# Patient Record
Sex: Male | Born: 1940
Health system: Southern US, Community
[De-identification: ages and names within clinical notes are randomized; demographics above are authoritative.]

## PROBLEM LIST (undated history)

## (undated) DIAGNOSIS — D649 Anemia, unspecified: Secondary | ICD-10-CM

## (undated) DIAGNOSIS — I2699 Other pulmonary embolism without acute cor pulmonale: Secondary | ICD-10-CM

## (undated) DIAGNOSIS — J449 Chronic obstructive pulmonary disease, unspecified: Secondary | ICD-10-CM

## (undated) DIAGNOSIS — E7800 Pure hypercholesterolemia, unspecified: Secondary | ICD-10-CM

## (undated) DIAGNOSIS — C679 Malignant neoplasm of bladder, unspecified: Secondary | ICD-10-CM

## (undated) DIAGNOSIS — I10 Essential (primary) hypertension: Secondary | ICD-10-CM

## (undated) DIAGNOSIS — I499 Cardiac arrhythmia, unspecified: Secondary | ICD-10-CM

## (undated) DIAGNOSIS — M1990 Unspecified osteoarthritis, unspecified site: Secondary | ICD-10-CM

## (undated) HISTORY — PX: CRANIOTOMY: SHX93

## (undated) HISTORY — PX: BLADDER SURGERY: SHX569

## (undated) HISTORY — DX: Anemia, unspecified: D64.9

## (undated) HISTORY — PX: DG KNEE LEFT COMPLETE (ARMC HX): HXRAD1556

---

## 1999-04-21 ENCOUNTER — Emergency Department (HOSPITAL_COMMUNITY): Admission: EM | Admit: 1999-04-21 | Discharge: 1999-04-21 | Payer: Self-pay | Admitting: Emergency Medicine

## 1999-04-21 ENCOUNTER — Encounter: Payer: Self-pay | Admitting: Emergency Medicine

## 2007-11-14 ENCOUNTER — Encounter: Admission: RE | Admit: 2007-11-14 | Discharge: 2007-11-14 | Payer: Self-pay | Admitting: Rheumatology

## 2008-01-03 ENCOUNTER — Inpatient Hospital Stay (HOSPITAL_COMMUNITY): Admission: RE | Admit: 2008-01-03 | Discharge: 2008-01-08 | Payer: Self-pay | Admitting: Orthopaedic Surgery

## 2008-03-30 ENCOUNTER — Emergency Department (HOSPITAL_BASED_OUTPATIENT_CLINIC_OR_DEPARTMENT_OTHER): Admission: EM | Admit: 2008-03-30 | Discharge: 2008-03-30 | Payer: Self-pay | Admitting: Emergency Medicine

## 2008-09-07 ENCOUNTER — Ambulatory Visit: Payer: Self-pay | Admitting: Diagnostic Radiology

## 2008-09-07 ENCOUNTER — Emergency Department (HOSPITAL_BASED_OUTPATIENT_CLINIC_OR_DEPARTMENT_OTHER): Admission: EM | Admit: 2008-09-07 | Discharge: 2008-09-07 | Payer: Self-pay | Admitting: Emergency Medicine

## 2010-02-15 ENCOUNTER — Ambulatory Visit: Payer: Self-pay | Admitting: Diagnostic Radiology

## 2010-02-15 ENCOUNTER — Emergency Department (HOSPITAL_BASED_OUTPATIENT_CLINIC_OR_DEPARTMENT_OTHER): Admission: EM | Admit: 2010-02-15 | Discharge: 2010-02-15 | Payer: Self-pay | Admitting: Emergency Medicine

## 2010-09-17 LAB — BASIC METABOLIC PANEL
BUN: 14 mg/dL (ref 6–23)
CO2: 27 mEq/L (ref 19–32)
Calcium: 9.4 mg/dL (ref 8.4–10.5)
Chloride: 104 mEq/L (ref 96–112)
Creatinine, Ser: 1.2 mg/dL (ref 0.4–1.5)
GFR calc Af Amer: >60 mL/min (ref >60)
GFR calc non Af Amer: >60 mL/min (ref >60)
Glucose, Bld: 99 mg/dL (ref 70–99)
Potassium: 4.2 mEq/L (ref 3.5–5.1)
Sodium: 140 mEq/L (ref 135–145)

## 2010-09-17 LAB — DIFFERENTIAL
Basophils Absolute: 0 10*3/uL (ref 0.0–0.1)
Basophils Relative: 0 % (ref 0–1)
Eosinophils Absolute: 0.1 10*3/uL (ref 0.0–0.7)
Eosinophils Relative: 2 % (ref 0–5)
Lymphocytes Relative: 9 % — ABNORMAL LOW (ref 12–46)
Lymphs Abs: 0.7 10*3/uL (ref 0.7–4.0)
Monocytes Absolute: 0.6 10*3/uL (ref 0.1–1.0)
Monocytes Relative: 8 % (ref 3–12)
Neutro Abs: 6.5 10*3/uL (ref 1.7–7.7)
Neutrophils Relative %: 82 % — ABNORMAL HIGH (ref 43–77)

## 2010-09-17 LAB — CBC
HCT: 43.2 % (ref 39.0–52.0)
Hemoglobin: 15 g/dL (ref 13.0–17.0)
MCH: 31.5 pg (ref 26.0–34.0)
MCHC: 34.8 g/dL (ref 30.0–36.0)
MCV: 90.4 fL (ref 78.0–100.0)
Platelets: 180 10*3/uL (ref 150–400)
RBC: 4.78 MIL/uL (ref 4.22–5.81)
RDW: 13.3 % (ref 11.5–15.5)
WBC: 7.9 10*3/uL (ref 4.0–10.5)

## 2010-09-17 LAB — POCT CARDIAC MARKERS
CKMB, poc: 2.8 ng/mL (ref 1.0–8.0)
Myoglobin, poc: 384 ng/mL (ref 12–200)
Troponin i, poc: <0.05 ng/mL (ref 0.00–0.09)
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
## 2010-11-16 NOTE — Op Note (Signed)
NAMENEVIN, GRIZZLE                ACCOUNT NO.:  [id number]   MEDICAL RECORD NO.:  [id number]          PATIENT TYPE:  INP   LOCATION:  5001                         FACILITY:  [facility]   PHYSICIAN:  Claude Manges. Whitfield, M.D.DATE OF BIRTH:  Nov 20, 1940   DATE OF PROCEDURE:  01/03/2008  DATE OF DISCHARGE:                               OPERATIVE REPORT   PREOPERATIVE DIAGNOSIS:  Rheumatoid arthritis/osteoarthritis, left knee  (end-stage).   POSTOPERATIVE DIAGNOSIS:  Rheumatoid arthritis/osteoarthritis, left knee  (end-stage).   PROCEDURE:  Left total knee replacement with computer navigation.   SURGEON:  Claude Manges. Cleophas Dunker, MD   ASSISTANT:  Oris Drone. Petrarca, PAC   ANESTHESIA:  General with supplemental femoral nerve block.   COMPLICATIONS:  None.   COMPONENTS:  DePuy LCS large femoral component #4, rotating keeled  tibial tray, 10-mm bridging bearing and metal backed three peg rotating  patella.  All were secured with polymethyl methacrylate.   PROCEDURE:  The patient was met in the preoperative holding area.  Any  questions were answered.  The left lower extremity was marked as the  appropriate operative extremity.   The patient was then taken to room #4 where he was placed under general  orotracheal anesthesia without difficulty.  Nursing staff inserted a  Foley catheter.  The urine was clear.   Tourniquet was applied to the left lower extremity.  It was then prepped  with Betadine scrub and DuraPrep and the tourniquet to the midfoot.  Sterile draping was performed.  With the extremity still elevated, it  was Esmarch exsanguinated with a proximal tourniquet at 350 mmHg.   A midline longitudinal incision was made centered about the patella  extending from the superior pouch to the tibial tubercle via a sharp  dissection.  Incision was carried down to the subcutaneous tissue.  The  first layer of capsule was incised in the midline and medial  parapatellar incision was then made  with a Bovie.  There was a clear  yellow joint effusion.   Patella was everted 180 degrees and knee flexed to 90 degrees.  There  was complete absence of articular cartilage in the majority of the  medial and lateral femoral condyle as well as the both tibial plateaus.  There was diffuse rheumatoid-appearing synovitis.  Synovectomy was  performed.  A rongeur was used to remove the osteophytes.  I did  template a large femoral component.  This was confirmed intraoperatively  with a trial component as well as with the computer.  We also measured a  #4 tibial tray preoperatively.  This was confirmed intraoperatively.   Computer navigation was employed.  Two Shantz pins were then placed x2  in the proximal tibia and x2 in the distal femur.  The computer arrays  were then applied.   Computer was employed to obtain the normal anatomic axis as well as  morphed points on the femur and tibia.  Based on the computed points,  initial cut was made transversely in the proximal tibia where there is 8-  degree posterior angle declination.  We confirmed our cut with the  Probation officer.  The flexion-extension gaps were then applied to  obtain.   Again, based on computer determination, subsequent cuts were then made  on the femur.  Flexion-extension gaps were symmetrical at 10 mm.  Lamina  spreader was inserted into the medial and lateral compartments.  Medial  and lateral menisci were excised as well as ACL and PCL.  There were  three large osteochondral loose bodies measuring over a centimeter in  diameter removed from the posterior pouch.  Any synovitis was also  performed.  Osteotome was used to remove posterior osteophytes.   The final femoral cut was then made for tapering and for the distal  femoral holes to secure the prosthesis.   Retractor was then placed about the tibia.  There was a large 1 x 1.5 cm  posterior tibial cyst.  This was debrided, drilled, and then packed with  impacted  cancellus bone.  We measured a #4 tibial tray.  The tibial 10  plate was then applied.  Center hole was then made followed by the  keeled cut with the tibial tray in place.  A 10-mm bridging bearing was  applied followed by the trial for large femoral component.  We had  excellent fit.  We were within 1 degree of normal anatomic axis.  No  opening with varus or valgus stress, negative anterior drawer sign, and  there was about 1 degree of flexion.   The patella was then prepared by removing 12 mm of bone leaving 13 mm of  patella thickness.  Three pegged jig was applied to make the three holes  secure the patella.  The trial patella was applied through full range of  motion and remained in the groove.   The trial components were removed.  The joint was copiously irrigated  with jet saline.  Marcaine with epinephrine was injected into the deep  capsule.  Final components were then carefully impacted with polymethyl  methacrylate.  We initially applied the tibia followed by the femur and  then the patella and the 10-mm bridging bearing any extraneous  methacrylate was removed.  With the knee in extension, we applied  FloSeal to the wound for postoperative hemostasis.  At 10 minutes, the  tourniquet was deflated.  Any small bleeders were Bovie coagulated with  a very nice dry feel.  The wound was then gently irrigated with bulb  saline.   The deep capsule was closed with interrupted #1 Ethibond.  Superficial  capsule closed with a running 0 Vicryl and subcu with 0 and 2-0 Vicryl.  Skin closed with skin clips.  Sterile bulky dressing was applied  followed by the patient's compressive stocking.   The patient tolerated the procedure without complications.      Claude Manges. Cleophas Dunker, M.D.  Electronically Signed     PWW/MEDQ  D:  01/03/2008  T:  01/04/2008  Job:  [id number]

## 2011-03-31 LAB — URINALYSIS, ROUTINE W REFLEX MICROSCOPIC
Bilirubin Urine: NEGATIVE
Bilirubin Urine: NEGATIVE
Glucose, UA: NEGATIVE
Glucose, UA: NEGATIVE
Hgb urine dipstick: NEGATIVE
Hgb urine dipstick: NEGATIVE
Ketones, ur: NEGATIVE
Ketones, ur: NEGATIVE
Nitrite: NEGATIVE
Nitrite: NEGATIVE
Protein, ur: NEGATIVE
Protein, ur: NEGATIVE
Specific Gravity, Urine: 1.017
Specific Gravity, Urine: >1.046 — ABNORMAL HIGH
Urobilinogen, UA: 0.2
Urobilinogen, UA: 1
pH: 5.5
pH: 7.5

## 2011-03-31 LAB — CROSSMATCH
ABO/RH(D): A POS
Antibody Screen: NEGATIVE

## 2011-03-31 LAB — URINE CULTURE
Colony Count: NO GROWTH
Colony Count: NO GROWTH
Culture: NO GROWTH
Culture: NO GROWTH

## 2011-03-31 LAB — BASIC METABOLIC PANEL
BUN: 10
BUN: 11
BUN: 11
BUN: 14
BUN: 14
CO2: 25
CO2: 25
CO2: 26
CO2: 29
CO2: 31
Calcium: 8.6
Calcium: 8.7
Calcium: 8.7
Calcium: 8.8
Calcium: 9.1
Chloride: 88 — ABNORMAL LOW
Chloride: 92 — ABNORMAL LOW
Chloride: 95 — ABNORMAL LOW
Chloride: 97
Chloride: 98
Creatinine, Ser: 0.95
Creatinine, Ser: 0.95
Creatinine, Ser: 1.02
Creatinine, Ser: 1.03
Creatinine, Ser: 1.11
GFR calc Af Amer: >60
GFR calc Af Amer: >60
GFR calc Af Amer: >60
GFR calc Af Amer: >60
GFR calc Af Amer: >60
GFR calc non Af Amer: >60
GFR calc non Af Amer: >60
GFR calc non Af Amer: >60
GFR calc non Af Amer: >60
GFR calc non Af Amer: >60
Glucose, Bld: 116 — ABNORMAL HIGH
Glucose, Bld: 118 — ABNORMAL HIGH
Glucose, Bld: 123 — ABNORMAL HIGH
Glucose, Bld: 148 — ABNORMAL HIGH
Glucose, Bld: 160 — ABNORMAL HIGH
Potassium: 3.5
Potassium: 3.7
Potassium: 4.1
Potassium: 4.1
Potassium: 4.4
Sodium: 122 — ABNORMAL LOW
Sodium: 124 — ABNORMAL LOW
Sodium: 131 — ABNORMAL LOW
Sodium: 133 — ABNORMAL LOW
Sodium: 135

## 2011-03-31 LAB — CBC
HCT: 45.2
HCT: 26.3 — ABNORMAL LOW
HCT: 27.8 — ABNORMAL LOW
HCT: 28.3 — ABNORMAL LOW
HCT: 32.3 — ABNORMAL LOW
HCT: 37.4 — ABNORMAL LOW
Hemoglobin: 15.7
Hemoglobin: 10 — ABNORMAL LOW
Hemoglobin: 11.4 — ABNORMAL LOW
Hemoglobin: 13.1
Hemoglobin: 9.2 — ABNORMAL LOW
Hemoglobin: 9.8 — ABNORMAL LOW
MCHC: 34.8
MCHC: 35
MCHC: 35.1
MCHC: 35.2
MCHC: 35.3
MCHC: 35.5
MCV: 89.6
MCV: 89
MCV: 89
MCV: 89.3
MCV: 89.7
MCV: 89.8
Platelets: 233
Platelets: 185
Platelets: 197
Platelets: 231
Platelets: 250
Platelets: 265
RBC: 5.05
RBC: 2.93 — ABNORMAL LOW
RBC: 3.12 — ABNORMAL LOW
RBC: 3.17 — ABNORMAL LOW
RBC: 3.6 — ABNORMAL LOW
RBC: 4.21 — ABNORMAL LOW
RDW: 13.6
RDW: 13.5
RDW: 13.5
RDW: 13.7
RDW: 13.8
RDW: 14.2
WBC: 7
WBC: 11.9 — ABNORMAL HIGH
WBC: 14.3 — ABNORMAL HIGH
WBC: 14.3 — ABNORMAL HIGH
WBC: 7.4
WBC: 8.6

## 2011-03-31 LAB — DIFFERENTIAL
Basophils Absolute: 0
Basophils Relative: 0
Eosinophils Absolute: 0.1
Eosinophils Relative: 2
Lymphocytes Relative: 27
Lymphs Abs: 1.9
Monocytes Absolute: 0.5
Monocytes Relative: 8
Neutro Abs: 4.4
Neutrophils Relative %: 63

## 2011-03-31 LAB — PROTIME-INR
INR: 0.9
INR: 1.1
INR: 1.9 — ABNORMAL HIGH
INR: 2.1 — ABNORMAL HIGH
INR: 2.2 — ABNORMAL HIGH
INR: 2.3 — ABNORMAL HIGH
Prothrombin Time: 12.5
Prothrombin Time: 14.5
Prothrombin Time: 22.1 — ABNORMAL HIGH
Prothrombin Time: 24.4 — ABNORMAL HIGH
Prothrombin Time: 24.8 — ABNORMAL HIGH
Prothrombin Time: 25.6 — ABNORMAL HIGH

## 2011-03-31 LAB — ABO/RH: ABO/RH(D): A POS

## 2011-03-31 LAB — COMPREHENSIVE METABOLIC PANEL
ALT: 22
AST: 22
Albumin: 3.9
Alkaline Phosphatase: 69
BUN: 11
CO2: 29
Calcium: 9.9
Chloride: 104
Creatinine, Ser: 1.13
GFR calc Af Amer: >60
GFR calc non Af Amer: >60
Glucose, Bld: 95
Potassium: 4.8
Sodium: 140
Total Bilirubin: 0.9
Total Protein: 7

## 2011-03-31 LAB — URINE MICROSCOPIC-ADD ON

## 2011-03-31 LAB — APTT: aPTT: 28

## 2011-03-31 LAB — SODIUM: Sodium: 125 — ABNORMAL LOW

## 2014-07-06 ENCOUNTER — Encounter (HOSPITAL_BASED_OUTPATIENT_CLINIC_OR_DEPARTMENT_OTHER): Payer: Self-pay

## 2014-07-06 ENCOUNTER — Emergency Department (HOSPITAL_BASED_OUTPATIENT_CLINIC_OR_DEPARTMENT_OTHER): Payer: Medicare HMO

## 2014-07-06 ENCOUNTER — Emergency Department (HOSPITAL_BASED_OUTPATIENT_CLINIC_OR_DEPARTMENT_OTHER)
Admission: EM | Admit: 2014-07-06 | Discharge: 2014-07-06 | Disposition: A | Discharge status: Home / Self Care | Payer: Medicare HMO | Attending: Emergency Medicine | Admitting: Emergency Medicine

## 2014-07-06 DIAGNOSIS — M549 Dorsalgia, unspecified: Secondary | ICD-10-CM | POA: Diagnosis present

## 2014-07-06 DIAGNOSIS — I10 Essential (primary) hypertension: Secondary | ICD-10-CM | POA: Diagnosis not present

## 2014-07-06 DIAGNOSIS — M5136 Other intervertebral disc degeneration, lumbar region: Secondary | ICD-10-CM | POA: Insufficient documentation

## 2014-07-06 DIAGNOSIS — Z87891 Personal history of nicotine dependence: Secondary | ICD-10-CM | POA: Diagnosis not present

## 2014-07-06 DIAGNOSIS — J449 Chronic obstructive pulmonary disease, unspecified: Secondary | ICD-10-CM | POA: Diagnosis not present

## 2014-07-06 DIAGNOSIS — E780 Pure hypercholesterolemia: Secondary | ICD-10-CM | POA: Insufficient documentation

## 2014-07-06 DIAGNOSIS — Z79899 Other long term (current) drug therapy: Secondary | ICD-10-CM | POA: Insufficient documentation

## 2014-07-06 DIAGNOSIS — R52 Pain, unspecified: Secondary | ICD-10-CM

## 2014-07-06 DIAGNOSIS — M479 Spondylosis, unspecified: Secondary | ICD-10-CM

## 2014-07-06 HISTORY — DX: Pure hypercholesterolemia, unspecified: E78.00

## 2014-07-06 HISTORY — DX: Essential (primary) hypertension: I10

## 2014-07-06 HISTORY — DX: Chronic obstructive pulmonary disease, unspecified: J44.9

## 2014-07-06 MED ORDER — OXYCODONE-ACETAMINOPHEN 7.5-325 MG PO TABS: 1.0000 | ORAL_TABLET | ORAL | Status: DC | PRN

## 2014-07-06 NOTE — ED Provider Notes (Signed)
CSN: [id number]     Arrival date & time 07/06/14  1629 History  This chart was scribed for Dot Lanes, MD by Lowella Petties, ED Scribe. The patient was seen in room MHT13/MHT13. Patient's care was started at 6:06 PM.   Chief Complaint  Patient presents with  . Back Pain   The history is provided by the patient. No language interpreter was used.   HPI Comments: Victor Cooper is a 74 y.o. male who presents to the Emergency Department complaining of constant, moderate, back pain which began yesterday evening. He states that the pain is localized to "one little spot" in his left lower back. He states that the pain is exacerbated by any change in position. He states that the pain can be relieved by laying standing in a certain position. He denies any injury. He reports taking 3 Advil twice since last night without relief. He denies any history of kidney problems.   PCP: Dr. Charissa Bash with [facility]  Past Medical History  Diagnosis Date  . Hypertension   . High cholesterol   . COPD (chronic obstructive pulmonary disease)    History reviewed. No pertinent past surgical history. No family history on file. History  Substance Use Topics  . Smoking status: Former Research scientist (life sciences)  . Smokeless tobacco: Not on file  . Alcohol Use: Not on file    Review of Systems  Musculoskeletal: Positive for back pain.  All other systems reviewed and are negative.   Allergies  Review of patient's allergies indicates no known allergies.  Home Medications   Prior to Admission medications   Medication Sig Start Date End Date Taking? Authorizing Provider  olmesartan (BENICAR) 20 MG tablet Take 20 mg by mouth daily.   Yes Historical Provider, MD  rosuvastatin (CRESTOR) 40 MG tablet Take 40 mg by mouth daily. 3 times/week   Yes Historical Provider, MD  oxyCODONE-acetaminophen (PERCOCET) 7.5-325 MG per tablet Take 1 tablet by mouth every 4 (four) hours as needed for pain. 07/06/14   Dot Lanes,  MD   Triage Vitals: BP 164/81 mmHg  Pulse 69  Temp(Src) 98.2 F (36.8 C) (Oral)  Resp 20  SpO2 99% Physical Exam  Constitutional: He is oriented to person, place, and time. He appears well-developed and well-nourished. No distress.  HENT:  Head: Normocephalic and atraumatic.  Eyes: Pupils are equal, round, and reactive to light.  Neck: Normal range of motion.  Cardiovascular: Normal rate and intact distal pulses.   Pulmonary/Chest: No respiratory distress.  Abdominal: Normal appearance. He exhibits no distension.  Musculoskeletal: Normal range of motion. He exhibits tenderness.       Back:  Neurological: He is alert and oriented to person, place, and time. No cranial nerve deficit.  Skin: Skin is warm and dry. No rash noted.  Psychiatric: He has a normal mood and affect. His behavior is normal.  Nursing note and vitals reviewed.   ED Course  Procedures (including critical care time) DIAGNOSTIC STUDIES: Oxygen Saturation is 99% on room air, normal by my interpretation.    COORDINATION OF CARE: 6:11 PM-Discussed treatment plan which includes OTC Ibuprofen, pain medication, f/u w/ PCP with pt at bedside and pt agreed to plan.   Labs Review Labs Reviewed - No data to display  Imaging Review Dg Lumbar Spine Complete  07/06/2014   CLINICAL DATA:  One day history of low back pain radiating into the left posterior thigh. Remote injury in 2009. No recent injuries.  EXAM: LUMBAR  SPINE - COMPLETE 4+ VIEW  COMPARISON:  None.  FINDINGS: Five non rib-bearing lumbar vertebrae. Grade 1 spondylolisthesis of L4 on L5 approximating 6 mm due to severe facet degenerative changes at this level. No acute or remote fractures. Marked disc space narrowing at L4-5 and L5-S1. Moderate disc space narrowing at L2-3 and L3-4. Anterior bridging osteophytes at multiple levels. Facet degenerative changes bilaterally at L5-S1 in addition to L4-5. Lower thoracic spondylosis. Visualized sacroiliac joints intact.  Aortoiliac atherosclerosis without aneurysm.  IMPRESSION: 1. No evidence of acute or remote fractures. 2. Degenerative grade 1 spondylolisthesis of L4 on L5. 3. Severe degenerative disc disease and spondylosis at L4-5 and L5-S1. Moderate degenerative disc disease and spondylosis at L2-3 and L3-4.   Electronically Signed   By: Evangeline Dakin M.D.   On: 07/06/2014 17:36      MDM   Final diagnoses:  Pain  Degenerative joint disease of low back     I personally performed the services described in this documentation, which was scribed in my presence. The recorded information has been reviewed and considered.    Dot Lanes, MD 07/06/14 [id number]

## 2014-07-06 NOTE — ED Notes (Signed)
Patient transported to X-ray 

## 2014-07-06 NOTE — Discharge Instructions (Signed)

## 2014-07-06 NOTE — ED Notes (Signed)
Patient here with lower back pain x 1 day, pain with any change in position, denies injury

## 2015-10-04 ENCOUNTER — Emergency Department (HOSPITAL_BASED_OUTPATIENT_CLINIC_OR_DEPARTMENT_OTHER)
Admission: EM | Admit: 2015-10-04 | Discharge: 2015-10-05 | Disposition: A | Discharge status: Home / Self Care | Payer: Medicare HMO | Attending: Emergency Medicine | Admitting: Emergency Medicine

## 2015-10-04 ENCOUNTER — Encounter (HOSPITAL_BASED_OUTPATIENT_CLINIC_OR_DEPARTMENT_OTHER): Payer: Self-pay | Admitting: Emergency Medicine

## 2015-10-04 DIAGNOSIS — M25512 Pain in left shoulder: Secondary | ICD-10-CM | POA: Insufficient documentation

## 2015-10-04 DIAGNOSIS — I10 Essential (primary) hypertension: Secondary | ICD-10-CM | POA: Insufficient documentation

## 2015-10-04 DIAGNOSIS — M62838 Other muscle spasm: Secondary | ICD-10-CM

## 2015-10-04 DIAGNOSIS — J449 Chronic obstructive pulmonary disease, unspecified: Secondary | ICD-10-CM | POA: Insufficient documentation

## 2015-10-04 DIAGNOSIS — Z87891 Personal history of nicotine dependence: Secondary | ICD-10-CM | POA: Insufficient documentation

## 2015-10-04 DIAGNOSIS — Z79899 Other long term (current) drug therapy: Secondary | ICD-10-CM | POA: Diagnosis not present

## 2015-10-04 DIAGNOSIS — E7800 Pure hypercholesterolemia, unspecified: Secondary | ICD-10-CM | POA: Diagnosis not present

## 2015-10-04 DIAGNOSIS — M542 Cervicalgia: Secondary | ICD-10-CM | POA: Diagnosis present

## 2015-10-04 NOTE — ED Provider Notes (Signed)
TIME SEEN: 11:59 PM  CHIEF COMPLAINT:  Chief Complaint  Patient presents with  . Neck Pain   HPI:  HPI Comments: Victor Cooper is a 75 y.o. male who is right hand dominant, with a pmhx of degenerative disk disease, HTN, HLD, and COPD, who presents to the Emergency Department complaining of constant, left sided neck pain with radiation to under the left arm that began last night and woke him up around 4AM. He states that he almost fell down when he tried to wake up because of the severe pain. Pain is exacerbated by movementAnd palpation. Pt states no ameliorating factors. Pt has tried muscle sprays, and ibuprofen with no relief. Pt states that he may have slept on it incorrectly to cause the muscle pain. Pt's family also states that he has been picking up his grandchildren, which may cause muscle strains. Pt is currently ambulatory. Pt denies any tingling, numbness or focal weakness, CP or chest discomfort, SOB, nausea, vomiting, diaphoresis, fever, cough, or recent injury to the left arm.   ROS: See HPI Constitutional: no fever  Eyes: no drainage  ENT: no runny nose   Cardiovascular:  no chest pain  Resp: no SOB  GI: no vomiting GU: no dysuria Integumentary: no rash  Allergy: no hives  Musculoskeletal: no leg swelling. Neck pain. Left arm pain.  Neurological: no slurred speech ROS otherwise negative  PAST MEDICAL HISTORY/PAST SURGICAL HISTORY:  Past Medical History  Diagnosis Date  . Hypertension   . High cholesterol   . COPD (chronic obstructive pulmonary disease) (HCC)     MEDICATIONS:  Prior to Admission medications   Medication Sig Start Date End Date Taking? Authorizing Provider  olmesartan (BENICAR) 20 MG tablet Take 20 mg by mouth daily.   Yes Historical Provider, MD  rosuvastatin (CRESTOR) 40 MG tablet Take 40 mg by mouth daily. 3 times/week   Yes Historical Provider, MD  oxyCODONE-acetaminophen (PERCOCET) 7.5-325 MG per tablet Take 1 tablet by mouth every 4 (four)  hours as needed for pain. 07/06/14   Leonard Schwartz, MD    ALLERGIES:  No Known Allergies  SOCIAL HISTORY:  Social History  Substance Use Topics  . Smoking status: Former Research scientist (life sciences)  . Smokeless tobacco: Not on file  . Alcohol Use: No    FAMILY HISTORY: No family history on file.  EXAM: BP 158/84 mmHg  Pulse 75  Temp(Src) 97.9 F (36.6 C) (Oral)  Resp 18  Ht 5' 10.5" (1.791 m)  Wt 238 lb (107.956 kg)  BMI 33.66 kg/m2  SpO2 98% CONSTITUTIONAL: Alert and oriented and responds appropriately to questions. Well-appearing; well-nourished. Elderly.  HEAD: Normocephalic EYES: Conjunctivae clear, PERRL ENT: normal nose; no rhinorrhea; moist mucous membranes NECK: Supple, no meningismus, no LAD. No midline spinal tenderness, step-off, or deformity.  CARD: RRR; S1 and S2 appreciated; no murmurs, no clicks, no rubs, no gallops RESP: Normal chest excursion without splinting or tachypnea; breath sounds clear and equal bilaterally; no wheezes, no rhonchi, no rales, no hypoxia or respiratory distress, speaking full sentences ABD/GI: Normal bowel sounds; non-distended; soft, non-tender, no rebound, no guarding, no peritoneal signs BACK:  The back appears normal and is non-tender to palpation, there is no CVA tenderness. No midline spinal tenderness, step-off, or deformity.  EXT: Has a left trapezius muscle spasm with associated tenderness. Unable to lift the shoulder past 90 degrees without pain. No joint effusion, no erythema, no warmth. No bony deformity or loss of fullness. Compartments are soft. Sensation to light tough intact  throughout the left arm. 2+ left radial pulse and normal left grip strength. Joints are otherwise Normal ROM in all joints; otherwise non-tender toj palpation; no edema; normal capillary refill; no cyanosis, no calf tenderness or swelling  SKIN: Normal color for age and race; warm; no rash NEURO: Moves all extremities equally, sensation to light touch intact diffusely,  cranial nerves II through XII intact PSYCH: The patient's mood and manner are appropriate. Grooming and personal hygiene are appropriate.  MEDICAL DECISION MAKING: Patient here with left shoulder pain. He has a trapezius muscle spasm and may have some rotator cuff tendinitis. No signs of gout, septic arthritis. Neurovascular intact distally. No injury but given his age we will obtain an x-ray. I do not think that this is ACS. He is having no chest pain, shortness of breath, nausea, diaphoresis or dizziness. Pain is completely reproducible with movement and palpation. No midline spinal tenderness or neurologic deficit. I do not think this is radicular in nature. Will give Vicodin and reassess.  ED PROGRESS: Patient reports his pain is better. X-ray of the shoulder shows no fracture dislocation. He does have degenerative changes. Have recommended alternating heat and ice and stretching this area. We'll discharge with prescription for Vicodin. Have advised him that this may make him drowsy so he should take it only as needed. He does not drink or drive.  He has a PCP for follow-up.     At this time, I do not feel there is any life-threatening condition present. I have reviewed and discussed all results (EKG, imaging, lab, urine as appropriate), exam findings with patient. I have reviewed nursing notes and appropriate previous records.  I feel the patient is safe to be discharged home without further emergent workup. Discussed usual and customary return precautions. Patient and family (if present) verbalize understanding and are comfortable with this plan.  Patient will follow-up with their primary care provider. If they do not have a primary care provider, information for follow-up has been provided to them. All questions have been answered.  I personally performed the services described in this documentation, which was scribed in my presence. The recorded information has been reviewed and is  accurate.   [place], DO 10/05/15 [phone]

## 2015-10-04 NOTE — ED Notes (Signed)
Pt c/o left sided neck pain with rad under the left arm. Denies chest pain or injury. Pt ambulatory at triage NAD.

## 2015-10-05 ENCOUNTER — Emergency Department (HOSPITAL_BASED_OUTPATIENT_CLINIC_OR_DEPARTMENT_OTHER): Payer: Medicare HMO

## 2015-10-05 MED ORDER — HYDROCODONE-ACETAMINOPHEN 5-325 MG PO TABS: 1.0000 | ORAL_TABLET | Freq: Four times a day (QID) | ORAL | Status: DC | PRN

## 2015-10-05 MED ORDER — HYDROCODONE-ACETAMINOPHEN 5-325 MG PO TABS
2.0000 | ORAL_TABLET | Freq: Once | ORAL | Status: AC
  Administered 2015-10-05: 2 via ORAL
  Filled 2015-10-05: qty 2

## 2015-10-05 NOTE — ED Notes (Signed)
No changes, family x3 at [facility], to xray via stretcher.

## 2015-10-05 NOTE — ED Notes (Signed)
Pt alert, NAD, calm, interactive, resps e/u, speaking in clear complete sentences, no dyspnea noted, family x3 at [facility], Dr. Leonides Schanz into room.

## 2015-10-05 NOTE — Discharge Instructions (Signed)
Heat Therapy Heat therapy can help ease sore, stiff, injured, and tight muscles and joints. Heat relaxes your muscles, which may help ease your pain.  RISKS AND COMPLICATIONS If you have any of the following conditions, do not use heat therapy unless your health care provider has approved:  Poor circulation.  Healing wounds or scarred skin in the area being treated.  Diabetes, heart disease, or high blood pressure.  Not being able to feel (numbness) the area being treated.  Unusual swelling of the area being treated.  Active infections.  Blood clots.  Cancer.  Inability to communicate pain. This may include young children and people who have problems with their brain function (dementia).  Pregnancy. Heat therapy should only be used on old, pre-existing, or long-lasting (chronic) injuries. Do not use heat therapy on new injuries unless directed by your health care provider. HOW TO USE HEAT THERAPY There are several different kinds of heat therapy, including:  Moist heat pack.  Warm water bath.  Hot water bottle.  Electric heating pad.  Heated gel pack.  Heated wrap.  Electric heating pad. Use the heat therapy method suggested by your health care provider. Follow your health care provider's instructions on when and how to use heat therapy. GENERAL HEAT THERAPY RECOMMENDATIONS  Do not sleep while using heat therapy. Only use heat therapy while you are awake.  Your skin may turn pink while using heat therapy. Do not use heat therapy if your skin turns red.  Do not use heat therapy if you have new pain.  High heat or long exposure to heat can cause burns. Be careful when using heat therapy to avoid burning your skin.  Do not use heat therapy on areas of your skin that are already irritated, such as with a rash or sunburn. SEEK MEDICAL CARE IF:  You have blisters, redness, swelling, or numbness.  You have new pain.  Your pain is worse. MAKE SURE  YOU:  Understand these instructions.  Will watch your condition.  Will get help right away if you are not doing well or get worse.   This information is not intended to replace advice given to you by your health care provider. Make sure you discuss any questions you have with your health care provider.   Document Released: 09/12/2011 Document Revised: 07/11/2014 Document Reviewed: 08/13/2013 Elsevier Interactive Patient Education 2016 Elsevier Inc[employer]  Shoulder Pain The shoulder is the joint that connects your arms to your body. The bones that form the shoulder joint include the upper arm bone (humerus), the shoulder blade (scapula), and the collarbone (clavicle). The top of the humerus is shaped like a ball and fits into a rather flat socket on the scapula (glenoid cavity). A combination of muscles and strong, fibrous tissues that connect muscles to bones (tendons) support your shoulder joint and hold the ball in the socket. Small, fluid-filled sacs (bursae) are located in different areas of the joint. They act as cushions between the bones and the overlying soft tissues and help reduce friction between the gliding tendons and the bone as you move your arm. Your shoulder joint allows a wide range of motion in your arm. This range of motion allows you to do things like scratch your back or throw a ball. However, this range of motion also makes your shoulder more prone to pain from overuse and injury. Causes of shoulder pain can originate from both injury and overuse and usually can be grouped in the following four categories:  Redness,  swelling, and pain (inflammation) of the tendon (tendinitis) or the bursae (bursitis).  Instability, such as a dislocation of the joint.  Inflammation of the joint (arthritis).  Broken bone (fracture). HOME CARE INSTRUCTIONS   Apply ice to the sore area.  Put ice in a plastic bag.  Place a towel between your skin and the bag.  Leave the ice on for 15-20  minutes, 3-4 times per day for the first 2 days, or as directed by your health care provider.  Stop using cold packs if they do not help with the pain.  If you have a shoulder sling or immobilizer, wear it as long as your caregiver instructs. Only remove it to shower or bathe. Move your arm as little as possible, but keep your hand moving to prevent swelling.  Squeeze a soft ball or foam pad as much as possible to help prevent swelling.  Only take over-the-counter or prescription medicines for pain, discomfort, or fever as directed by your caregiver. SEEK MEDICAL CARE IF:   Your shoulder pain increases, or new pain develops in your arm, hand, or fingers.  Your hand or fingers become cold and numb.  Your pain is not relieved with medicines. SEEK IMMEDIATE MEDICAL CARE IF:   Your arm, hand, or fingers are numb or tingling.  Your arm, hand, or fingers are significantly swollen or turn white or blue. MAKE SURE YOU:   Understand these instructions.  Will watch your condition.  Will get help right away if you are not doing well or get worse.   This information is not intended to replace advice given to you by your health care provider. Make sure you discuss any questions you have with your health care provider.   Document Released: 03/30/2005 Document Revised: 07/11/2014 Document Reviewed: 10/13/2014 Elsevier Interactive Patient Education 2016 Elsevier Inc. Muscle Cramps and Spasms Muscle cramps and spasms occur when a muscle or muscles tighten and you have no control over this tightening (involuntary muscle contraction). They are a common problem and can develop in any muscle. The most common place is in the calf muscles of the leg. Both muscle cramps and muscle spasms are involuntary muscle contractions, but they also have differences:   Muscle cramps are sporadic and painful. They may last a few seconds to a quarter of an hour. Muscle cramps are often more forceful and last longer  than muscle spasms.  Muscle spasms may or may not be painful. They may also last just a few seconds or much longer. CAUSES  It is uncommon for cramps or spasms to be due to a serious underlying problem. In many cases, the cause of cramps or spasms is unknown. Some common causes are:   Overexertion.   Overuse from repetitive motions (doing the same thing over and over).   Remaining in a certain position for a long period of time.   Improper preparation, form, or technique while performing a sport or activity.   Dehydration.   Injury.   Side effects of some medicines.   Abnormally low levels of the salts and ions in your blood (electrolytes), especially potassium and calcium. This could happen if you are taking water pills (diuretics) or you are pregnant.  Some underlying medical problems can make it more likely to develop cramps or spasms. These include, but are not limited to:   Diabetes.   Parkinson disease.   Hormone disorders, such as thyroid problems.   Alcohol abuse.   Diseases specific to muscles, joints, and bones.  Blood vessel disease where not enough blood is getting to the muscles.  HOME CARE INSTRUCTIONS   Stay well hydrated. Drink enough water and fluids to keep your urine clear or pale yellow.  It may be helpful to massage, stretch, and relax the affected muscle.  For tight or tense muscles, use a warm towel, heating pad, or hot shower water directed to the affected area.  If you are sore or have pain after a cramp or spasm, applying ice to the affected area may relieve discomfort.  Put ice in a plastic bag.  Place a towel between your skin and the bag.  Leave the ice on for 15-20 minutes, 03-04 times a day.  Medicines used to treat a known cause of cramps or spasms may help reduce their frequency or severity. Only take over-the-counter or prescription medicines as directed by your caregiver. SEEK MEDICAL CARE IF:  Your cramps or spasms  get more severe, more frequent, or do not improve over time.  MAKE SURE YOU:   Understand these instructions.  Will watch your condition.  Will get help right away if you are not doing well or get worse.   This information is not intended to replace advice given to you by your health care provider. Make sure you discuss any questions you have with your health care provider.   Document Released: 12/10/2001 Document Revised: 10/15/2012 Document Reviewed: 06/06/2012 Elsevier Interactive Patient Education [employer].

## 2017-10-10 ENCOUNTER — Encounter (INDEPENDENT_AMBULATORY_CARE_PROVIDER_SITE_OTHER): Payer: Self-pay | Admitting: Orthopaedic Surgery

## 2017-10-10 ENCOUNTER — Ambulatory Visit (INDEPENDENT_AMBULATORY_CARE_PROVIDER_SITE_OTHER): Payer: Medicare HMO | Admitting: Orthopaedic Surgery

## 2017-10-10 ENCOUNTER — Ambulatory Visit (INDEPENDENT_AMBULATORY_CARE_PROVIDER_SITE_OTHER): Payer: Self-pay

## 2017-10-10 VITALS — BP 152/82 | HR 60 | Resp 16 | Ht 68.5 in | Wt 228.0 lb

## 2017-10-10 DIAGNOSIS — M25561 Pain in right knee: Secondary | ICD-10-CM

## 2017-10-10 DIAGNOSIS — G8929 Other chronic pain: Secondary | ICD-10-CM

## 2017-10-10 NOTE — Progress Notes (Signed)
Office Visit Note   Patient: Victor Cooper           Date of Birth: 11/01/1940           MRN: [id number] Visit Date: 10/10/2017              Requested by: Heywood Bene, PA-C [id number] [place], [place] [address] PCP: Heywood Bene, PA-C   Assessment & Plan: Visit Diagnoses:  1. Chronic pain of right knee     Plan:  #1: Plan would be for right total knee replacement in the near future. #2: We will need to have medical clearance by Vara Guardian, as well as vascular Dr. Evangeline Gula.  I have also suggested on the clearance by Vara Guardian that a cardiac clearance would be indicated also of her choice. #3: Once these are returned we can possibly schedule him for surgery.  Follow-Up Instructions: No follow-ups on file.   Orders:  Orders Placed This Encounter  Procedures  . XR KNEE 3 VIEW RIGHT   No orders of the defined types were placed in this encounter.     Procedures: No procedures performed   Clinical Data: No additional findings.   Subjective: Chief Complaint  Patient presents with  . Right Knee - Pain  . New Patient (Initial Visit)    2009 HAD LEFT KNEE WAS TOLD NEEDED RIGHT SIDE DONE, R KNEE PAIN FOR FEW YEARS NO INJURY OR INJECTIONS    HPI  Victor Cooper is a 76 year old African-American male who presents today at the request of Victor Lundborg, PA-C at [facility] family [facility].  History is that he has had chronic right knee pain for the past 1-1/2-2 years.  He has had a previous left total knee arthroplasty by Dr. Durward Fortes in 2009 with excellent results.  He still is very happy with those results.  He has been having this constant dull aching pain with occasional sharp and shooting pains in the knee.  He is having difficulty with weightbearing.  He states his pain is a constant but worsens with activities.  He does have nighttime pain also.  He has rated his pain in the past from 7 to 10- to 10 out of 10.  His pain is aggravated by  walking and weightbearing activities.  Complains of a limp.  He also has problems with swelling of the knee.  Some symptoms of giving way is also noted by history.  Of recent note, he was admitted on 06/11/2017 for shortness of breath which was the result of a saddle pulmonary embolus that was treated with lytic therapy.  He was also noted to have arterial decreased blood flow in both lower extremities.  ABI on the right at 0.8 and on the left at 0.57.  Vascular had seen him and felt no further workup or follow-up.   Review of Systems  Constitutional: Negative for fatigue.  HENT: Negative for ear pain.   Eyes: Negative for pain.  Respiratory: Negative for cough and shortness of breath.   Cardiovascular: Positive for leg swelling.  Gastrointestinal: Negative for constipation and diarrhea.  Genitourinary: Negative for difficulty urinating.  Musculoskeletal: Positive for back pain. Negative for neck pain.  Skin: Negative for rash.  Allergic/Immunologic: Negative for food allergies.  Neurological: Positive for weakness and numbness.  Hematological: Does not bruise/bleed easily.  Psychiatric/Behavioral: Positive for sleep disturbance.     Objective: Vital Signs: BP (!) 152/82 (BP Location: Left Arm, Patient Position: Sitting, Cuff Size: Normal)  Pulse 60   Resp 16   Ht 5' 8.5" (1.74 m)   Wt 228 lb (103.4 kg)   BMI 34.16 kg/m   Physical Exam  Constitutional: He is oriented to person, place, and time. He appears well-developed and well-nourished.  HENT:  Head: Normocephalic and atraumatic.  Eyes: Pupils are equal, round, and reactive to light. EOM are normal.  Pulmonary/Chest: Effort normal.  Neurological: He is alert and oriented to person, place, and time.  Skin: Skin is warm and dry.  Psychiatric: He has a normal mood and affect. His behavior is normal. Judgment and thought content normal.    Ortho Exam  Exam today reveals fusion of the right knee.  Range of motion lacking  around 15 degrees of full extension.  Flexion to about 75-80 degrees.  Crepitance with range of motion.  Appears ligamentously stable medial and laterally.  Hip motions are equal bilaterally.  Specialty Comments:  No specialty comments available.  Imaging: Xr Knee 3 View Right  Result Date: 10/10/2017 Three-view x-ray of the right knee reveals tricompartment bone-on-bone OA.  Numerous calcific bodies noted in the suprapatellar pouch.  Periarticular spurring as well as patellar femoral joints.  Cancellation of the femur medially on the tibia.  Essentially bone-on-bone.    PMFS History: Current Outpatient Medications  Medication Sig Dispense Refill  . aspirin EC 81 MG tablet Take by mouth.    Arne Cleveland 5 MG TABS tablet     . olmesartan (BENICAR) 20 MG tablet Take 20 mg by mouth daily.    Marland Kitchen olmesartan-hydrochlorothiazide (BENICAR HCT) 40-25 MG tablet TAKE 1 TABLET BY MOUTH ONCE DAILY    . rosuvastatin (CRESTOR) 20 MG tablet     . rosuvastatin (CRESTOR) 40 MG tablet Take 40 mg by mouth daily. 3 times/week    . Tiotropium Bromide-Olodaterol 2.5-2.5 MCG/ACT AERS Inhale into the lungs.    Marland Kitchen HYDROcodone-acetaminophen (NORCO/VICODIN) 5-325 MG tablet Take 1-2 tablets by mouth every 6 (six) hours as needed. (Patient not taking: Reported on 10/10/2017) 15 tablet 0  . oxyCODONE-acetaminophen (PERCOCET) 7.5-325 MG per tablet Take 1 tablet by mouth every 4 (four) hours as needed for pain. (Patient not taking: Reported on 10/10/2017) 30 tablet 0   No current facility-administered medications for this visit.      There are no active problems to display for this patient.  Past Medical History:  Diagnosis Date  . COPD (chronic obstructive pulmonary disease) ([place])   . High cholesterol   . Hypertension     History reviewed. No pertinent family history.  History reviewed. No pertinent surgical history. Social History   Occupational History  . Not on file  Tobacco Use  . Smoking status: Former Research scientist (life sciences)   . Smokeless tobacco: Never Used  Substance and Sexual Activity  . Alcohol use: No  . Drug use: Never  . Sexual activity: Not on file

## 2019-12-18 ENCOUNTER — Telehealth: Payer: Self-pay | Admitting: Physician Assistant

## 2019-12-18 NOTE — Telephone Encounter (Signed)
Caller: Layton Naves (daughter) Call back phone number: [phone]  Demetra Shiner is wondering if you accept her father as a new patient.  Please advise.

## 2019-12-18 NOTE — Telephone Encounter (Signed)
I will take him on as a patient but make sure they know it will be awhile before he gets an appt

## 2019-12-18 NOTE — Telephone Encounter (Signed)
The daughter is a patient of yours.

## 2020-03-06 ENCOUNTER — Inpatient Hospital Stay (HOSPITAL_BASED_OUTPATIENT_CLINIC_OR_DEPARTMENT_OTHER)
Admission: EM | Admit: 2020-03-06 | Discharge: 2020-03-09 | DRG: 871 | Disposition: A | Discharge status: Home / Self Care | Payer: Medicare HMO | Attending: Internal Medicine | Admitting: Internal Medicine

## 2020-03-06 ENCOUNTER — Other Ambulatory Visit: Payer: Self-pay

## 2020-03-06 ENCOUNTER — Emergency Department (HOSPITAL_BASED_OUTPATIENT_CLINIC_OR_DEPARTMENT_OTHER): Payer: Medicare HMO

## 2020-03-06 ENCOUNTER — Encounter (HOSPITAL_BASED_OUTPATIENT_CLINIC_OR_DEPARTMENT_OTHER): Payer: Self-pay | Admitting: *Deleted

## 2020-03-06 DIAGNOSIS — Z86711 Personal history of pulmonary embolism: Secondary | ICD-10-CM

## 2020-03-06 DIAGNOSIS — E860 Dehydration: Secondary | ICD-10-CM | POA: Diagnosis present

## 2020-03-06 DIAGNOSIS — C679 Malignant neoplasm of bladder, unspecified: Secondary | ICD-10-CM

## 2020-03-06 DIAGNOSIS — Z79899 Other long term (current) drug therapy: Secondary | ICD-10-CM

## 2020-03-06 DIAGNOSIS — Z7982 Long term (current) use of aspirin: Secondary | ICD-10-CM

## 2020-03-06 DIAGNOSIS — J449 Chronic obstructive pulmonary disease, unspecified: Secondary | ICD-10-CM | POA: Diagnosis present

## 2020-03-06 DIAGNOSIS — U071 COVID-19: Secondary | ICD-10-CM | POA: Diagnosis not present

## 2020-03-06 DIAGNOSIS — R8271 Bacteriuria: Secondary | ICD-10-CM

## 2020-03-06 DIAGNOSIS — E7800 Pure hypercholesterolemia, unspecified: Secondary | ICD-10-CM | POA: Diagnosis present

## 2020-03-06 DIAGNOSIS — R0603 Acute respiratory distress: Secondary | ICD-10-CM | POA: Diagnosis present

## 2020-03-06 DIAGNOSIS — A4189 Other specified sepsis: Principal | ICD-10-CM | POA: Diagnosis present

## 2020-03-06 DIAGNOSIS — Z8551 Personal history of malignant neoplasm of bladder: Secondary | ICD-10-CM

## 2020-03-06 DIAGNOSIS — D72819 Decreased white blood cell count, unspecified: Secondary | ICD-10-CM | POA: Diagnosis present

## 2020-03-06 DIAGNOSIS — I2699 Other pulmonary embolism without acute cor pulmonale: Secondary | ICD-10-CM | POA: Diagnosis not present

## 2020-03-06 DIAGNOSIS — R7401 Elevation of levels of liver transaminase levels: Secondary | ICD-10-CM | POA: Diagnosis present

## 2020-03-06 DIAGNOSIS — E871 Hypo-osmolality and hyponatremia: Secondary | ICD-10-CM | POA: Diagnosis present

## 2020-03-06 DIAGNOSIS — E785 Hyperlipidemia, unspecified: Secondary | ICD-10-CM | POA: Diagnosis present

## 2020-03-06 DIAGNOSIS — Z7901 Long term (current) use of anticoagulants: Secondary | ICD-10-CM

## 2020-03-06 DIAGNOSIS — R6520 Severe sepsis without septic shock: Secondary | ICD-10-CM | POA: Diagnosis present

## 2020-03-06 DIAGNOSIS — J440 Chronic obstructive pulmonary disease with acute lower respiratory infection: Secondary | ICD-10-CM | POA: Diagnosis present

## 2020-03-06 DIAGNOSIS — Z87891 Personal history of nicotine dependence: Secondary | ICD-10-CM

## 2020-03-06 DIAGNOSIS — R748 Abnormal levels of other serum enzymes: Secondary | ICD-10-CM | POA: Diagnosis present

## 2020-03-06 DIAGNOSIS — R0902 Hypoxemia: Secondary | ICD-10-CM | POA: Diagnosis present

## 2020-03-06 DIAGNOSIS — R251 Tremor, unspecified: Secondary | ICD-10-CM | POA: Diagnosis present

## 2020-03-06 DIAGNOSIS — I10 Essential (primary) hypertension: Secondary | ICD-10-CM | POA: Diagnosis present

## 2020-03-06 DIAGNOSIS — N179 Acute kidney failure, unspecified: Secondary | ICD-10-CM | POA: Diagnosis present

## 2020-03-06 DIAGNOSIS — J1282 Pneumonia due to coronavirus disease 2019: Secondary | ICD-10-CM | POA: Diagnosis present

## 2020-03-06 HISTORY — DX: Other pulmonary embolism without acute cor pulmonale: I26.99

## 2020-03-06 LAB — CBC WITH DIFFERENTIAL/PLATELET
Abs Immature Granulocytes: 0 10*3/uL (ref 0.00–0.07)
Basophils Absolute: 0 10*3/uL (ref 0.0–0.1)
Basophils Relative: 0 %
Eosinophils Absolute: 0 10*3/uL (ref 0.0–0.5)
Eosinophils Relative: 1 %
HCT: 39.6 % (ref 39.0–52.0)
Hemoglobin: 13.1 g/dL (ref 13.0–17.0)
Immature Granulocytes: 0 %
Lymphocytes Relative: 10 %
Lymphs Abs: 0.3 10*3/uL — ABNORMAL LOW (ref 0.7–4.0)
MCH: 29.3 pg (ref 26.0–34.0)
MCHC: 33.1 g/dL (ref 30.0–36.0)
MCV: 88.6 fL (ref 80.0–100.0)
Monocytes Absolute: 0.2 10*3/uL (ref 0.1–1.0)
Monocytes Relative: 7 %
Neutro Abs: 2.2 10*3/uL (ref 1.7–7.7)
Neutrophils Relative %: 82 %
Platelets: 150 10*3/uL (ref 150–400)
RBC: 4.47 MIL/uL (ref 4.22–5.81)
RDW: 13.8 % (ref 11.5–15.5)
WBC: 2.8 10*3/uL — ABNORMAL LOW (ref 4.0–10.5)
nRBC: 0 % (ref 0.0–0.2)

## 2020-03-06 LAB — URINALYSIS, ROUTINE W REFLEX MICROSCOPIC
Bilirubin Urine: NEGATIVE
Glucose, UA: NEGATIVE mg/dL
Ketones, ur: NEGATIVE mg/dL
Leukocytes,Ua: NEGATIVE
Nitrite: NEGATIVE
Protein, ur: 100 mg/dL — AB
Specific Gravity, Urine: >1.03 — ABNORMAL HIGH (ref 1.005–1.030)
pH: 5.5 (ref 5.0–8.0)

## 2020-03-06 LAB — URINALYSIS, MICROSCOPIC (REFLEX): RBC / HPF: >50 RBC/hpf (ref 0–5)

## 2020-03-06 LAB — COMPREHENSIVE METABOLIC PANEL
ALT: 64 U/L — ABNORMAL HIGH (ref 0–44)
AST: 68 U/L — ABNORMAL HIGH (ref 15–41)
Albumin: 4.2 g/dL (ref 3.5–5.0)
Alkaline Phosphatase: 43 U/L (ref 38–126)
Anion gap: 10 (ref 5–15)
BUN: 19 mg/dL (ref 8–23)
CO2: 22 mmol/L (ref 22–32)
Calcium: 9.6 mg/dL (ref 8.9–10.3)
Chloride: 100 mmol/L (ref 98–111)
Creatinine, Ser: 1.47 mg/dL — ABNORMAL HIGH (ref 0.61–1.24)
GFR calc Af Amer: 53 mL/min — ABNORMAL LOW (ref >60)
GFR calc non Af Amer: 45 mL/min — ABNORMAL LOW (ref >60)
Glucose, Bld: 119 mg/dL — ABNORMAL HIGH (ref 70–99)
Potassium: 3.8 mmol/L (ref 3.5–5.1)
Sodium: 132 mmol/L — ABNORMAL LOW (ref 135–145)
Total Bilirubin: 0.6 mg/dL (ref 0.3–1.2)
Total Protein: 8.2 g/dL — ABNORMAL HIGH (ref 6.5–8.1)

## 2020-03-06 LAB — LACTIC ACID, PLASMA
Lactic Acid, Venous: 2.1 mmol/L (ref 0.5–1.9)
Lactic Acid, Venous: 1.9 mmol/L (ref 0.5–1.9)

## 2020-03-06 LAB — PROTIME-INR
INR: 1.8 — ABNORMAL HIGH (ref 0.8–1.2)
Prothrombin Time: 20.7 seconds — ABNORMAL HIGH (ref 11.4–15.2)

## 2020-03-06 LAB — APTT: aPTT: 34 seconds (ref 24–36)

## 2020-03-06 LAB — SARS CORONAVIRUS 2 BY RT PCR (HOSPITAL ORDER, PERFORMED IN ~~LOC~~ HOSPITAL LAB): SARS Coronavirus 2: POSITIVE — AB

## 2020-03-06 MED ORDER — ACETAMINOPHEN 325 MG PO TABS
650.0000 mg | ORAL_TABLET | Freq: Once | ORAL | Status: AC
  Administered 2020-03-06: 650 mg via ORAL
  Filled 2020-03-06: qty 2

## 2020-03-06 MED ORDER — SODIUM CHLORIDE 0.9 % IV BOLUS
1000.0000 mL | Freq: Once | INTRAVENOUS | Status: AC
  Administered 2020-03-06: 1000 mL via INTRAVENOUS

## 2020-03-06 MED ORDER — IPRATROPIUM-ALBUTEROL 20-100 MCG/ACT IN AERS
1.0000 | INHALATION_SPRAY | Freq: Four times a day (QID) | RESPIRATORY_TRACT | Status: DC | PRN
  Filled 2020-03-06: qty 4

## 2020-03-06 MED ORDER — SODIUM CHLORIDE 0.9 % IV SOLN: INTRAVENOUS | Status: AC

## 2020-03-06 MED ORDER — ALBUTEROL SULFATE HFA 108 (90 BASE) MCG/ACT IN AERS
2.0000 | INHALATION_SPRAY | RESPIRATORY_TRACT | Status: DC | PRN
  Filled 2020-03-06: qty 6.7

## 2020-03-06 NOTE — ED Notes (Signed)
Spoke with Levada Dy; sister in law; gave update and plan of care. Answered all questions and advised would call family when patient is assigned a bed. Verbalized understanding.

## 2020-03-06 NOTE — ED Notes (Signed)
Granddaughter number- [phone] Wife- [phone]

## 2020-03-06 NOTE — ED Triage Notes (Signed)
Had his chemo treatment this morning and daughter noted that patient is having tremors worst that is progressively getting worst.

## 2020-03-06 NOTE — ED Provider Notes (Signed)
[facility] EMERGENCY DEPARTMENT Provider Note   CSN: [id number] Arrival date & time: 03/06/20  1630     History Chief Complaint  Patient presents with  . Tremors    [facility] is a 79 y.o. male.  HPI 79 year old male with a history of COPD, cholesterol, hypertension, PE, bladder carcinoma currently on chemotherapy presents to the ER with complaints of tremors.  History provided largely by the patient's daughter.  Patient's daughter states that he began to have some tremors in his hands which began last night.  He received chemo this morning and was having difficulty getting out of the car, reporting severe weakness.  She then decided to bring him to the hospital.  The patient is unvaccinated for Covid, and his niece today came home from school with a fever of 101.  Daughter also reports that she has been having some cough.  He reports a dry cough for the last few days.  Denies any nausea, vomit, abdominal pain, chest pain, shortness of breath, urinary symptoms.  He has been compliant with his Eliquis.    Past Medical History:  Diagnosis Date  . bladder   . COPD (chronic obstructive pulmonary disease) ([place])   . High cholesterol   . Hypertension   . Pulmonary embolism [facility])     Patient Active Problem List   Diagnosis Date Noted  . COVID-19 03/06/2020    Past Surgical History:  Procedure Laterality Date  . BLADDER SURGERY         No family history on file.  Social History   Tobacco Use  . Smoking status: Former Research scientist (life sciences)  . Smokeless tobacco: Never Used  Vaping Use  . Vaping Use: Never used  Substance Use Topics  . Alcohol use: No  . Drug use: Never    Home Medications Prior to Admission medications   Medication Sig Start Date End Date Taking? Authorizing Provider  aspirin EC 81 MG tablet Take by mouth.    [provider]  ELIQUIS 5 MG TABS tablet  09/26/17   [provider]  HYDROcodone-acetaminophen (NORCO/VICODIN) 5-325 MG tablet  Take 1-2 tablets by mouth every 6 (six) hours as needed. Patient not taking: Reported on 10/10/2017 10/05/15   Ward, Delice Bison, DO  olmesartan (BENICAR) 20 MG tablet Take 20 mg by mouth daily.    [provider]  olmesartan-hydrochlorothiazide (BENICAR HCT) 40-25 MG tablet TAKE 1 TABLET BY MOUTH ONCE DAILY 06/20/17   [provider]  oxyCODONE-acetaminophen (PERCOCET) 7.5-325 MG per tablet Take 1 tablet by mouth every 4 (four) hours as needed for pain. Patient not taking: Reported on 10/10/2017 07/06/14   Leonard Schwartz, MD  rosuvastatin (CRESTOR) 20 MG tablet  09/26/17   [provider]  rosuvastatin (CRESTOR) 40 MG tablet Take 40 mg by mouth daily. 3 times/week    [provider]  Tiotropium Bromide-Olodaterol 2.5-2.5 MCG/ACT AERS Inhale into the lungs. 12/28/16   [provider]    Allergies    Patient has no known allergies.  Review of Systems   Review of Systems  Constitutional: Negative for chills and fever.  HENT: Negative for ear pain and sore throat.   Eyes: Negative for pain and visual disturbance.  Respiratory: Positive for cough. Negative for shortness of breath.   Cardiovascular: Negative for chest pain and palpitations.  Gastrointestinal: Negative for abdominal pain and vomiting.  Genitourinary: Negative for dysuria and hematuria.  Musculoskeletal: Negative for arthralgias and back pain.  Skin: Negative for color change  and rash.  Neurological: Positive for tremors and weakness. Negative for seizures and syncope.  All other systems reviewed and are negative.   Physical Exam Updated Vital Signs BP 131/72 (BP Location: Right Arm)   Pulse 99   Temp (!) 102.7 F (39.3 C) (Oral)   Resp (!) 25   Ht 5\' 9"  (1.753 m)   Wt 104.3 kg   SpO2 95%   BMI 33.97 kg/m   Physical Exam Vitals and nursing note reviewed.  Constitutional:      General: He is not in acute distress.    Appearance: Normal appearance. He is well-developed. He is not  ill-appearing, toxic-appearing or diaphoretic.  HENT:     Head: Normocephalic and atraumatic.     Mouth/Throat:     Mouth: Mucous membranes are moist.     Pharynx: Oropharynx is clear.  Eyes:     Conjunctiva/sclera: Conjunctivae normal.  Cardiovascular:     Rate and Rhythm: Normal rate and regular rhythm.     Pulses: Normal pulses.     Heart sounds: Normal heart sounds. No murmur heard.   Pulmonary:     Effort: Pulmonary effort is normal. No respiratory distress.     Breath sounds: Normal breath sounds.     Comments: Respirations equal and unlabored, patient able to speak in full sentences, lungs clear to auscultation bilaterally  Abdominal:     General: Abdomen is flat.     Palpations: Abdomen is soft.     Tenderness: There is no abdominal tenderness.  Musculoskeletal:        General: No tenderness. Normal range of motion.     Cervical back: Neck supple.     Right lower leg: No edema.     Left lower leg: No edema.  Skin:    General: Skin is warm and dry.     Capillary Refill: Capillary refill takes less than 2 seconds.     Findings: No erythema or rash.  Neurological:     General: No focal deficit present.     Mental Status: He is alert and oriented to person, place, and time.     Cranial Nerves: No cranial nerve deficit.     Sensory: No sensory deficit.     Motor: No weakness.     Coordination: Coordination normal.     Comments: Pt is weak; having difficulty transferring from wheelchair to bed   Psychiatric:        Mood and Affect: Mood normal.        Behavior: Behavior normal.     ED Results / Procedures / Treatments   Labs (all labs ordered are listed, but only abnormal results are displayed) Labs Reviewed  SARS CORONAVIRUS 2 BY RT PCR (HOSPITAL ORDER, [place] [facility]) - Abnormal; Notable for the following components:      Result Value   SARS Coronavirus 2 POSITIVE (*)    All other components within normal limits  LACTIC ACID, PLASMA -  Abnormal; Notable for the following components:   Lactic Acid, Venous 2.1 (*)    All other components within normal limits  COMPREHENSIVE METABOLIC PANEL - Abnormal; Notable for the following components:   Sodium 132 (*)    Glucose, Bld 119 (*)    Creatinine, Ser 1.47 (*)    Total Protein 8.2 (*)    AST 68 (*)    ALT 64 (*)    GFR calc non Af Amer 45 (*)    GFR calc Af Wyvonnia Lora  53 (*)    All other components within normal limits  CBC WITH DIFFERENTIAL/PLATELET - Abnormal; Notable for the following components:   WBC 2.8 (*)    Lymphs Abs 0.3 (*)    All other components within normal limits  URINALYSIS, ROUTINE W REFLEX MICROSCOPIC - Abnormal; Notable for the following components:   APPearance CLOUDY (*)    Specific Gravity, Urine >1.030 (*)    Hgb urine dipstick LARGE (*)    Protein, ur 100 (*)    All other components within normal limits  URINALYSIS, MICROSCOPIC (REFLEX) - Abnormal; Notable for the following components:   Bacteria, UA MANY (*)    All other components within normal limits  PROTIME-INR - Abnormal; Notable for the following components:   Prothrombin Time 20.7 (*)    INR 1.8 (*)    All other components within normal limits  URINE CULTURE  CULTURE, BLOOD (ROUTINE X 2)  CULTURE, BLOOD (ROUTINE X 2)  APTT  LACTIC ACID, PLASMA    EKG None  Radiology DG Chest Port 1 View  Result Date: 03/06/2020 CLINICAL DATA:  Questionable sepsis - evaluate for abnormality Tremors since chemo this morning. EXAM: PORTABLE CHEST 1 VIEW COMPARISON:  09/03/2018 FINDINGS: The cardiomediastinal contours are normal. Subsegmental opacities at both lung bases. Pulmonary vasculature is normal. No consolidation, pleural effusion, or pneumothorax. No acute osseous abnormalities are seen. No bands degenerative change in the shoulders. IMPRESSION: Subsegmental bibasilar atelectasis. Electronically Signed   By: Keith Rake M.D.   On: 03/06/2020 18:18    Procedures Procedures (including  critical care time)  Medications Ordered in ED Medications  albuterol (VENTOLIN HFA) 108 (90 Base) MCG/ACT inhaler 2 puff (has no administration in time range)  Ipratropium-Albuterol (COMBIVENT) respimat 1 puff (has no administration in time range)  0.9 %  sodium chloride infusion (has no administration in time range)  acetaminophen (TYLENOL) tablet 650 mg (650 mg Oral Given 03/06/20 1749)  sodium chloride 0.9 % bolus 1,000 mL (1,000 mLs Intravenous New Bag/Given 03/06/20 1936)    ED Course  I have reviewed the triage vital signs and the nursing notes.  Pertinent labs & imaging results that were available during my care of the patient were reviewed by me and considered in my medical decision making (see chart for details).    MDM Rules/Calculators/A&P                         79 year old male with tremors and weakness has not last night On presentation, he is alert, oriented, speaking full sentences without any respiratory distress.  Physical exam without any gross neurologic abnormalities, lung sounds clear.  On arrival, the patient was febrile with a fever of 103.1, slightly tachycardic with a pulse of 103.  Not tachypneic on my exam.  Sats between 95% and 97%.  Blood pressure of 161/85 on presentation.  Code sepsis was not initiated, however the order set was used.  Patient is Covid positive.  CMP with mild hyponatremia 132, no other significant electrolyte abnormalities.  Glucose of 119, and creatinine 1.467 [place] secondary to dehydration and infection.  Mild transaminitis noted consistent with COVID-19.  GFR 45.  Lactic elevated at 2.1, patient did receive a bolus of fluids.  Will recheck.  UA with large hemoglobin, however no clear evidence of UTI.  CBC with leukopenia of 2.8.  Chest x-ray without evidence of pneumonia.  Given the patient's history of PE and cancer history, this is still on differential, however  his oxygen saturations are normal at this time, he is not complaining of  shortness of breath, and his tachycardia is likely reactive to the fever.  EKG with sinus tach.  Consultedt Dr. Darrell Jewel with [facility] who will admit the patient for further management.  Patient will be held in the [facility] ER until available bed is open at [facility].   Final Clinical Impression(s) / ED Diagnoses Final diagnoses:  [id number]    Rx / DC Orders ED Discharge Orders    None       Garald Balding, PA-C 03/06/20 [place], [place], DO 03/06/20 1955

## 2020-03-07 ENCOUNTER — Encounter (HOSPITAL_COMMUNITY): Payer: Self-pay | Admitting: Family Medicine

## 2020-03-07 DIAGNOSIS — D72819 Decreased white blood cell count, unspecified: Secondary | ICD-10-CM | POA: Diagnosis present

## 2020-03-07 DIAGNOSIS — R0902 Hypoxemia: Secondary | ICD-10-CM | POA: Diagnosis present

## 2020-03-07 DIAGNOSIS — Z8551 Personal history of malignant neoplasm of bladder: Secondary | ICD-10-CM | POA: Diagnosis not present

## 2020-03-07 DIAGNOSIS — I10 Essential (primary) hypertension: Secondary | ICD-10-CM | POA: Diagnosis present

## 2020-03-07 DIAGNOSIS — A4189 Other specified sepsis: Principal | ICD-10-CM

## 2020-03-07 DIAGNOSIS — E7800 Pure hypercholesterolemia, unspecified: Secondary | ICD-10-CM | POA: Diagnosis present

## 2020-03-07 DIAGNOSIS — Z7982 Long term (current) use of aspirin: Secondary | ICD-10-CM | POA: Diagnosis not present

## 2020-03-07 DIAGNOSIS — J440 Chronic obstructive pulmonary disease with acute lower respiratory infection: Secondary | ICD-10-CM | POA: Diagnosis present

## 2020-03-07 DIAGNOSIS — E860 Dehydration: Secondary | ICD-10-CM | POA: Diagnosis present

## 2020-03-07 DIAGNOSIS — Z86711 Personal history of pulmonary embolism: Secondary | ICD-10-CM | POA: Diagnosis not present

## 2020-03-07 DIAGNOSIS — Z79899 Other long term (current) drug therapy: Secondary | ICD-10-CM | POA: Diagnosis not present

## 2020-03-07 DIAGNOSIS — Z7901 Long term (current) use of anticoagulants: Secondary | ICD-10-CM | POA: Diagnosis not present

## 2020-03-07 DIAGNOSIS — E785 Hyperlipidemia, unspecified: Secondary | ICD-10-CM | POA: Diagnosis present

## 2020-03-07 DIAGNOSIS — R6520 Severe sepsis without septic shock: Secondary | ICD-10-CM | POA: Diagnosis present

## 2020-03-07 DIAGNOSIS — R251 Tremor, unspecified: Secondary | ICD-10-CM | POA: Diagnosis present

## 2020-03-07 DIAGNOSIS — J1282 Pneumonia due to coronavirus disease 2019: Secondary | ICD-10-CM | POA: Diagnosis present

## 2020-03-07 DIAGNOSIS — R7401 Elevation of levels of liver transaminase levels: Secondary | ICD-10-CM | POA: Diagnosis present

## 2020-03-07 DIAGNOSIS — J449 Chronic obstructive pulmonary disease, unspecified: Secondary | ICD-10-CM | POA: Diagnosis present

## 2020-03-07 DIAGNOSIS — E871 Hypo-osmolality and hyponatremia: Secondary | ICD-10-CM | POA: Diagnosis present

## 2020-03-07 DIAGNOSIS — R748 Abnormal levels of other serum enzymes: Secondary | ICD-10-CM | POA: Diagnosis present

## 2020-03-07 DIAGNOSIS — U071 COVID-19: Secondary | ICD-10-CM

## 2020-03-07 DIAGNOSIS — N179 Acute kidney failure, unspecified: Secondary | ICD-10-CM | POA: Diagnosis present

## 2020-03-07 DIAGNOSIS — R8271 Bacteriuria: Secondary | ICD-10-CM

## 2020-03-07 DIAGNOSIS — Z87891 Personal history of nicotine dependence: Secondary | ICD-10-CM | POA: Diagnosis not present

## 2020-03-07 DIAGNOSIS — C679 Malignant neoplasm of bladder, unspecified: Secondary | ICD-10-CM | POA: Diagnosis present

## 2020-03-07 DIAGNOSIS — I2699 Other pulmonary embolism without acute cor pulmonale: Secondary | ICD-10-CM | POA: Diagnosis not present

## 2020-03-07 DIAGNOSIS — R0603 Acute respiratory distress: Secondary | ICD-10-CM | POA: Diagnosis present

## 2020-03-07 LAB — COMPREHENSIVE METABOLIC PANEL
ALT: 57 U/L — ABNORMAL HIGH (ref 0–44)
AST: 50 U/L — ABNORMAL HIGH (ref 15–41)
Albumin: 3.4 g/dL — ABNORMAL LOW (ref 3.5–5.0)
Alkaline Phosphatase: 39 U/L (ref 38–126)
Anion gap: 8 (ref 5–15)
BUN: 15 mg/dL (ref 8–23)
CO2: 24 mmol/L (ref 22–32)
Calcium: 8.9 mg/dL (ref 8.9–10.3)
Chloride: 104 mmol/L (ref 98–111)
Creatinine, Ser: 1.45 mg/dL — ABNORMAL HIGH (ref 0.61–1.24)
GFR calc Af Amer: 53 mL/min — ABNORMAL LOW (ref >60)
GFR calc non Af Amer: 46 mL/min — ABNORMAL LOW (ref >60)
Glucose, Bld: 96 mg/dL (ref 70–99)
Potassium: 4.1 mmol/L (ref 3.5–5.1)
Sodium: 136 mmol/L (ref 135–145)
Total Bilirubin: 0.7 mg/dL (ref 0.3–1.2)
Total Protein: 6.8 g/dL (ref 6.5–8.1)

## 2020-03-07 LAB — CBC WITH DIFFERENTIAL/PLATELET
Abs Immature Granulocytes: 0 10*3/uL (ref 0.00–0.07)
Basophils Absolute: 0.1 10*3/uL (ref 0.0–0.1)
Basophils Relative: 3 %
Eosinophils Absolute: 0 10*3/uL (ref 0.0–0.5)
Eosinophils Relative: 1 %
HCT: 37.4 % — ABNORMAL LOW (ref 39.0–52.0)
Hemoglobin: 12 g/dL — ABNORMAL LOW (ref 13.0–17.0)
Lymphocytes Relative: 21 %
Lymphs Abs: 0.5 10*3/uL — ABNORMAL LOW (ref 0.7–4.0)
MCH: 28.9 pg (ref 26.0–34.0)
MCHC: 32.1 g/dL (ref 30.0–36.0)
MCV: 90.1 fL (ref 80.0–100.0)
Monocytes Absolute: 0.2 10*3/uL (ref 0.1–1.0)
Monocytes Relative: 8 %
Neutro Abs: 1.5 10*3/uL — ABNORMAL LOW (ref 1.7–7.7)
Neutrophils Relative %: 67 %
Platelets: 125 10*3/uL — ABNORMAL LOW (ref 150–400)
RBC: 4.15 MIL/uL — ABNORMAL LOW (ref 4.22–5.81)
RDW: 14.2 % (ref 11.5–15.5)
WBC: 2.3 10*3/uL — ABNORMAL LOW (ref 4.0–10.5)
nRBC: 0 % (ref 0.0–0.2)
nRBC: 0 /100 WBC

## 2020-03-07 LAB — LACTATE DEHYDROGENASE: LDH: 124 U/L (ref 98–192)

## 2020-03-07 LAB — BRAIN NATRIURETIC PEPTIDE: B Natriuretic Peptide: 17.6 pg/mL (ref 0.0–100.0)

## 2020-03-07 LAB — HEPATITIS B SURFACE ANTIGEN: Hepatitis B Surface Ag: NONREACTIVE

## 2020-03-07 LAB — PROCALCITONIN: Procalcitonin: <0.1 ng/mL

## 2020-03-07 LAB — LACTIC ACID, PLASMA: Lactic Acid, Venous: 0.8 mmol/L (ref 0.5–1.9)

## 2020-03-07 LAB — FIBRINOGEN: Fibrinogen: 483 mg/dL — ABNORMAL HIGH (ref 210–475)

## 2020-03-07 LAB — FERRITIN: Ferritin: 245 ng/mL (ref 24–336)

## 2020-03-07 LAB — D-DIMER, QUANTITATIVE: D-Dimer, Quant: 0.51 ug/mL-FEU — ABNORMAL HIGH (ref 0.00–0.50)

## 2020-03-07 LAB — C-REACTIVE PROTEIN: CRP: 3.6 mg/dL — ABNORMAL HIGH (ref <1.0)

## 2020-03-07 LAB — TROPONIN I (HIGH SENSITIVITY)
Troponin I (High Sensitivity): 16 ng/L (ref <18)
Troponin I (High Sensitivity): 18 ng/L — ABNORMAL HIGH (ref <18)

## 2020-03-07 MED ORDER — WARFARIN SODIUM 7.5 MG PO TABS
7.5000 mg | ORAL_TABLET | Freq: Once | ORAL | Status: AC
  Administered 2020-03-07: 7.5 mg via ORAL
  Filled 2020-03-07: qty 1

## 2020-03-07 MED ORDER — ACETAMINOPHEN 325 MG PO TABS: 650.0000 mg | ORAL_TABLET | Freq: Four times a day (QID) | ORAL | Status: DC | PRN

## 2020-03-07 MED ORDER — ONDANSETRON HCL 4 MG/2ML IJ SOLN: 4.0000 mg | Freq: Four times a day (QID) | INTRAMUSCULAR | Status: DC | PRN

## 2020-03-07 MED ORDER — HYDROCOD POLST-CPM POLST ER 10-8 MG/5ML PO SUER: 5.0000 mL | Freq: Two times a day (BID) | ORAL | Status: DC | PRN

## 2020-03-07 MED ORDER — DEXAMETHASONE SODIUM PHOSPHATE 10 MG/ML IJ SOLN: 6.0000 mg | Freq: Every day | INTRAMUSCULAR | Status: DC

## 2020-03-07 MED ORDER — ROSUVASTATIN CALCIUM 20 MG PO TABS
20.0000 mg | ORAL_TABLET | Freq: Every day | ORAL | Status: DC
  Administered 2020-03-07 – 2020-03-09 (×3): 20 mg via ORAL
  Filled 2020-03-07 (×2): qty 1
  Filled 2020-03-07: qty 4

## 2020-03-07 MED ORDER — SODIUM CHLORIDE 0.9 % IV SOLN
100.0000 mg | Freq: Every day | INTRAVENOUS | Status: DC
  Administered 2020-03-08 – 2020-03-09 (×2): 100 mg via INTRAVENOUS
  Filled 2020-03-07 (×2): qty 100
  Filled 2020-03-07: qty 20

## 2020-03-07 MED ORDER — WARFARIN SODIUM 5 MG PO TABS: 5.0000 mg | ORAL_TABLET | ORAL | Status: DC

## 2020-03-07 MED ORDER — ENOXAPARIN SODIUM 40 MG/0.4ML ~~LOC~~ SOLN: 40.0000 mg | SUBCUTANEOUS | Status: DC

## 2020-03-07 MED ORDER — ACETAMINOPHEN 325 MG PO TABS
650.0000 mg | ORAL_TABLET | Freq: Four times a day (QID) | ORAL | Status: DC | PRN
  Administered 2020-03-07: 650 mg via ORAL
  Filled 2020-03-07: qty 2

## 2020-03-07 MED ORDER — WARFARIN - PHARMACIST DOSING INPATIENT: Freq: Every day | Status: DC

## 2020-03-07 MED ORDER — ASCORBIC ACID 500 MG PO TABS
500.0000 mg | ORAL_TABLET | Freq: Every day | ORAL | Status: DC
  Administered 2020-03-07 – 2020-03-09 (×3): 500 mg via ORAL
  Filled 2020-03-07 (×3): qty 1

## 2020-03-07 MED ORDER — GUAIFENESIN-DM 100-10 MG/5ML PO SYRP: 10.0000 mL | ORAL_SOLUTION | ORAL | Status: DC | PRN

## 2020-03-07 MED ORDER — SODIUM CHLORIDE 0.9 % IV SOLN
200.0000 mg | Freq: Once | INTRAVENOUS | Status: AC
  Administered 2020-03-07: 200 mg via INTRAVENOUS
  Filled 2020-03-07: qty 40

## 2020-03-07 MED ORDER — ONDANSETRON HCL 4 MG PO TABS: 4.0000 mg | ORAL_TABLET | Freq: Four times a day (QID) | ORAL | Status: DC | PRN

## 2020-03-07 MED ORDER — ASPIRIN EC 81 MG PO TBEC
81.0000 mg | DELAYED_RELEASE_TABLET | Freq: Every day | ORAL | Status: DC
  Administered 2020-03-07 – 2020-03-09 (×3): 81 mg via ORAL
  Filled 2020-03-07 (×3): qty 1

## 2020-03-07 MED ORDER — ZINC SULFATE 220 (50 ZN) MG PO CAPS
220.0000 mg | ORAL_CAPSULE | Freq: Every day | ORAL | Status: DC
  Administered 2020-03-07 – 2020-03-09 (×3): 220 mg via ORAL
  Filled 2020-03-07 (×3): qty 1

## 2020-03-07 NOTE — Progress Notes (Signed)
ANTICOAGULATION CONSULT NOTE - Initial Consult  Pharmacy Consult for warfarin Indication: hx VTE  No Known Allergies  Patient Measurements: Height: 5\' 9"  (175.3 cm) Weight: 104.3 kg (230 lb) IBW/kg (Calculated) : 70.7  Vital Signs: Temp: 99.5 F (37.5 C) (09/04 1355) Temp Source: Oral (09/04 1355) BP: 131/76 (09/04 1355) Pulse Rate: 76 (09/04 1248)  Labs: Recent Labs    03/06/20 1800 03/07/20 1438  HGB 13.1 12.0*  HCT 39.6 37.4*  PLT 150 125*  APTT 34  --   LABPROT 20.7*  --   INR 1.8*  --   CREATININE 1.47*  --     Estimated Creatinine Clearance: 50.1 mL/min (A) (by C-G formula based on SCr of 1.47 mg/dL (H)).   Medical History: Past Medical History:  Diagnosis Date  . bladder   . COPD (chronic obstructive pulmonary disease) ([place])   . High cholesterol   . Hypertension   . Pulmonary embolism ([facility])    Assessment: 52 yom presented to the ED with generalized weakness and fever. He is on chronic warfarin for history of PE and DVT. INR is slightly below goal. Hgb and platelets are slightly low as well. No bleeding noted.   PTA warfarin regimen: 7.5mg  MWF and 5mg  all other days  Goal of Therapy:  INR 2-3 Monitor platelets by anticoagulation protocol: Yes   Plan:  Warfarin 7.5mg  PO x 1 tonight Daily INR  Rosmarie Esquibel, Rande Lawman 03/07/2020,3:27 PM

## 2020-03-07 NOTE — H&P (Signed)
History and Physical    Victor [facility]:[id number] DOB: Dec 25, 1940 DOA: 03/06/2020  PCP: Heywood Bene, PA-C  Patient coming from: [place] have personally briefly reviewed patient's old medical records in [place]  Chief Complaint: Generalized weakness and fever  HPI: [facility] is a 79 y.o. male with medical history significant of bladder cancer currently on chemotherapy, COPD-not on oxygen, hypertension, hyperlipidemia, PE-on Coumadin presents to emergency department with generalized weakness, fever and chills.  Patient tells me that he has been feeling weak, has fever associated with chills and rigors.  He gets chemotherapy for his bladder cancer once in a week.  Reports no energy, decreased appetite however denies cough, congestion, shortness of breath, wheezing, chest pain, nausea, vomiting, diarrhea, abdominal pain, dysuria, hematuria, change in urinary frequency, abdominal pain, headache, blurry vision, lightheadedness or dizziness.  No history of smoking, alcohol, illicit drug use.  He is not vaccinated against COVID-19.  He is compliant with Coumadin.  No history of leg swelling, recent travel, immobilization.  He tells me that he is feeling much better today.  ED Course: Upon arrival to ED: Patient had fever of 103.1, heart rate in 103, respiratory rate in 20s, CBC shows leukopia of 2.8, lactic acid of 2.1 trended down to 1.9, CMP shows AKI, AST: 68, ALT: 64, chest x-ray showed bibasilar atelectasis.  COVID-19 positive.  UA positive for bacteria.  Patient received breathing treatment in ED and transfer to [facility] for further evaluation and management of COVID-19 infection.  Review of Systems: As per HPI otherwise negative.    Past Medical History:  Diagnosis Date  . bladder   . COPD (chronic obstructive pulmonary disease) ([place])   . High cholesterol   . Hypertension   . Pulmonary embolism [facility])     Past Surgical History:  Procedure  Laterality Date  . BLADDER SURGERY       reports that he has quit smoking. He has never used smokeless tobacco. He reports that he does not drink alcohol and does not use drugs.  No Known Allergies  History reviewed. No pertinent family history.  Prior to Admission medications   Medication Sig Start Date End Date Taking? Authorizing Provider  aspirin EC 81 MG tablet Take by mouth.    [provider]  ELIQUIS 5 MG TABS tablet  09/26/17   [provider]  HYDROcodone-acetaminophen (NORCO/VICODIN) 5-325 MG tablet Take 1-2 tablets by mouth every 6 (six) hours as needed. Patient not taking: Reported on 10/10/2017 10/05/15   Ward, Delice Bison, DO  olmesartan (BENICAR) 20 MG tablet Take 20 mg by mouth daily.    [provider]  olmesartan-hydrochlorothiazide (BENICAR HCT) 40-25 MG tablet TAKE 1 TABLET BY MOUTH ONCE DAILY 06/20/17   [provider]  oxyCODONE-acetaminophen (PERCOCET) 7.5-325 MG per tablet Take 1 tablet by mouth every 4 (four) hours as needed for pain. Patient not taking: Reported on 10/10/2017 07/06/14   Leonard Schwartz, MD  rosuvastatin (CRESTOR) 20 MG tablet  09/26/17   [provider]  rosuvastatin (CRESTOR) 40 MG tablet Take 40 mg by mouth daily. 3 times/week    [provider]  Tiotropium Bromide-Olodaterol 2.5-2.5 MCG/ACT AERS Inhale into the lungs. 12/28/16   [provider]    Physical Exam: Vitals:   03/07/20 0700 03/07/20 1208 03/07/20 1248 03/07/20 1355  BP: 121/72 128/80 140/83 131/76  Pulse: 72  76   Resp: (!) _0 Temp:   99.9  F (37.7 C) 99.5 F (37.5 C)  TempSrc:   Oral Oral  SpO2: 97%  97% 97%  Weight:      Height:        Constitutional: NAD, calm, comfortable, on room air, communicating well Eyes: PERRL, lids and conjunctivae normal ENMT: Mucous membranes are moist. Posterior pharynx clear of any exudate or lesions.Normal dentition.  Neck: normal, supple, no masses, no  thyromegaly Respiratory: clear to auscultation bilaterally, no wheezing, no crackles. Normal respiratory effort. No accessory muscle use.  Cardiovascular: Regular rate and rhythm, no murmurs / rubs / gallops. No extremity edema. 2+ pedal pulses. No carotid bruits.  Abdomen: no tenderness, no masses palpated. No hepatosplenomegaly. Bowel sounds positive.  Musculoskeletal: no clubbing / cyanosis. No joint deformity upper and lower extremities. Good ROM, no contractures. Normal muscle tone.  Skin: no rashes, lesions, ulcers. No induration Neurologic: CN 2-12 grossly intact. Sensation intact, DTR normal. Strength 5/5 in all 4.  Psychiatric: Normal judgment and insight. Alert and oriented x 3. Normal mood.    Labs on Admission: I have personally reviewed following labs and imaging studies  CBC: Recent Labs  Lab 03/06/20 1800  WBC 2.8*  NEUTROABS 2.2  HGB 13.1  HCT 39.6  MCV 88.6  PLT 644   Basic Metabolic Panel: Recent Labs  Lab 03/06/20 1800  NA 132*  K 3.8  CL 100  CO2 22  GLUCOSE 119*  BUN 19  CREATININE 1.47*  CALCIUM 9.6   GFR: Estimated Creatinine Clearance: 50.1 mL/min (A) (by C-G formula based on SCr of 1.47 mg/dL (H)). Liver Function Tests: Recent Labs  Lab 03/06/20 1800  AST 68*  ALT 64*  ALKPHOS 43  BILITOT 0.6  PROT 8.2*  ALBUMIN 4.2   No results for input(s): LIPASE, AMYLASE in the last 168 hours. No results for input(s): AMMONIA in the last 168 hours. Coagulation Profile: Recent Labs  Lab 03/06/20 1800  INR 1.8*   Cardiac Enzymes: No results for input(s): CKTOTAL, CKMB, CKMBINDEX, TROPONINI in the last 168 hours. BNP (last 3 results) No results for input(s): PROBNP in the last 8760 hours. HbA1C: No results for input(s): HGBA1C in the last 72 hours. CBG: No results for input(s): GLUCAP in the last 168 hours. Lipid Profile: No results for input(s): CHOL, HDL, LDLCALC, TRIG, CHOLHDL, LDLDIRECT in the last 72 hours. Thyroid Function Tests: No  results for input(s): TSH, T4TOTAL, FREET4, T3FREE, THYROIDAB in the last 72 hours. Anemia Panel: No results for input(s): VITAMINB12, FOLATE, FERRITIN, TIBC, IRON, RETICCTPCT in the last 72 hours. Urine analysis:    Component Value Date/Time   COLORURINE YELLOW 03/06/2020 1743   APPEARANCEUR CLOUDY (A) 03/06/2020 1743   LABSPEC >1.030 (H) 03/06/2020 1743   PHURINE 5.5 03/06/2020 1743   GLUCOSEU NEGATIVE 03/06/2020 1743   HGBUR LARGE (A) 03/06/2020 1743   BILIRUBINUR NEGATIVE 03/06/2020 1743   KETONESUR NEGATIVE 03/06/2020 1743   PROTEINUR 100 (A) 03/06/2020 1743   UROBILINOGEN 1.0 01/07/2008 1316   NITRITE NEGATIVE 03/06/2020 1743   LEUKOCYTESUR NEGATIVE 03/06/2020 1743    Radiological Exams on Admission: DG Chest Port 1 View  Result Date: 03/06/2020 CLINICAL DATA:  Questionable sepsis - evaluate for abnormality Tremors since chemo this morning. EXAM: PORTABLE CHEST 1 VIEW COMPARISON:  09/03/2018 FINDINGS: The cardiomediastinal contours are normal. Subsegmental opacities at both lung bases. Pulmonary vasculature is normal. No consolidation, pleural effusion, or pneumothorax. No acute osseous abnormalities are seen. No bands degenerative change in the shoulders. IMPRESSION: Subsegmental bibasilar atelectasis. Electronically Signed  By: Keith Rake M.D.   On: 03/06/2020 18:18    EKG: Independently reviewed.  Sinus tachycardia, left axis deviation.  No ST elevation or depression noted.  Assessment/Plan Principal Problem:   Sepsis due to COVID-19 [facility]) Active Problems:   Hypertension   High cholesterol   COPD (chronic obstructive pulmonary disease) ([facility])   AKI (acute kidney injury) (HCC)   Leukopenia   Elevated liver enzymes   Pulmonary embolism (HCC)   Bladder cancer (HCC)   Asymptomatic bacteriuria   Severe sepsis due to COVID-19 infection: -Patient met sepsis criteria with fever of 103.1, WBC of 2.8, tachypnea, tachycardic, lactic acidosis of 2.1 and AKI.  COVID-19  positive.  Reviewed chest x-ray. -Start patient on remdesivir.  He is maintaining oxygen saturation on room air. -Ordered inflammatory markers.  Repeat CBC, BMP.  Check procalcitonin level. -Repeat inflammatory markers tomorrow AM.  Breathing treatment as needed.  Start on p.o. vitamins. -Monitor vitals closely.  Hypertension: Blood pressure is stable -Hold olmesartan-HCTZ due to AKI.  Monitor blood pressure closely.  Hydralazine as needed.  Hyperlipidemia: Continue statin  COPD: Stable.  Maintaining oxygen saturation on room air.  Continue home inhaler  AKI: -We will repeat BMP today.  Hold nephrotoxic medication.  PE: Coumadin per pharmacy.  Asymptomatic bacteriuria: Patient denies any urinary symptoms.  Hold antibiotics for now.  Leukopenia: WBC of 2.8.  Likely secondary to COVID-19 infection.    Elevated liver enzymes: AST: 68, ALT: 64.  -Likely secondary to COVID-19 infection.  Repeat CMP today  History of bladder cancer:  on chemotherapy once in a week.  DVT prophylaxis: Coumadin/SCD. Code Status: Full code Family Communication: None present at bedside.  Plan of care discussed with patient in length and he verbalized understanding and agreed with it. Disposition Plan: Home in 1 to 2 days Consults called: None Admission status: Inpatient   Mckinley Jewel MD [facility]  If 7PM-7AM, please contact night-coverage [email or web address] Password [email or web address]  03/07/2020, 2:21 PM

## 2020-03-07 NOTE — ED Notes (Signed)
Patient assisted to bedside commode for BM; patient tolerated well. Maintained sats 95-98% on room air.

## 2020-03-07 NOTE — ED Notes (Signed)
Gave pt HC frozen entree and water.

## 2020-03-07 NOTE — ED Notes (Addendum)
Pt requested to use bedside commode, assisted pt from bed

## 2020-03-07 NOTE — ED Provider Notes (Signed)
Emergency department observation rounding note  79 year old male arrived with complaint of tremors, found to be febrile, white blood cell count, currently being treated for bladder cancer.  Covid positive admitted to hospitalist service currently awaiting transfer. Physical Exam  BP 121/72   Pulse 72   Temp 99.8 F (37.7 C) (Oral)   Resp (!) 25   Ht 5\' 9"  (1.753 m)   Wt 104.3 kg   SpO2 97%   BMI 33.97 kg/m   Physical Exam Constitutional:      General: He is not in acute distress.    Appearance: Normal appearance. He is well-developed. He is not ill-appearing or diaphoretic.  HENT:     Head: Normocephalic and atraumatic.  Eyes:     General: Vision grossly intact. Gaze aligned appropriately.     Pupils: Pupils are equal, round, and reactive to light.  Neck:     Trachea: Trachea and phonation normal.  Pulmonary:     Effort: Pulmonary effort is normal. No respiratory distress.  Abdominal:     General: There is no distension.     Palpations: Abdomen is soft.     Tenderness: There is no abdominal tenderness. There is no guarding or rebound.  Musculoskeletal:        General: Normal range of motion.     Cervical back: Normal range of motion.  Skin:    General: Skin is warm and dry.  Neurological:     Mental Status: He is alert.     GCS: GCS eye subscore is 4. GCS verbal subscore is 5. GCS motor subscore is 6.     Comments: Speech is clear and goal oriented, follows commands Major Cranial nerves without deficit, no facial droop Moves extremities without ataxia, coordination intact  Psychiatric:        Behavior: Behavior normal.    MDM  Reviewed labs, Covid positive.  Urine shows blood, nitrite and leukocyte negative, some RBCs and WBCs, patient currently being treated for bladder cancer, not definitive for UTI, sent for culture.  CMP without emergent electrolyte derangement, slight elevation of creatinine and LFTs, no recent to compare.  CBC shows leukopenia, no anemia.  Lactic  1.9 improved from 2.1.  Patient has received Tylenol, normal saline and has albuterol/Combivent ordered as needed.  Chest x-ray shows subsegmental bibasilar atelectasis  10:35 AM: Patient assessment, he is sitting up comfortably in bed watching TV no acute distress vital signs stable.  He has no complaints or concerns reports he is feeling well.  He has recently finished eating.  Patient continues to await transfer.  Note: Portions of this report may have been transcribed using voice recognition software. Every effort was made to ensure accuracy; however, inadvertent computerized transcription errors may still be present.    Deliah Boston, PA-C 03/07/20 1049    Gareth Morgan, MD 03/09/20 1035

## 2020-03-07 NOTE — ED Notes (Signed)
Went into pt, brought new bedside commode, threw out soiled bed pan, put monitors back on pt, helped pt into bed and adjusted bed. Pt in NAD

## 2020-03-08 LAB — CBC WITH DIFFERENTIAL/PLATELET
Abs Immature Granulocytes: 0 10*3/uL (ref 0.00–0.07)
Basophils Absolute: 0 10*3/uL (ref 0.0–0.1)
Basophils Relative: 1 %
Eosinophils Absolute: 0 10*3/uL (ref 0.0–0.5)
Eosinophils Relative: 0 %
HCT: 36.7 % — ABNORMAL LOW (ref 39.0–52.0)
Hemoglobin: 12 g/dL — ABNORMAL LOW (ref 13.0–17.0)
Lymphocytes Relative: 13 %
Lymphs Abs: 0.5 10*3/uL — ABNORMAL LOW (ref 0.7–4.0)
MCH: 29.1 pg (ref 26.0–34.0)
MCHC: 32.7 g/dL (ref 30.0–36.0)
MCV: 89.1 fL (ref 80.0–100.0)
Monocytes Absolute: 0.2 10*3/uL (ref 0.1–1.0)
Monocytes Relative: 5 %
Neutro Abs: 3 10*3/uL (ref 1.7–7.7)
Neutrophils Relative %: 81 %
Platelets: 121 10*3/uL — ABNORMAL LOW (ref 150–400)
RBC: 4.12 MIL/uL — ABNORMAL LOW (ref 4.22–5.81)
RDW: 14.1 % (ref 11.5–15.5)
WBC: 3.7 10*3/uL — ABNORMAL LOW (ref 4.0–10.5)
nRBC: 0 % (ref 0.0–0.2)
nRBC: 0 /100 WBC

## 2020-03-08 LAB — COMPREHENSIVE METABOLIC PANEL
ALT: 51 U/L — ABNORMAL HIGH (ref 0–44)
AST: 44 U/L — ABNORMAL HIGH (ref 15–41)
Albumin: 3.4 g/dL — ABNORMAL LOW (ref 3.5–5.0)
Alkaline Phosphatase: 36 U/L — ABNORMAL LOW (ref 38–126)
Anion gap: 12 (ref 5–15)
BUN: 17 mg/dL (ref 8–23)
CO2: 19 mmol/L — ABNORMAL LOW (ref 22–32)
Calcium: 8.9 mg/dL (ref 8.9–10.3)
Chloride: 102 mmol/L (ref 98–111)
Creatinine, Ser: 1.3 mg/dL — ABNORMAL HIGH (ref 0.61–1.24)
GFR calc Af Amer: >60 mL/min (ref >60)
GFR calc non Af Amer: 53 mL/min — ABNORMAL LOW (ref >60)
Glucose, Bld: 105 mg/dL — ABNORMAL HIGH (ref 70–99)
Potassium: 4 mmol/L (ref 3.5–5.1)
Sodium: 133 mmol/L — ABNORMAL LOW (ref 135–145)
Total Bilirubin: 0.7 mg/dL (ref 0.3–1.2)
Total Protein: 6.7 g/dL (ref 6.5–8.1)

## 2020-03-08 LAB — URINE CULTURE: Culture: NO GROWTH

## 2020-03-08 LAB — D-DIMER, QUANTITATIVE: D-Dimer, Quant: 0.53 ug/mL-FEU — ABNORMAL HIGH (ref 0.00–0.50)

## 2020-03-08 LAB — LACTIC ACID, PLASMA: Lactic Acid, Venous: 1 mmol/L (ref 0.5–1.9)

## 2020-03-08 LAB — C-REACTIVE PROTEIN: CRP: 3.4 mg/dL — ABNORMAL HIGH (ref <1.0)

## 2020-03-08 LAB — MAGNESIUM: Magnesium: 1.5 mg/dL — ABNORMAL LOW (ref 1.7–2.4)

## 2020-03-08 LAB — PHOSPHORUS: Phosphorus: 2.7 mg/dL (ref 2.5–4.6)

## 2020-03-08 LAB — PROTIME-INR
INR: 1.7 — ABNORMAL HIGH (ref 0.8–1.2)
Prothrombin Time: 19.1 seconds — ABNORMAL HIGH (ref 11.4–15.2)

## 2020-03-08 LAB — FERRITIN: Ferritin: 236 ng/mL (ref 24–336)

## 2020-03-08 MED ORDER — POLYETHYLENE GLYCOL 3350 17 G PO PACK: 17.0000 g | PACK | Freq: Every day | ORAL | Status: DC | PRN

## 2020-03-08 MED ORDER — PREDNISONE 20 MG PO TABS
40.0000 mg | ORAL_TABLET | Freq: Every day | ORAL | Status: DC
  Administered 2020-03-08 – 2020-03-09 (×2): 40 mg via ORAL
  Filled 2020-03-08 (×2): qty 2

## 2020-03-08 MED ORDER — MAGNESIUM SULFATE 2 GM/50ML IV SOLN
2.0000 g | Freq: Once | INTRAVENOUS | Status: AC
  Administered 2020-03-08: 2 g via INTRAVENOUS
  Filled 2020-03-08: qty 50

## 2020-03-08 MED ORDER — WARFARIN SODIUM 7.5 MG PO TABS
7.5000 mg | ORAL_TABLET | Freq: Once | ORAL | Status: AC
  Administered 2020-03-08: 7.5 mg via ORAL
  Filled 2020-03-08: qty 1

## 2020-03-08 MED ORDER — SENNOSIDES-DOCUSATE SODIUM 8.6-50 MG PO TABS: 2.0000 | ORAL_TABLET | Freq: Every evening | ORAL | Status: DC | PRN

## 2020-03-08 MED ORDER — HYDROCOD POLST-CPM POLST ER 10-8 MG/5ML PO SUER: 5.0000 mL | Freq: Two times a day (BID) | ORAL | Status: DC | PRN

## 2020-03-08 MED ORDER — IPRATROPIUM-ALBUTEROL 20-100 MCG/ACT IN AERS
1.0000 | INHALATION_SPRAY | Freq: Four times a day (QID) | RESPIRATORY_TRACT | Status: DC
  Administered 2020-03-08 – 2020-03-09 (×6): 1 via RESPIRATORY_TRACT
  Filled 2020-03-08 (×3): qty 4

## 2020-03-08 MED ORDER — MAGNESIUM SULFATE 4 GM/100ML IV SOLN
4.0000 g | Freq: Once | INTRAVENOUS | Status: DC
  Filled 2020-03-08: qty 100

## 2020-03-08 MED ORDER — GUAIFENESIN-DM 100-10 MG/5ML PO SYRP: 10.0000 mL | ORAL_SOLUTION | ORAL | Status: DC | PRN

## 2020-03-08 NOTE — ED Notes (Signed)
Provided pt with lunch tray

## 2020-03-08 NOTE — Progress Notes (Signed)
ANTICOAGULATION CONSULT NOTE  Pharmacy Consult for warfarin Indication: hx VTE  No Known Allergies  Patient Measurements: Height: 5\' 9"  (175.3 cm) Weight: 104.3 kg (230 lb) IBW/kg (Calculated) : 70.7  Vital Signs: BP: 143/102 (09/05 0600) Pulse Rate: 83 (09/05 0600)  Labs: Recent Labs    03/06/20 1800 03/06/20 1800 03/07/20 1438 03/07/20 1843 03/08/20 0500  HGB 13.1   < > 12.0*  --  12.0*  HCT 39.6  --  37.4*  --  36.7*  PLT 150  --  125*  --  121*  APTT 34  --   --   --   --   LABPROT 20.7*  --   --   --  19.1*  INR 1.8*  --   --   --  1.7*  CREATININE 1.47*  --  1.45*  --  1.30*  TROPONINIHS  --   --  16 18*  --    < > = values in this interval not displayed.    Estimated Creatinine Clearance: 56.6 mL/min (A) (by C-G formula based on SCr of 1.3 mg/dL (H)).  Assessment: 33 yom presented to the ED with generalized weakness and fever. He is on chronic warfarin for history of PE and DVT. INR remains below goal. CBC is stable and no bleeding noted.   PTA warfarin regimen: 7.5mg  MWF and 5mg  all other days  Goal of Therapy:  INR 2-3 Monitor platelets by anticoagulation protocol: Yes   Plan:  Warfarin 7.5mg  PO x 1 tonight Daily INR  Nayquan Evinger, Rande Lawman 03/08/2020,10:56 AM

## 2020-03-08 NOTE — ED Notes (Signed)
+  tele Breakfast Ordered

## 2020-03-08 NOTE — Progress Notes (Signed)
PROGRESS NOTE    Victor Cooper  NWG:[id number] DOB: [birth year] DOA: 03/06/2020 PCP: Heywood Bene, PA-C   Brief Narrative:  79 year old with history of bladder cancer currently on chemo, COPD, HTN, PE on Coumadin, hyperlipidemia came to the hospital with weakness fevers chills diagnosed with sepsis secondary to COVID-19 pneumonia. Upon admission also had mild transaminitis and renal insufficiency.   Assessment & Plan:   Principal Problem:   Sepsis due to COVID-19 [facility]) Active Problems:   Hypertension   High cholesterol   COPD (chronic obstructive pulmonary disease) (HCC)   AKI (acute kidney injury) (HCC)   Leukopenia   Elevated liver enzymes   Pulmonary embolism (HCC)   Bladder cancer (HCC)   Asymptomatic bacteriuria  Sepsis secondary to COVID-19 pneumonia Acute hypoxic respiratory distress secondary to COVID-19 -Oxygen levels-currently on room air but patient is still tachypneic -Remdesivir-day 2/5 -prednisone p.o.-day 1/5 -Vaccination status-not vaccinated -Antibiotics-none -procalcitonin-negative -Chest x-ray-bibasilar infiltrates -Supportive care-antitussive, inhalers, I-S/flutter -CODE STATUS confirmed -Vitamin C & Zinc. Prone >16hrs/day.  -Routine: Labs have been reviewed including ferritin, LDH, CRP, d-dimer, fibrinogen.  Will need to trend this lab daily.  Mild transaminitis Renal insufficiency, Baseline Cr 1.1 -Admission Cr 1.4. This morning 1.3 around her baseline. -Transaminitis improving. Closely monitor  Essential hypertension -Home Benicar on hold  Hyperlipidemia -Daily Crestor  History of COPD -As needed bronchodilators  History of pulmonary embolism -On Coumadin, pharmacy to manage  Bladder cancer, Papillary transitional cell carcinoma high-grade -On weekly chemo-gemcitabine. Follows at [facility] with Dr. Amalia Hailey  DVT prophylaxis: Coumadin Code Status: Full code Family Communication:    Status is: Inpatient  Remains inpatient  appropriate because:Inpatient level of care appropriate due to severity of illness   Dispo: The patient is from: Home              Anticipated d/c is to: Home              Anticipated d/c date is: 2 days              Patient currently is not medically stable to d/c. Tachypneic with exertional dyspnea. Maintain hospital stay.  Body mass index is 33.97 kg/m.    Subjective: Seen and examined at bedside. No complaints besides some exertional shortness of breath.   Review of Systems Otherwise negative except as per HPI, including: General: Denies fever, chills, night sweats or unintended weight loss. Resp: Denies cough, wheezing, shortness of breath. Cardiac: Denies chest pain, palpitations, orthopnea, paroxysmal nocturnal dyspnea. GI: Denies abdominal pain, nausea, vomiting, diarrhea or constipation GU: Denies dysuria, frequency, hesitancy or incontinence MS: Denies muscle aches, joint pain or swelling Neuro: Denies headache, neurologic deficits (focal weakness, numbness, tingling), abnormal gait Psych: Denies anxiety, depression, SI/HI/AVH Skin: Denies new rashes or lesions ID: Denies sick contacts, exotic exposures, travel  Examination: Constitutional: Not in acute distress Respiratory: b/l bibasilar rhonchi.  Cardiovascular: Normal sinus rhythm, no rubs Abdomen: Nontender nondistended good bowel sounds Musculoskeletal: No edema noted Skin: No rashes seen Neurologic: CN 2-12 grossly intact.  And nonfocal Psychiatric: Normal judgment and insight. Alert and oriented x 3. Normal mood.   Objective: Vitals:   03/08/20 0300 03/08/20 0400 03/08/20 0500 03/08/20 0600  BP: (!) 143/75 (!) 145/76 130/76 (!) 143/102  Pulse: 76 84 77 83  Resp: (!) 26 (!) 28 (!) 26 (!) 26  Temp:      TempSrc:      SpO2: 95% 96% 95% 96%  Weight:      Height:  No intake or output data in the 24 hours ending 03/08/20 0852 Filed Weights   03/06/20 1653  Weight: 104.3 kg     Data Reviewed:    CBC: Recent Labs  Lab 03/06/20 1800 03/07/20 1438 03/08/20 0500  WBC 2.8* 2.3* 3.7*  NEUTROABS 2.2 1.5* 3.0  HGB 13.1 12.0* 12.0*  HCT 39.6 37.4* 36.7*  MCV 88.6 90.1 89.1  PLT 150 125* 998*   Basic Metabolic Panel: Recent Labs  Lab 03/06/20 1800 03/07/20 1438 03/08/20 0500  NA 132* 136 133*  K 3.8 4.1 4.0  CL 100 104 102  CO2 22 24 19*  GLUCOSE 119* 96 105*  BUN 19 15 17   CREATININE 1.47* 1.45* 1.30*  CALCIUM 9.6 8.9 8.9  MG  --   --  1.5*  PHOS  --   --  2.7   GFR: Estimated Creatinine Clearance: 56.6 mL/min (A) (by C-G formula based on SCr of 1.3 mg/dL (H)). Liver Function Tests: Recent Labs  Lab 03/06/20 1800 03/07/20 1438 03/08/20 0500  AST 68* 50* 44*  ALT 64* 57* 51*  ALKPHOS 43 39 36*  BILITOT 0.6 0.7 0.7  PROT 8.2* 6.8 6.7  ALBUMIN 4.2 3.4* 3.4*   No results for input(s): LIPASE, AMYLASE in the last 168 hours. No results for input(s): AMMONIA in the last 168 hours. Coagulation Profile: Recent Labs  Lab 03/06/20 1800 03/08/20 0500  INR 1.8* 1.7*   Cardiac Enzymes: No results for input(s): CKTOTAL, CKMB, CKMBINDEX, TROPONINI in the last 168 hours. BNP (last 3 results) No results for input(s): PROBNP in the last 8760 hours. HbA1C: No results for input(s): HGBA1C in the last 72 hours. CBG: No results for input(s): GLUCAP in the last 168 hours. Lipid Profile: No results for input(s): CHOL, HDL, LDLCALC, TRIG, CHOLHDL, LDLDIRECT in the last 72 hours. Thyroid Function Tests: No results for input(s): TSH, T4TOTAL, FREET4, T3FREE, THYROIDAB in the last 72 hours. Anemia Panel: Recent Labs    03/07/20 1438 03/08/20 0500  FERRITIN 245 236   Sepsis Labs: Recent Labs  Lab 03/06/20 1759 03/06/20 1950 03/07/20 1438 03/08/20 0555  PROCALCITON  --   --  <0.10  --   LATICACIDVEN 2.1* 1.9 0.8 1.0    Recent Results (from the past 240 hour(s))  SARS Coronavirus 2 by RT PCR (hospital order, performed in [place] hospital lab)  Nasopharyngeal Nasopharyngeal Swab     Status: Abnormal   Collection Time: 03/06/20  5:02 PM   Specimen: Nasopharyngeal Swab  Result Value Ref Range Status   SARS Coronavirus 2 POSITIVE (A) NEGATIVE Final    Comment: RESULT CALLED TO, READ BACK BY AND VERIFIED WITH: [facility] [id number] PHILLIPS C (NOTE) SARS-CoV-2 target nucleic acids are DETECTED  SARS-CoV-2 RNA is generally detectable in upper respiratory specimens  during the acute phase of infection.  Positive results are indicative  of the presence of the identified virus, but do not rule out bacterial infection or co-infection with other pathogens not detected by the test.  Clinical correlation with patient history and  other diagnostic information is necessary to determine patient infection status.  The expected result is negative.  Fact Sheet for Patients:   StrictlyIdeas.no   Fact Sheet for Healthcare Providers:   BankingDealers.co.za    This test is not yet approved or cleared by the [place] FDA and  has been authorized for detection and/or diagnosis of SARS-CoV-2 by FDA under an Emergency Use Authorization (EUA).  This EUA will remain in  effect (meaning this tes t can be used) for the duration of  the COVID-19 declaration under Section 564(b)(1) of the Act, 21 U.S.C. section 360-bbb-3(b)(1), unless the authorization is terminated or revoked sooner.  Performed at [facility], [address] [place], [place] [address]   Blood culture (routine x 2)     Status: None (Preliminary result)   Collection Time: 03/06/20  6:00 PM   Specimen: BLOOD RIGHT HAND  Result Value Ref Range Status   Specimen Description   Final    BLOOD RIGHT HAND Performed at [facility], [address] [place], [place] [address]    Special Requests   Final    BOTTLES DRAWN AEROBIC AND ANAEROBIC Blood Culture results may not be optimal due to an inadequate volume of  blood received in culture bottles Performed at [facility], [address] [place], [place] [address]    Culture   Final    NO GROWTH 2 DAYS Performed at [facility], [place] [address] [place], [place] [address]    Report Status PENDING  Incomplete  Blood culture (routine x 2)     Status: None (Preliminary result)   Collection Time: 03/06/20  7:46 PM   Specimen: BLOOD LEFT FOREARM  Result Value Ref Range Status   Specimen Description   Final    BLOOD LEFT FOREARM Performed at [facility], [address] [place], [place] [address]    Special Requests   Final    BOTTLES DRAWN AEROBIC AND ANAEROBIC Blood Culture adequate volume Performed at [facility], [address] [place], [place] [address]    Culture   Final    NO GROWTH 2 DAYS Performed at [facility], [place] [address], [place], [place] [address]    Report Status PENDING  Incomplete         Radiology Studies: DG Chest Port 1 View  Result Date: 03/06/2020 CLINICAL DATA:  Questionable sepsis - evaluate for abnormality Tremors since chemo this morning. EXAM: PORTABLE CHEST 1 VIEW COMPARISON:  09/03/2018 FINDINGS: The cardiomediastinal contours are normal. Subsegmental opacities at both lung bases. Pulmonary vasculature is normal. No consolidation, pleural effusion, or pneumothorax. No acute osseous abnormalities are seen. No bands degenerative change in the shoulders. IMPRESSION: Subsegmental bibasilar atelectasis. Electronically Signed   By: Keith Rake M.D.   On: 03/06/2020 18:18        Scheduled Meds: . vitamin C  500 mg Oral Daily  . aspirin EC  81 mg Oral Daily  . rosuvastatin  20 mg Oral Daily  . Warfarin - Pharmacist Dosing Inpatient   Does not apply q1600  . zinc sulfate  220 mg Oral Daily   Continuous Infusions: . remdesivir 100 mg in NS 100 mL       LOS: 1 day   Time spent= 35 mins    Victor Cooper Arsenio Loader, MD [facility]  If 7PM-7AM, please  contact night-coverage  03/08/2020, 8:52 AM

## 2020-03-08 NOTE — ED Notes (Signed)
Provided pt with breakfast tray

## 2020-03-09 LAB — C-REACTIVE PROTEIN: CRP: 3.9 mg/dL — ABNORMAL HIGH (ref <1.0)

## 2020-03-09 LAB — COMPREHENSIVE METABOLIC PANEL
ALT: 45 U/L — ABNORMAL HIGH (ref 0–44)
AST: 36 U/L (ref 15–41)
Albumin: 3.2 g/dL — ABNORMAL LOW (ref 3.5–5.0)
Alkaline Phosphatase: 40 U/L (ref 38–126)
Anion gap: 9 (ref 5–15)
BUN: 20 mg/dL (ref 8–23)
CO2: 23 mmol/L (ref 22–32)
Calcium: 8.7 mg/dL — ABNORMAL LOW (ref 8.9–10.3)
Chloride: 101 mmol/L (ref 98–111)
Creatinine, Ser: 1.34 mg/dL — ABNORMAL HIGH (ref 0.61–1.24)
GFR calc Af Amer: 59 mL/min — ABNORMAL LOW (ref >60)
GFR calc non Af Amer: 51 mL/min — ABNORMAL LOW (ref >60)
Glucose, Bld: 141 mg/dL — ABNORMAL HIGH (ref 70–99)
Potassium: 4.2 mmol/L (ref 3.5–5.1)
Sodium: 133 mmol/L — ABNORMAL LOW (ref 135–145)
Total Bilirubin: 0.2 mg/dL — ABNORMAL LOW (ref 0.3–1.2)
Total Protein: 6.8 g/dL (ref 6.5–8.1)

## 2020-03-09 LAB — CBC WITH DIFFERENTIAL/PLATELET
Abs Immature Granulocytes: 0 10*3/uL (ref 0.00–0.07)
Basophils Absolute: 0 10*3/uL (ref 0.0–0.1)
Basophils Relative: 0 %
Eosinophils Absolute: 0 10*3/uL (ref 0.0–0.5)
Eosinophils Relative: 0 %
HCT: 36.3 % — ABNORMAL LOW (ref 39.0–52.0)
Hemoglobin: 12 g/dL — ABNORMAL LOW (ref 13.0–17.0)
Immature Granulocytes: 0 %
Lymphocytes Relative: 15 %
Lymphs Abs: 0.3 10*3/uL — ABNORMAL LOW (ref 0.7–4.0)
MCH: 28.8 pg (ref 26.0–34.0)
MCHC: 33.1 g/dL (ref 30.0–36.0)
MCV: 87.3 fL (ref 80.0–100.0)
Monocytes Absolute: 0.1 10*3/uL (ref 0.1–1.0)
Monocytes Relative: 3 %
Neutro Abs: 1.7 10*3/uL (ref 1.7–7.7)
Neutrophils Relative %: 82 %
Platelets: 140 10*3/uL — ABNORMAL LOW (ref 150–400)
RBC: 4.16 MIL/uL — ABNORMAL LOW (ref 4.22–5.81)
RDW: 13.7 % (ref 11.5–15.5)
WBC: 2.1 10*3/uL — ABNORMAL LOW (ref 4.0–10.5)
nRBC: 0 % (ref 0.0–0.2)

## 2020-03-09 LAB — BRAIN NATRIURETIC PEPTIDE: B Natriuretic Peptide: 13.5 pg/mL (ref 0.0–100.0)

## 2020-03-09 LAB — MAGNESIUM: Magnesium: 2.4 mg/dL (ref 1.7–2.4)

## 2020-03-09 LAB — D-DIMER, QUANTITATIVE: D-Dimer, Quant: 0.46 ug/mL-FEU (ref 0.00–0.50)

## 2020-03-09 LAB — PHOSPHORUS: Phosphorus: 2.5 mg/dL (ref 2.5–4.6)

## 2020-03-09 LAB — FERRITIN: Ferritin: 242 ng/mL (ref 24–336)

## 2020-03-09 LAB — PROTIME-INR
INR: 1.7 — ABNORMAL HIGH (ref 0.8–1.2)
Prothrombin Time: 19.1 seconds — ABNORMAL HIGH (ref 11.4–15.2)

## 2020-03-09 MED ORDER — ALBUTEROL SULFATE HFA 108 (90 BASE) MCG/ACT IN AERS: 2.0000 | INHALATION_SPRAY | Freq: Four times a day (QID) | RESPIRATORY_TRACT | 0 refills | Status: DC

## 2020-03-09 MED ORDER — ASCORBIC ACID 500 MG PO TABS
500.0000 mg | ORAL_TABLET | Freq: Every day | ORAL | 0 refills | Status: DC
Start: 2020-03-10 — End: 2021-09-27

## 2020-03-09 MED ORDER — ZINC SULFATE 220 (50 ZN) MG PO CAPS: 220.0000 mg | ORAL_CAPSULE | Freq: Every day | ORAL | 0 refills | Status: DC

## 2020-03-09 MED ORDER — PREDNISONE 20 MG PO TABS: 40.0000 mg | ORAL_TABLET | Freq: Every day | ORAL | 0 refills | Status: AC

## 2020-03-09 NOTE — Progress Notes (Signed)
Patient scheduled for outpatient Remdesivir infusions at 10am on Tuesday 9/7 and Wednesday 9/8 at [facility]. Please inform the patient to park at [place], as staff will be escorting the patient through the [place] entrance of the hospital.    There is a wave flag banner located near the entrance on [address]. Turn into this entrance and immediately turn left and park in 1 of the 5 designated Covid Infusion Parking spots. There is a phone number on the sign, please call and let the staff know what spot you are in and we will come out and get you. For questions call [phone].  Thanks.  * If patient is getting dropped off, you may have your ride pull up to the Main Entrance of [facility]. Please stay in the car and call [phone], staff will meet you at your car and escort you into the hospital and back to the clinic.

## 2020-03-09 NOTE — Discharge Instructions (Addendum)
Patient scheduled for outpatient Remdesivir infusions at 10am on Tuesday 9/7 and Wednesday 9/8 at [facility]. Please inform the patient to park at [place], as staff will be escorting the patient through the [place] entrance of the hospital.    There is a wave flag banner located near the entrance on [address]. Turn into this entrance and immediately turn left and park in 1 of the 5 designated Covid Infusion Parking spots. There is a phone number on the sign, please call and let the staff know what spot you are in and we will come out and get you. For questions call [phone].  Thanks.  * If patient is getting dropped off, you may have your ride pull up to the Main Entrance of [facility]. Please stay in the car and call [phone], staff will meet you at your car and escort you into the hospital and back to the clinic.

## 2020-03-09 NOTE — Discharge Summary (Signed)
Physician Discharge Summary  Victor Cooper XVQ:[id number] DOB: [birth year] DOA: 03/06/2020  PCP: Heywood Bene, PA-C  Admit date: 03/06/2020 Discharge date: 03/09/2020  Admitted From: Home Disposition: Home  Recommendations for Outpatient Follow-up:  1. Follow up with PCP in 1-2 weeks 2. Please obtain BMP/CBC in one week your next doctors visit.  3. Prescription for albuterol and prednisone prescribed 4. Outpatient remdesivir infusion for 2 more days, instructions given   Discharge Condition: Stable CODE STATUS: Full code Diet recommendation: 2 g salt  Brief/Interim Summary: 79 year old with history of bladder cancer currently on chemo, COPD, HTN, PE on Coumadin, hyperlipidemia came to the hospital with weakness fevers chills diagnosed with sepsis secondary to COVID-19 pneumonia. Upon admission also had mild transaminitis and renal insufficiency.  He was started on remdesivir and steroids.  During the hospitalization his symptoms greatly improved and did not require oxygen.  After discussion with the patient's daughter on the day of discharge she was arranged for outpatient follow-up at the remdesivir infusion center. Today patient is medically stable to be discharged.   Assessment & Plan:   Principal Problem:   Sepsis due to COVID-19 [facility]) Active Problems:   Hypertension   High cholesterol   COPD (chronic obstructive pulmonary disease) (HCC)   AKI (acute kidney injury) (HCC)   Leukopenia   Elevated liver enzymes   Pulmonary embolism (HCC)   Bladder cancer (HCC)   Asymptomatic bacteriuria  Sepsis secondary to COVID-19 pneumonia Acute hypoxic respiratory distress secondary to COVID-19 -Oxygen levels-currently on room air but patient is still tachypneic -Remdesivir-day 3/5, will arrange for 2 more days of outpatient infusion -prednisone p.o.-day 2/5, 3 more days of p.o. prednisone upon discharge -Vaccination status-not  vaccinated -Antibiotics-none -procalcitonin-negative -Chest x-ray-bibasilar infiltrates -Supportive care-antitussive, inhalers, I-S/flutter -CODE STATUS confirmed -Vitamin C & Zinc. Prone >16hrs/day.  -Routine: Labs have been reviewed including ferritin, LDH, CRP, d-dimer, fibrinogen.  Will need to trend this lab daily.  Mild transaminitis Renal insufficiency, Baseline Cr 1.1 -Admission Cr 1.4. This morning 1.3 around her baseline.  Follow-up patient -Transaminitis improved  Essential hypertension -Home Benicar   Hyperlipidemia -Daily Crestor  History of COPD -As needed bronchodilators  History of pulmonary embolism -On Coumadin, resume home regimen.  Follow-up with PCP.  Discharge INR levels 1.7  Bladder cancer, Papillary transitional cell carcinoma high-grade -On weekly chemo-gemcitabine. Follows at [facility] with Dr. Amalia Hailey  Body mass index is 32.23 kg/m.         Discharge Diagnoses:  Principal Problem:   Sepsis due to COVID-19 [facility]) Active Problems:   Hypertension   High cholesterol   COPD (chronic obstructive pulmonary disease) (HCC)   AKI (acute kidney injury) (HCC)   Leukopenia   Elevated liver enzymes   Pulmonary embolism (HCC)   Bladder cancer (HCC)   Asymptomatic bacteriuria    Subjective: Feels great no complaints, sitting up in the chair.  Discharge Exam: Vitals:   03/09/20 0729 03/09/20 0800  BP: 129/80 125/73  Pulse: 62 61  Resp: 20 20  Temp: 98 F (36.7 C)   SpO2: 96% 96%   Vitals:   03/09/20 0217 03/09/20 0324 03/09/20 0729 03/09/20 0800  BP:  130/73 129/80 125/73  Pulse:  65 62 61  Resp:  19 20 20   Temp: 99.2 F (37.3 C) 98.2 F (36.8 C) 98 F (36.7 C)   TempSrc: Oral Oral Oral   SpO2:  96% 96% 96%  Weight:  99 kg    Height:  5\' 9"  (1.753 m)  General: Pt is alert, awake, not in acute distress Cardiovascular: RRR, S1/S2 +, no rubs, no gallops Respiratory: CTA bilaterally, no wheezing, no rhonchi Abdominal:  Soft, NT, ND, bowel sounds + Extremities: no edema, no cyanosis  Discharge Instructions   Allergies as of 03/09/2020   No Known Allergies     Medication List    TAKE these medications   acetaminophen 650 MG CR tablet Commonly known as: TYLENOL Take 1,300 mg by mouth daily as needed for pain (arthritis).   albuterol 108 (90 Base) MCG/ACT inhaler Commonly known as: VENTOLIN HFA Inhale 2 puffs into the lungs every 6 (six) hours.   ascorbic acid 500 MG tablet Commonly known as: VITAMIN C Take 1 tablet (500 mg total) by mouth daily. Start taking on: March 10, 2020   aspirin EC 81 MG tablet Take 81 mg by mouth daily.   MENTHOL EX Apply 1 application topically daily as needed (knee pain).   olmesartan-hydrochlorothiazide 40-25 MG tablet Commonly known as: BENICAR HCT Take 1 tablet by mouth daily.   predniSONE 20 MG tablet Commonly known as: DELTASONE Take 2 tablets (40 mg total) by mouth daily with breakfast for 3 days. Start taking on: March 10, 2020   rosuvastatin 20 MG tablet Commonly known as: CRESTOR Take 20 mg by mouth daily.   Stiolto Respimat 2.5-2.5 MCG/ACT Aers Generic drug: Tiotropium Bromide-Olodaterol Inhale 1 puff into the lungs daily.   warfarin 5 MG tablet Commonly known as: COUMADIN Take 5-7.5 mg by mouth See admin instructions. Take 1 1/2 tablets (7.5 mg) on Monday, Wednesday, Friday mornings, take 1 tablet (5 mg) on Sunday, Tuesday, Thursday, Saturday mornings   zinc sulfate 220 (50 Zn) MG capsule Take 1 capsule (220 mg total) by mouth daily. Start taking on: March 10, 2020       Follow-up Information    Heywood Bene, [place]. Schedule an appointment as soon as possible for a visit in 2 week(s).   Specialty: Physician Assistant Contact information: [address] [place] [address] [phone]              No Known Allergies  You were cared for by a hospitalist during your hospital stay. If you have any  questions about your discharge medications or the care you received while you were in the hospital after you are discharged, you can call the unit and asked to speak with the hospitalist on call if the hospitalist that took care of you is not available. Once you are discharged, your primary care physician will handle any further medical issues. Please note that no refills for any discharge medications will be authorized once you are discharged, as it is imperative that you return to your primary care physician (or establish a relationship with a primary care physician if you do not have one) for your aftercare needs so that they can reassess your need for medications and monitor your lab values.   Procedures/Studies: DG Chest Port 1 View  Result Date: 03/06/2020 CLINICAL DATA:  Questionable sepsis - evaluate for abnormality Tremors since chemo this morning. EXAM: PORTABLE CHEST 1 VIEW COMPARISON:  09/03/2018 FINDINGS: The cardiomediastinal contours are normal. Subsegmental opacities at both lung bases. Pulmonary vasculature is normal. No consolidation, pleural effusion, or pneumothorax. No acute osseous abnormalities are seen. No bands degenerative change in the shoulders. IMPRESSION: Subsegmental bibasilar atelectasis. Electronically Signed   By: Keith Rake M.D.   On: 03/06/2020 18:18      The results of significant diagnostics  from this hospitalization (including imaging, microbiology, ancillary and laboratory) are listed below for reference.     Microbiology: Recent Results (from the past 240 hour(s))  SARS Coronavirus 2 by RT PCR (hospital order, performed in [facility] hospital lab) Nasopharyngeal Nasopharyngeal Swab     Status: Abnormal   Collection Time: 03/06/20  5:02 PM   Specimen: Nasopharyngeal Swab  Result Value Ref Range Status   SARS Coronavirus 2 POSITIVE (A) NEGATIVE Final    Comment: RESULT CALLED TO, READ BACK BY AND VERIFIED WITH: COBLE S RN [id number] PHILLIPS  C (NOTE) SARS-CoV-2 target nucleic acids are DETECTED  SARS-CoV-2 RNA is generally detectable in upper respiratory specimens  during the acute phase of infection.  Positive results are indicative  of the presence of the identified virus, but do not rule out bacterial infection or co-infection with other pathogens not detected by the test.  Clinical correlation with patient history and  other diagnostic information is necessary to determine patient infection status.  The expected result is negative.  Fact Sheet for Patients:   StrictlyIdeas.no   Fact Sheet for Healthcare Providers:   BankingDealers.co.za    This test is not yet approved or cleared by the [place] FDA and  has been authorized for detection and/or diagnosis of SARS-CoV-2 by FDA under an Emergency Use Authorization (EUA).  This EUA will remain in effect (meaning this tes t can be used) for the duration of  the COVID-19 declaration under Section 564(b)(1) of the Act, 21 U.S.C. section 360-bbb-3(b)(1), unless the authorization is terminated or revoked sooner.  Performed at [facility], [address] [place], [place] [address]   Blood culture (routine x 2)     Status: None (Preliminary result)   Collection Time: 03/06/20  6:00 PM   Specimen: BLOOD RIGHT HAND  Result Value Ref Range Status   Specimen Description   Final    BLOOD RIGHT HAND Performed at [facility], [address] [place], [place] [address]    Special Requests   Final    BOTTLES DRAWN AEROBIC AND ANAEROBIC Blood Culture results may not be optimal due to an inadequate volume of blood received in culture bottles Performed at [facility], [address] [place], [place] [address]    Culture   Final    NO GROWTH 3 DAYS Performed at [facility], [place] [address] [place], [place] [address]    Report Status PENDING  Incomplete  Blood culture (routine x 2)      Status: None (Preliminary result)   Collection Time: 03/06/20  7:46 PM   Specimen: BLOOD LEFT FOREARM  Result Value Ref Range Status   Specimen Description   Final    BLOOD LEFT FOREARM Performed at [facility], [address] [place], [place] [address]    Special Requests   Final    BOTTLES DRAWN AEROBIC AND ANAEROBIC Blood Culture adequate volume Performed at [facility], [address] [place], [place] [address]    Culture   Final    NO GROWTH 3 DAYS Performed at [facility], [place] [address] [place],  [address]    Report Status PENDING  Incomplete  Urine culture     Status: None   Collection Time: 03/06/20  8:15 PM   Specimen: In/Out Cath Urine  Result Value Ref Range Status   Specimen Description   Final  IN/OUT CATH URINE Performed at [facility], [address], [place], [place] [address]    Special Requests   Final    NONE Performed at [facility], [place] [place], [place] [address]    Culture   Final    NO GROWTH Performed at [facility], [place] [address], [place], [place] [address]    Report Status 03/08/2020 FINAL  Final     Labs: BNP (last 3 results) Recent Labs    03/07/20 1438 03/09/20 0336  BNP 17.6 30.0   Basic Metabolic Panel: Recent Labs  Lab 03/06/20 1800 03/07/20 1438 03/08/20 0500 03/09/20 0336  NA 132* 136 133* 133*  K 3.8 4.1 4.0 4.2  CL 100 104 102 101  CO2 22 24 19* 23  GLUCOSE 119* 96 105* 141*  BUN 19 15 17 20   CREATININE 1.47* 1.45* 1.30* 1.34*  CALCIUM 9.6 8.9 8.9 8.7*  MG  --   --  1.5* 2.4  PHOS  --   --  2.7 2.5   Liver Function Tests: Recent Labs  Lab 03/06/20 1800 03/07/20 1438 03/08/20 0500 03/09/20 0336  AST 68* 50* 44* 36  ALT 64* 57* 51* 45*  ALKPHOS 43 39 36* 40  BILITOT 0.6 0.7 0.7 0.2*  PROT 8.2* 6.8 6.7 6.8  ALBUMIN 4.2 3.4* 3.4* 3.2*   No results for input(s): LIPASE, AMYLASE in the last 168 hours. No results for  input(s): AMMONIA in the last 168 hours. CBC: Recent Labs  Lab 03/06/20 1800 03/07/20 1438 03/08/20 0500 03/09/20 0336  WBC 2.8* 2.3* 3.7* 2.1*  NEUTROABS 2.2 1.5* 3.0 1.7  HGB 13.1 12.0* 12.0* 12.0*  HCT 39.6 37.4* 36.7* 36.3*  MCV 88.6 90.1 89.1 87.3  PLT 150 125* 121* 140*   Cardiac Enzymes: No results for input(s): CKTOTAL, CKMB, CKMBINDEX, TROPONINI in the last 168 hours. BNP: Invalid input(s): POCBNP CBG: No results for input(s): GLUCAP in the last 168 hours. D-Dimer Recent Labs    03/08/20 0500 03/09/20 0336  DDIMER 0.53* 0.46   Hgb A1c No results for input(s): HGBA1C in the last 72 hours. Lipid Profile No results for input(s): CHOL, HDL, LDLCALC, TRIG, CHOLHDL, LDLDIRECT in the last 72 hours. Thyroid function studies No results for input(s): TSH, T4TOTAL, T3FREE, THYROIDAB in the last 72 hours.  Invalid input(s): FREET3 Anemia work up Recent Labs    03/08/20 0500 03/09/20 0336  FERRITIN 236 242   Urinalysis    Component Value Date/Time   COLORURINE YELLOW 03/06/2020 1743   APPEARANCEUR CLOUDY (A) 03/06/2020 1743   LABSPEC >1.030 (H) 03/06/2020 1743   PHURINE 5.5 03/06/2020 1743   GLUCOSEU NEGATIVE 03/06/2020 1743   HGBUR LARGE (A) 03/06/2020 1743   BILIRUBINUR NEGATIVE 03/06/2020 1743   KETONESUR NEGATIVE 03/06/2020 1743   PROTEINUR 100 (A) 03/06/2020 1743   UROBILINOGEN 1.0 01/07/2008 1316   NITRITE NEGATIVE 03/06/2020 1743   LEUKOCYTESUR NEGATIVE 03/06/2020 1743   Sepsis Labs Invalid input(s): PROCALCITONIN,  WBC,  LACTICIDVEN Microbiology Recent Results (from the past 240 hour(s))  SARS Coronavirus 2 by RT PCR (hospital order, performed in [place] hospital lab) Nasopharyngeal Nasopharyngeal Swab     Status: Abnormal   Collection Time: 03/06/20  5:02 PM   Specimen: Nasopharyngeal Swab  Result Value Ref Range Status   SARS Coronavirus 2 POSITIVE (A) NEGATIVE Final    Comment: RESULT CALLED TO, READ BACK BY AND VERIFIED WITH: [place] [phone] PHILLIPS C (  NOTE) SARS-CoV-2 target nucleic acids are DETECTED  SARS-CoV-2 RNA is generally detectable in upper respiratory specimens  during the acute phase of infection.  Positive results are indicative  of the presence of the identified virus, but do not rule out bacterial infection or co-infection with other pathogens not detected by the test.  Clinical correlation with patient history and  other diagnostic information is necessary to determine patient infection status.  The expected result is negative.  Fact Sheet for Patients:   StrictlyIdeas.no   Fact Sheet for Healthcare Providers:   BankingDealers.co.za    This test is not yet approved or cleared by the [place] FDA and  has been authorized for detection and/or diagnosis of SARS-CoV-2 by FDA under an Emergency Use Authorization (EUA).  This EUA will remain in effect (meaning this tes t can be used) for the duration of  the COVID-19 declaration under Section 564(b)(1) of the Act, 21 U.S.C. section 360-bbb-3(b)(1), unless the authorization is terminated or revoked sooner.  Performed at [facility], [address] [place], [place] [address]   Blood culture (routine x 2)     Status: None (Preliminary result)   Collection Time: 03/06/20  6:00 PM   Specimen: BLOOD RIGHT HAND  Result Value Ref Range Status   Specimen Description   Final    BLOOD RIGHT HAND Performed at [facility], [address] [place], [place] [address]    Special Requests   Final    BOTTLES DRAWN AEROBIC AND ANAEROBIC Blood Culture results may not be optimal due to an inadequate volume of blood received in culture bottles Performed at [facility], [address] [place], [place] [address]    Culture   Final    NO GROWTH 3 DAYS Performed at [facility], [place] [address] [place], [place] [address]    Report Status PENDING  Incomplete  Blood  culture (routine x 2)     Status: None (Preliminary result)   Collection Time: 03/06/20  7:46 PM   Specimen: BLOOD LEFT FOREARM  Result Value Ref Range Status   Specimen Description   Final    BLOOD LEFT FOREARM Performed at [facility], [address] [place], [place] [address]    Special Requests   Final    BOTTLES DRAWN AEROBIC AND ANAEROBIC Blood Culture adequate volume Performed at [facility], [address] [place], [place] [address]    Culture   Final    NO GROWTH 3 DAYS Performed at [facility], [place] [address] [place], [place] [address]    Report Status PENDING  Incomplete  Urine culture     Status: None   Collection Time: 03/06/20  8:15 PM   Specimen: In/Out Cath Urine  Result Value Ref Range Status   Specimen Description   Final    IN/OUT CATH URINE Performed at [facility], [address] [place],  [address]    Special Requests   Final    NONE Performed at [facility], [address] [place], [place] [address]    Culture   Final    NO GROWTH Performed at [facility], [place] [address] [place],  [address]    Report Status 03/08/2020 FINAL  Final     Time coordinating discharge:  I have spent 35 minutes face to face with the patient and on the  ward discussing the patients care, assessment, plan and disposition with other care givers. >50% of the time was devoted counseling the patient about the risks and benefits of treatment/Discharge disposition and coordinating care.   SIGNED:   Damita Lack, MD  [facility] 03/09/2020, 11:52 AM   If 7PM-7AM, please contact night-coverage

## 2020-03-10 ENCOUNTER — Ambulatory Visit (HOSPITAL_COMMUNITY)
Admit: 2020-03-10 | Discharge: 2020-03-10 | Disposition: A | Discharge status: Home / Self Care | Payer: Medicare HMO | Source: Ambulatory Visit | Attending: Pulmonary Disease | Admitting: Pulmonary Disease

## 2020-03-10 DIAGNOSIS — J1282 Pneumonia due to coronavirus disease 2019: Secondary | ICD-10-CM | POA: Diagnosis not present

## 2020-03-10 DIAGNOSIS — U071 COVID-19: Secondary | ICD-10-CM | POA: Diagnosis not present

## 2020-03-10 MED ORDER — ALBUTEROL SULFATE HFA 108 (90 BASE) MCG/ACT IN AERS: 2.0000 | INHALATION_SPRAY | Freq: Once | RESPIRATORY_TRACT | Status: DC | PRN

## 2020-03-10 MED ORDER — DIPHENHYDRAMINE HCL 50 MG/ML IJ SOLN: 50.0000 mg | Freq: Once | INTRAMUSCULAR | Status: DC | PRN

## 2020-03-10 MED ORDER — METHYLPREDNISOLONE SODIUM SUCC 125 MG IJ SOLR: 125.0000 mg | Freq: Once | INTRAMUSCULAR | Status: DC | PRN

## 2020-03-10 MED ORDER — EPINEPHRINE 0.3 MG/0.3ML IJ SOAJ: 0.3000 mg | Freq: Once | INTRAMUSCULAR | Status: DC | PRN

## 2020-03-10 MED ORDER — FAMOTIDINE IN NACL 20-0.9 MG/50ML-% IV SOLN: 20.0000 mg | Freq: Once | INTRAVENOUS | Status: DC | PRN

## 2020-03-10 MED ORDER — SODIUM CHLORIDE 0.9 % IV SOLN: INTRAVENOUS | Status: DC | PRN

## 2020-03-10 MED ORDER — SODIUM CHLORIDE 0.9 % IV SOLN
100.0000 mg | Freq: Once | INTRAVENOUS | Status: AC
  Administered 2020-03-10: 100 mg via INTRAVENOUS
  Filled 2020-03-10: qty 20

## 2020-03-10 NOTE — Progress Notes (Signed)
  Diagnosis: COVID-19  Physician: Joya Gaskins  Procedure: [facility] Med: remdesivir infusion - Provided patient with remdesivir fact sheet for patients, parents and caregivers prior to infusion.  Complications: No immediate complications noted.  Discharge: Discharged home   Babs Sciara 03/10/2020

## 2020-03-10 NOTE — Discharge Instructions (Signed)
10 Things You Can Do to Manage Your COVID-19 Symptoms at Home If you have possible or confirmed COVID-19: 1. Stay home from work and school. And stay away from other public places. If you must go out, avoid using any kind of public transportation, ridesharing, or taxis. 2. Monitor your symptoms carefully. If your symptoms get worse, call your healthcare provider immediately. 3. Get rest and stay hydrated. 4. If you have a medical appointment, call the healthcare provider ahead of time and tell them that you have or may have COVID-19. 5. For medical emergencies, call 911 and notify the dispatch personnel that you have or may have COVID-19. 6. Cover your cough and sneezes with a tissue or use the inside of your elbow. 7. Wash your hands often with soap and water for at least 20 seconds or clean your hands with an alcohol-based hand sanitizer that contains at least 60% alcohol. 8. As much as possible, stay in a specific room and away from other people in your home. Also, you should use a separate bathroom, if available. If you need to be around other people in or outside of the home, wear a mask. 9. Avoid sharing personal items with other people in your household, like dishes, towels, and bedding. 10. Clean all surfaces that are touched often, like counters, tabletops, and doorknobs. Use household cleaning sprays or wipes according to the label instructions. cdc.gov/coronavirus 01/02/2019 This information is not intended to replace advice given to you by your health care provider. Make sure you discuss any questions you have with your health care provider. Document Revised: 06/06/2019 Document Reviewed: 06/06/2019 Elsevier Patient Education  2020 Elsevier Inc.  

## 2020-03-11 ENCOUNTER — Ambulatory Visit (HOSPITAL_COMMUNITY)
Admit: 2020-03-11 | Discharge: 2020-03-11 | Disposition: A | Discharge status: Home / Self Care | Payer: Medicare HMO | Attending: Pulmonary Disease | Admitting: Pulmonary Disease

## 2020-03-11 DIAGNOSIS — U071 COVID-19: Secondary | ICD-10-CM | POA: Diagnosis not present

## 2020-03-11 LAB — CULTURE, BLOOD (ROUTINE X 2)
Culture: NO GROWTH
Culture: NO GROWTH
Special Requests: ADEQUATE

## 2020-03-11 MED ORDER — ALBUTEROL SULFATE HFA 108 (90 BASE) MCG/ACT IN AERS: 2.0000 | INHALATION_SPRAY | Freq: Once | RESPIRATORY_TRACT | Status: DC | PRN

## 2020-03-11 MED ORDER — METHYLPREDNISOLONE SODIUM SUCC 125 MG IJ SOLR: 125.0000 mg | Freq: Once | INTRAMUSCULAR | Status: DC | PRN

## 2020-03-11 MED ORDER — DIPHENHYDRAMINE HCL 50 MG/ML IJ SOLN: 50.0000 mg | Freq: Once | INTRAMUSCULAR | Status: DC | PRN

## 2020-03-11 MED ORDER — EPINEPHRINE 0.3 MG/0.3ML IJ SOAJ: 0.3000 mg | Freq: Once | INTRAMUSCULAR | Status: DC | PRN

## 2020-03-11 MED ORDER — SODIUM CHLORIDE 0.9 % IV SOLN: INTRAVENOUS | Status: DC | PRN

## 2020-03-11 MED ORDER — FAMOTIDINE IN NACL 20-0.9 MG/50ML-% IV SOLN: 20.0000 mg | Freq: Once | INTRAVENOUS | Status: DC | PRN

## 2020-03-11 MED ORDER — SODIUM CHLORIDE 0.9 % IV SOLN
100.0000 mg | Freq: Once | INTRAVENOUS | Status: AC
  Administered 2020-03-11: 100 mg via INTRAVENOUS
  Filled 2020-03-11: qty 20

## 2020-03-11 NOTE — Progress Notes (Signed)
  Diagnosis: COVID-19  Physician: Dr. Joya Gaskins   Procedure: [facility] Med: remdesivir infusion - Provided patient with remdesivir fact sheet for patients, parents and caregivers prior to infusion.  Complications: No immediate complications noted.  Discharge: Discharged home   Victor Cooper, Victor Cooper 03/11/2020

## 2020-03-11 NOTE — Discharge Instructions (Signed)
10 Things You Can Do to Manage Your COVID-19 Symptoms at Home If you have possible or confirmed COVID-19: 1. Stay home from work and school. And stay away from other public places. If you must go out, avoid using any kind of public transportation, ridesharing, or taxis. 2. Monitor your symptoms carefully. If your symptoms get worse, call your healthcare provider immediately. 3. Get rest and stay hydrated. 4. If you have a medical appointment, call the healthcare provider ahead of time and tell them that you have or may have COVID-19. 5. For medical emergencies, call 911 and notify the dispatch personnel that you have or may have COVID-19. 6. Cover your cough and sneezes with a tissue or use the inside of your elbow. 7. Wash your hands often with soap and water for at least 20 seconds or clean your hands with an alcohol-based hand sanitizer that contains at least 60% alcohol. 8. As much as possible, stay in a specific room and away from other people in your home. Also, you should use a separate bathroom, if available. If you need to be around other people in or outside of the home, wear a mask. 9. Avoid sharing personal items with other people in your household, like dishes, towels, and bedding. 10. Clean all surfaces that are touched often, like counters, tabletops, and doorknobs. Use household cleaning sprays or wipes according to the label instructions. cdc.gov/coronavirus 01/02/2019 This information is not intended to replace advice given to you by your health care provider. Make sure you discuss any questions you have with your health care provider. Document Revised: 06/06/2019 Document Reviewed: 06/06/2019 Elsevier Patient Education  2020 Elsevier Inc.  

## 2020-03-15 ENCOUNTER — Telehealth: Payer: Self-pay

## 2020-03-15 NOTE — Telephone Encounter (Signed)
Daughter  Called in to state that patient is "shaking real bad""  Questions whether this is remdisivir side effect. Told daughter that if she feels he needs to be seen then he should come in. He is close to [facility].

## 2020-03-17 ENCOUNTER — Emergency Department (HOSPITAL_BASED_OUTPATIENT_CLINIC_OR_DEPARTMENT_OTHER): Payer: Medicare HMO

## 2020-03-17 ENCOUNTER — Inpatient Hospital Stay (HOSPITAL_BASED_OUTPATIENT_CLINIC_OR_DEPARTMENT_OTHER)
Admission: EM | Admit: 2020-03-17 | Discharge: 2020-03-23 | DRG: 640 | Disposition: A | Discharge status: Home Health | Payer: Medicare HMO | Attending: Internal Medicine | Admitting: Internal Medicine

## 2020-03-17 ENCOUNTER — Other Ambulatory Visit: Payer: Self-pay

## 2020-03-17 ENCOUNTER — Encounter (HOSPITAL_BASED_OUTPATIENT_CLINIC_OR_DEPARTMENT_OTHER): Payer: Self-pay | Admitting: Emergency Medicine

## 2020-03-17 DIAGNOSIS — E785 Hyperlipidemia, unspecified: Secondary | ICD-10-CM | POA: Diagnosis present

## 2020-03-17 DIAGNOSIS — Z87891 Personal history of nicotine dependence: Secondary | ICD-10-CM

## 2020-03-17 DIAGNOSIS — R0602 Shortness of breath: Secondary | ICD-10-CM

## 2020-03-17 DIAGNOSIS — Z66 Do not resuscitate: Secondary | ICD-10-CM | POA: Diagnosis present

## 2020-03-17 DIAGNOSIS — Z79899 Other long term (current) drug therapy: Secondary | ICD-10-CM

## 2020-03-17 DIAGNOSIS — I10 Essential (primary) hypertension: Secondary | ICD-10-CM | POA: Diagnosis present

## 2020-03-17 DIAGNOSIS — U071 COVID-19: Secondary | ICD-10-CM

## 2020-03-17 DIAGNOSIS — R630 Anorexia: Secondary | ICD-10-CM | POA: Diagnosis present

## 2020-03-17 DIAGNOSIS — E871 Hypo-osmolality and hyponatremia: Principal | ICD-10-CM

## 2020-03-17 DIAGNOSIS — Z7901 Long term (current) use of anticoagulants: Secondary | ICD-10-CM

## 2020-03-17 DIAGNOSIS — R32 Unspecified urinary incontinence: Secondary | ICD-10-CM | POA: Diagnosis present

## 2020-03-17 DIAGNOSIS — Z6832 Body mass index (BMI) 32.0-32.9, adult: Secondary | ICD-10-CM

## 2020-03-17 DIAGNOSIS — E860 Dehydration: Secondary | ICD-10-CM | POA: Diagnosis present

## 2020-03-17 DIAGNOSIS — Z86711 Personal history of pulmonary embolism: Secondary | ICD-10-CM

## 2020-03-17 DIAGNOSIS — R0902 Hypoxemia: Secondary | ICD-10-CM

## 2020-03-17 DIAGNOSIS — Z8616 Personal history of COVID-19: Secondary | ICD-10-CM | POA: Diagnosis present

## 2020-03-17 DIAGNOSIS — J449 Chronic obstructive pulmonary disease, unspecified: Secondary | ICD-10-CM | POA: Diagnosis present

## 2020-03-17 DIAGNOSIS — Z7982 Long term (current) use of aspirin: Secondary | ICD-10-CM

## 2020-03-17 DIAGNOSIS — E861 Hypovolemia: Secondary | ICD-10-CM | POA: Diagnosis present

## 2020-03-17 DIAGNOSIS — C679 Malignant neoplasm of bladder, unspecified: Secondary | ICD-10-CM | POA: Diagnosis present

## 2020-03-17 DIAGNOSIS — E7800 Pure hypercholesterolemia, unspecified: Secondary | ICD-10-CM | POA: Diagnosis present

## 2020-03-17 DIAGNOSIS — J440 Chronic obstructive pulmonary disease with acute lower respiratory infection: Secondary | ICD-10-CM | POA: Diagnosis present

## 2020-03-17 DIAGNOSIS — E669 Obesity, unspecified: Secondary | ICD-10-CM | POA: Diagnosis present

## 2020-03-17 DIAGNOSIS — A09 Infectious gastroenteritis and colitis, unspecified: Secondary | ICD-10-CM

## 2020-03-17 DIAGNOSIS — N179 Acute kidney failure, unspecified: Secondary | ICD-10-CM | POA: Diagnosis present

## 2020-03-17 DIAGNOSIS — I2699 Other pulmonary embolism without acute cor pulmonale: Secondary | ICD-10-CM | POA: Diagnosis present

## 2020-03-17 HISTORY — DX: Malignant neoplasm of bladder, unspecified: C67.9

## 2020-03-17 LAB — COMPREHENSIVE METABOLIC PANEL
ALT: 38 U/L (ref 0–44)
AST: 54 U/L — ABNORMAL HIGH (ref 15–41)
Albumin: 3 g/dL — ABNORMAL LOW (ref 3.5–5.0)
Alkaline Phosphatase: 37 U/L — ABNORMAL LOW (ref 38–126)
Anion gap: 12 (ref 5–15)
BUN: 27 mg/dL — ABNORMAL HIGH (ref 8–23)
CO2: 19 mmol/L — ABNORMAL LOW (ref 22–32)
Calcium: 8.2 mg/dL — ABNORMAL LOW (ref 8.9–10.3)
Chloride: 96 mmol/L — ABNORMAL LOW (ref 98–111)
Creatinine, Ser: 1.59 mg/dL — ABNORMAL HIGH (ref 0.61–1.24)
GFR calc Af Amer: 48 mL/min — ABNORMAL LOW (ref >60)
GFR calc non Af Amer: 41 mL/min — ABNORMAL LOW (ref >60)
Glucose, Bld: 110 mg/dL — ABNORMAL HIGH (ref 70–99)
Potassium: 4.3 mmol/L (ref 3.5–5.1)
Sodium: 127 mmol/L — ABNORMAL LOW (ref 135–145)
Total Bilirubin: 0.9 mg/dL (ref 0.3–1.2)
Total Protein: 7.3 g/dL (ref 6.5–8.1)

## 2020-03-17 LAB — URINALYSIS, MICROSCOPIC (REFLEX): RBC / HPF: >50 RBC/hpf (ref 0–5)

## 2020-03-17 LAB — CBC WITH DIFFERENTIAL/PLATELET
Abs Immature Granulocytes: 0.07 10*3/uL (ref 0.00–0.07)
Basophils Absolute: 0 10*3/uL (ref 0.0–0.1)
Basophils Relative: 0 %
Eosinophils Absolute: 0 10*3/uL (ref 0.0–0.5)
Eosinophils Relative: 0 %
HCT: 39.6 % (ref 39.0–52.0)
Hemoglobin: 13.7 g/dL (ref 13.0–17.0)
Immature Granulocytes: 1 %
Lymphocytes Relative: 6 %
Lymphs Abs: 0.6 10*3/uL — ABNORMAL LOW (ref 0.7–4.0)
MCH: 29.8 pg (ref 26.0–34.0)
MCHC: 34.6 g/dL (ref 30.0–36.0)
MCV: 86.1 fL (ref 80.0–100.0)
Monocytes Absolute: 0.3 10*3/uL (ref 0.1–1.0)
Monocytes Relative: 3 %
Neutro Abs: 9.1 10*3/uL — ABNORMAL HIGH (ref 1.7–7.7)
Neutrophils Relative %: 90 %
Platelets: 227 10*3/uL (ref 150–400)
RBC: 4.6 MIL/uL (ref 4.22–5.81)
RDW: 15.2 % (ref 11.5–15.5)
WBC: 10.1 10*3/uL (ref 4.0–10.5)
nRBC: 0 % (ref 0.0–0.2)

## 2020-03-17 LAB — URINALYSIS, ROUTINE W REFLEX MICROSCOPIC
Bilirubin Urine: NEGATIVE
Glucose, UA: NEGATIVE mg/dL
Ketones, ur: NEGATIVE mg/dL
Leukocytes,Ua: NEGATIVE
Nitrite: NEGATIVE
Protein, ur: NEGATIVE mg/dL
Specific Gravity, Urine: 1.01 (ref 1.005–1.030)
pH: 6 (ref 5.0–8.0)

## 2020-03-17 LAB — PROTIME-INR
INR: 4.3 (ref 0.8–1.2)
Prothrombin Time: 40.3 seconds — ABNORMAL HIGH (ref 11.4–15.2)

## 2020-03-17 LAB — LACTIC ACID, PLASMA: Lactic Acid, Venous: 1.5 mmol/L (ref 0.5–1.9)

## 2020-03-17 LAB — APTT: aPTT: 109 seconds — ABNORMAL HIGH (ref 24–36)

## 2020-03-17 MED ORDER — VANCOMYCIN HCL 750 MG/150ML IV SOLN
750.0000 mg | Freq: Two times a day (BID) | INTRAVENOUS | Status: DC
  Administered 2020-03-18: 750 mg via INTRAVENOUS
  Filled 2020-03-17 (×2): qty 150

## 2020-03-17 MED ORDER — VANCOMYCIN HCL IN DEXTROSE 1-5 GM/200ML-% IV SOLN
1000.0000 mg | Freq: Once | INTRAVENOUS | Status: AC
  Administered 2020-03-17: 1000 mg via INTRAVENOUS
  Filled 2020-03-17: qty 200

## 2020-03-17 MED ORDER — DEXAMETHASONE SODIUM PHOSPHATE 10 MG/ML IJ SOLN
10.0000 mg | Freq: Once | INTRAMUSCULAR | Status: AC
  Administered 2020-03-17: 10 mg via INTRAVENOUS
  Filled 2020-03-17: qty 1

## 2020-03-17 MED ORDER — LACTATED RINGERS IV SOLN: INTRAVENOUS | Status: DC

## 2020-03-17 MED ORDER — SODIUM CHLORIDE 0.9 % IV SOLN
2.0000 g | Freq: Two times a day (BID) | INTRAVENOUS | Status: DC
  Administered 2020-03-18: 2 g via INTRAVENOUS
  Filled 2020-03-17 (×2): qty 2

## 2020-03-17 MED ORDER — SODIUM CHLORIDE 0.9 % IV SOLN
2.0000 g | Freq: Once | INTRAVENOUS | Status: AC
  Administered 2020-03-17: 2 g via INTRAVENOUS
  Filled 2020-03-17: qty 2

## 2020-03-17 MED ORDER — LACTATED RINGERS IV BOLUS (SEPSIS)
1000.0000 mL | Freq: Once | INTRAVENOUS | Status: AC
  Administered 2020-03-17: 1000 mL via INTRAVENOUS

## 2020-03-17 MED ORDER — SODIUM CHLORIDE 0.9 % IV SOLN: Freq: Once | INTRAVENOUS | Status: AC

## 2020-03-17 NOTE — ED Provider Notes (Addendum)
[facility] EMERGENCY DEPARTMENT Provider Note   CSN: [id number] Arrival date & time: 03/17/20  1124     History Chief Complaint  Patient presents with  . Fatigue    Covid +    Victor Cooper is a 79 y.o. male.  79 year old male dealing with Covid at home comes in with worsening fatigue shortness of breath.  He is hypoxic with EMS and placed on 2 L nasal cannula transported eyes.  Upon arrival he is hemodynamically with low blood pressure and heart rate in the 90s.  He had an EKG that showed sinus rhythm with no acute ischemic change interval abnormality or arrhythmia.        Past Medical History:  Diagnosis Date  . bladder   . COPD (chronic obstructive pulmonary disease) ([place])   . High cholesterol   . Hypertension   . Pulmonary embolism [facility])     Patient Active Problem List   Diagnosis Date Noted  . Hypertension   . High cholesterol   . COPD (chronic obstructive pulmonary disease) ([place])   . AKI (acute kidney injury) ([place])   . Leukopenia   . Elevated liver enzymes   . Pulmonary embolism ([place])   . Bladder cancer ([place])   . Asymptomatic bacteriuria   . Sepsis due to COVID-19 [facility]) 03/06/2020    Past Surgical History:  Procedure Laterality Date  . BLADDER SURGERY         No family history on file.  Social History   Tobacco Use  . Smoking status: Former Research scientist (life sciences)  . Smokeless tobacco: Never Used  Vaping Use  . Vaping Use: Never used  Substance Use Topics  . Alcohol use: No  . Drug use: Never    Home Medications Prior to Admission medications   Medication Sig Start Date End Date Taking? Authorizing Provider  acetaminophen (TYLENOL) 650 MG CR tablet Take 1,300 mg by mouth daily as needed for pain (arthritis).    [provider]  albuterol (VENTOLIN HFA) 108 (90 Base) MCG/ACT inhaler Inhale 2 puffs into the lungs every 6 (six) hours. 03/09/20   Amin, Victor Flattery, MD  ascorbic acid (VITAMIN C) 500 MG tablet Take 1 tablet (500 mg total) by  mouth daily. 03/10/20   Amin, Victor Flattery, MD  aspirin EC 81 MG tablet Take 81 mg by mouth daily.     [provider]  Menthol, Topical Analgesic, (MENTHOL EX) Apply 1 application topically daily as needed (knee pain).    [provider]  olmesartan-hydrochlorothiazide (BENICAR HCT) 40-25 MG tablet Take 1 tablet by mouth daily.  06/20/17   [provider]  rosuvastatin (CRESTOR) 20 MG tablet Take 20 mg by mouth daily.  09/26/17   [provider]  Tiotropium Bromide-Olodaterol (STIOLTO RESPIMAT) 2.5-2.5 MCG/ACT AERS Inhale 1 puff into the lungs daily.    [provider]  warfarin (COUMADIN) 5 MG tablet Take 5-7.5 mg by mouth See admin instructions. Take 1 1/2 tablets (7.5 mg) on Monday, Wednesday, Friday mornings, take 1 tablet (5 mg) on Sunday, Tuesday, Thursday, Saturday mornings 12/19/19   [provider]  zinc sulfate 220 (50 Zn) MG capsule Take 1 capsule (220 mg total) by mouth daily. 03/10/20   Victor Lack, MD    Allergies    Patient has no known allergies.  Review of Systems   Review of Systems  Physical Exam Updated Vital Signs BP 121/66   Pulse 87   Temp 99.6 F (37.6 C) (Oral)  Resp (!) 22   SpO2 98%   Physical Exam  ED Results / Procedures / Treatments   Labs (all labs ordered are listed, but only abnormal results are displayed) Labs Reviewed  COMPREHENSIVE METABOLIC PANEL - Abnormal; Notable for the following components:      Result Value   Sodium 127 (*)    Chloride 96 (*)    CO2 19 (*)    Glucose, Bld 110 (*)    BUN 27 (*)    Creatinine, Ser 1.59 (*)    Calcium 8.2 (*)    Albumin 3.0 (*)    AST 54 (*)    Alkaline Phosphatase 37 (*)    GFR calc non Af Amer 41 (*)    GFR calc Af Amer 48 (*)    All other components within normal limits  CBC WITH DIFFERENTIAL/PLATELET - Abnormal; Notable for the following components:   Neutro Abs 9.1 (*)    Lymphs Abs 0.6 (*)    All other components within normal limits   PROTIME-INR - Abnormal; Notable for the following components:   Prothrombin Time 40.3 (*)    INR 4.3 (*)    All other components within normal limits  APTT - Abnormal; Notable for the following components:   aPTT 109 (*)    All other components within normal limits  CULTURE, BLOOD (ROUTINE X 2)  CULTURE, BLOOD (ROUTINE X 2)  URINE CULTURE  LACTIC ACID, PLASMA  LACTIC ACID, PLASMA  URINALYSIS, ROUTINE W REFLEX MICROSCOPIC    EKG None  Radiology DG Chest Port 1 View  Result Date: 03/17/2020 CLINICAL DATA:  Question sepsis.  COVID-19 positive. EXAM: PORTABLE CHEST 1 VIEW COMPARISON:  03/06/2020 FINDINGS: Mild patchy perihilar airspace disease has developed since the prior study. Small right pleural effusion also new. No vascular congestion. Heart size within normal limits. IMPRESSION: Mild perihilar airspace disease bilaterally and small right effusion. Findings most likely due to COVID pneumonia. Electronically Signed   By: Franchot Gallo M.D.   On: 03/17/2020 12:45    Procedures Procedures (including critical care time)  Medications Ordered in ED Medications  vancomycin (VANCOCIN) IVPB 1000 mg/200 mL premix (has no administration in time range)  0.9 %  sodium chloride infusion (has no administration in time range)  lactated ringers bolus 1,000 mL (0 mLs Intravenous Stopped 03/17/20 1254)  vancomycin (VANCOCIN) IVPB 1000 mg/200 mL premix (1,000 mg Intravenous New Bag/Given 03/17/20 1222)  ceFEPIme (MAXIPIME) 2 g in sodium chloride 0.9 % 100 mL IVPB (0 g Intravenous Stopped 03/17/20 1240)  dexamethasone (DECADRON) injection 10 mg (10 mg Intravenous Given 03/17/20 1209)    ED Course  I have reviewed the triage vital signs and the nursing notes.  Pertinent labs & imaging results that were available during my care of the patient were reviewed by me and considered in my medical decision making (see chart for details).    MDM Rules/Calculators/A&P                          79 year old male dealing with Covid at home comes in with worsening fatigue shortness of breath.  He is hypoxic with EMS and placed on 2 L nasal cannula transported eyes.  Upon arrival he is hemodynamically with low blood pressure and heart rate in the 90s.  He had an EKG that showed sinus rhythm with no acute ischemic change interval abnormality or arrhythmia.  He is feeling lightheaded fatigue but denies any chest pain.  He has been afebrile here however he is taking Tylenol every 4 hours at home.  He completed inpatient treatment for Covid and was discharged home.  Did give steroids because he has a history of COPD.  He does not have any wheezing right now, with his hypotension we did give him a liter of lactated Ringer's, has lactic acid was negative.  He does not meet any sepsis criteria for septic shock at this point.  So we will continue to fluid manage him conservatively.  He was given broad-spectrum antibiotics blood cultures were obtained.  Chest x-ray reviewed shows no focal opacity however some diffuse interstitial disease could be consistent with Covid.  His blood pressure responded well to fluids.  He is found to be hyponatremic.  His creatinine is not significantly deranged from baseline.  He has a history of PE and is on Coumadin.  His INR supratherapeutic at 4.3 we will hold further Coumadin dosing.  He will need admission for further management of his hyponatremia and diarrhea shortness of breath.  I spoke to the hospitalist about admission, they recommended continue to rehydrate.  Rechecking labs in 24 hours.  To include an INR and a chemistry.  Continues to wait for the cultures result.  If he is improving in 24 hours he may be safe for discharge however he would need further monitoring for now.  Victor Cooper was evaluated in Emergency Department on 03/17/2020 for the symptoms described in the history of present illness. He was evaluated in the context of the global COVID-19 pandemic,  which necessitated consideration that the patient might be at risk for infection with the SARS-CoV-2 virus that causes COVID-19. Institutional protocols and algorithms that pertain to the evaluation of patients at risk for COVID-19 are in a state of rapid change based on information released by regulatory bodies including the CDC and federal and state organizations. These policies and algorithms were followed during the patient's care in the ED.  Final Clinical Impression(s) / ED Diagnoses Final diagnoses:  Hyponatremia  Diarrhea, infectious, adult  COVID-19  SOB (shortness of breath)  Hypoxic    Rx / DC Orders ED Discharge Orders    None       Breck Coons, MD 03/17/20 1325    Breck Coons, MD 03/17/20 1356

## 2020-03-17 NOTE — ED Triage Notes (Signed)
Pt from home per EMS, Covid + since 03/05/20. Lethargy per family , no shortness of breath yet O2 sat at 89 RA with EMS. A & O x 4. No further symptoms.

## 2020-03-17 NOTE — Progress Notes (Signed)
Pharmacy Antibiotic Note  Victor Cooper is a 79 y.o. male admitted on 03/17/2020 with pneumonia.  Pharmacy has been consulted for vancomycin and cefepime dosing. Pt is with Tmax 99.6 and WBC is WNL at 10.1. Scr is slightly above baseline at 1.59 and lactic acid is <2. Noted recent admission for COVID.   Plan: Vancomycin 2gm IV x 1 then 750mg  IV Q12H Cefepime 2gm IV Q12H F/u renal fxn, C&S, clinical status and trough at SS     Temp (24hrs), Avg:99.6 F (37.6 C), Min:99.6 F (37.6 C), Max:99.6 F (37.6 C)  Recent Labs  Lab 03/17/20 1151  WBC 10.1  CREATININE 1.59*  LATICACIDVEN 1.5    Estimated Creatinine Clearance: 45.1 mL/min (A) (by C-G formula based on SCr of 1.59 mg/dL (H)).    No Known Allergies  Antimicrobials this admission: Vanc 9/14>> Cefepime 9/14>>  Dose adjustments this admission: N/A  Microbiology results: Pending  Thank you for allowing pharmacy to be a part of this patient's care.  Dayane Hillenburg, Rande Lawman 03/17/2020 12:16 PM

## 2020-03-17 NOTE — Progress Notes (Signed)
Patient with h/o COPD; PE, on Coumadin; HTN; and HLD who tested + for COVID-19 on 9/2, presenting with lethargy and SOB.  Sats at home in low 80s, up to 90s with O2.  Unable to move himself from stretcher to bed.  Large amounts of diarrhea at home, none in ER.  Poor PO intake.  Hypovolemia, hyponatremia to 127, responded to IVF.  Previously treated with Remdesivir during prior hospitalization.  Given broad spectrum antibiotics.  Improving hemodynamics.  I suggested giving gentle infusion of 100 cc/hr IVF with probable improvement to d/c tomorrow.  If he gets a bed sooner, transfer is reasonable; otherwise, possible d/c from [facility] once improved.   Carlyon Shadow, M.D.

## 2020-03-17 NOTE — ED Notes (Signed)
Lactic canceled per EDP Ron Parker, 1st WNL

## 2020-03-18 ENCOUNTER — Encounter (HOSPITAL_COMMUNITY): Payer: Self-pay | Admitting: Internal Medicine

## 2020-03-18 DIAGNOSIS — E871 Hypo-osmolality and hyponatremia: Secondary | ICD-10-CM | POA: Diagnosis present

## 2020-03-18 DIAGNOSIS — E669 Obesity, unspecified: Secondary | ICD-10-CM | POA: Diagnosis present

## 2020-03-18 DIAGNOSIS — E860 Dehydration: Secondary | ICD-10-CM | POA: Diagnosis present

## 2020-03-18 DIAGNOSIS — R0902 Hypoxemia: Secondary | ICD-10-CM | POA: Diagnosis present

## 2020-03-18 DIAGNOSIS — J440 Chronic obstructive pulmonary disease with acute lower respiratory infection: Secondary | ICD-10-CM | POA: Diagnosis present

## 2020-03-18 DIAGNOSIS — Z79899 Other long term (current) drug therapy: Secondary | ICD-10-CM | POA: Diagnosis not present

## 2020-03-18 DIAGNOSIS — Z87891 Personal history of nicotine dependence: Secondary | ICD-10-CM | POA: Diagnosis not present

## 2020-03-18 DIAGNOSIS — R32 Unspecified urinary incontinence: Secondary | ICD-10-CM | POA: Diagnosis present

## 2020-03-18 DIAGNOSIS — U071 COVID-19: Secondary | ICD-10-CM | POA: Diagnosis present

## 2020-03-18 DIAGNOSIS — E861 Hypovolemia: Secondary | ICD-10-CM | POA: Diagnosis present

## 2020-03-18 DIAGNOSIS — E785 Hyperlipidemia, unspecified: Secondary | ICD-10-CM | POA: Diagnosis present

## 2020-03-18 DIAGNOSIS — I10 Essential (primary) hypertension: Secondary | ICD-10-CM | POA: Diagnosis present

## 2020-03-18 DIAGNOSIS — E7800 Pure hypercholesterolemia, unspecified: Secondary | ICD-10-CM | POA: Diagnosis present

## 2020-03-18 DIAGNOSIS — Z7901 Long term (current) use of anticoagulants: Secondary | ICD-10-CM | POA: Diagnosis not present

## 2020-03-18 DIAGNOSIS — Z66 Do not resuscitate: Secondary | ICD-10-CM | POA: Diagnosis present

## 2020-03-18 DIAGNOSIS — N179 Acute kidney failure, unspecified: Secondary | ICD-10-CM | POA: Diagnosis present

## 2020-03-18 DIAGNOSIS — Z86711 Personal history of pulmonary embolism: Secondary | ICD-10-CM | POA: Diagnosis not present

## 2020-03-18 DIAGNOSIS — Z6832 Body mass index (BMI) 32.0-32.9, adult: Secondary | ICD-10-CM | POA: Diagnosis not present

## 2020-03-18 DIAGNOSIS — Z7982 Long term (current) use of aspirin: Secondary | ICD-10-CM | POA: Diagnosis not present

## 2020-03-18 DIAGNOSIS — R630 Anorexia: Secondary | ICD-10-CM | POA: Diagnosis present

## 2020-03-18 DIAGNOSIS — A09 Infectious gastroenteritis and colitis, unspecified: Secondary | ICD-10-CM | POA: Diagnosis present

## 2020-03-18 DIAGNOSIS — C679 Malignant neoplasm of bladder, unspecified: Secondary | ICD-10-CM | POA: Diagnosis present

## 2020-03-18 LAB — URINE CULTURE: Culture: NO GROWTH

## 2020-03-18 LAB — BASIC METABOLIC PANEL
Anion gap: 10 (ref 5–15)
BUN: 27 mg/dL — ABNORMAL HIGH (ref 8–23)
CO2: 20 mmol/L — ABNORMAL LOW (ref 22–32)
Calcium: 8.2 mg/dL — ABNORMAL LOW (ref 8.9–10.3)
Chloride: 98 mmol/L (ref 98–111)
Creatinine, Ser: 1.03 mg/dL (ref 0.61–1.24)
GFR calc Af Amer: >60 mL/min (ref >60)
GFR calc non Af Amer: >60 mL/min (ref >60)
Glucose, Bld: 182 mg/dL — ABNORMAL HIGH (ref 70–99)
Potassium: 4.4 mmol/L (ref 3.5–5.1)
Sodium: 128 mmol/L — ABNORMAL LOW (ref 135–145)

## 2020-03-18 LAB — D-DIMER, QUANTITATIVE: D-Dimer, Quant: 0.66 ug/mL-FEU — ABNORMAL HIGH (ref 0.00–0.50)

## 2020-03-18 LAB — LACTATE DEHYDROGENASE: LDH: 216 U/L — ABNORMAL HIGH (ref 98–192)

## 2020-03-18 LAB — PROTIME-INR
INR: 5 (ref 0.8–1.2)
Prothrombin Time: 44.7 seconds — ABNORMAL HIGH (ref 11.4–15.2)

## 2020-03-18 LAB — C-REACTIVE PROTEIN: CRP: 25.2 mg/dL — ABNORMAL HIGH (ref <1.0)

## 2020-03-18 LAB — FIBRINOGEN: Fibrinogen: >800 mg/dL — ABNORMAL HIGH (ref 210–475)

## 2020-03-18 LAB — PROCALCITONIN: Procalcitonin: 0.59 ng/mL

## 2020-03-18 LAB — FERRITIN: Ferritin: 1380 ng/mL — ABNORMAL HIGH (ref 24–336)

## 2020-03-18 MED ORDER — ACETAMINOPHEN 325 MG PO TABS: 650.0000 mg | ORAL_TABLET | Freq: Four times a day (QID) | ORAL | Status: DC | PRN

## 2020-03-18 MED ORDER — WARFARIN - PHARMACIST DOSING INPATIENT: Freq: Every day | Status: DC

## 2020-03-18 MED ORDER — SODIUM CHLORIDE 0.9% FLUSH: 3.0000 mL | INTRAVENOUS | Status: DC | PRN

## 2020-03-18 MED ORDER — OXYCODONE HCL 5 MG PO TABS
5.0000 mg | ORAL_TABLET | ORAL | Status: DC | PRN
  Administered 2020-03-19: 5 mg via ORAL
  Filled 2020-03-18: qty 1

## 2020-03-18 MED ORDER — SODIUM CHLORIDE 0.9 % IV SOLN
2.0000 g | INTRAVENOUS | Status: DC
  Administered 2020-03-18 – 2020-03-19 (×2): 2 g via INTRAVENOUS
  Filled 2020-03-18 (×3): qty 20

## 2020-03-18 MED ORDER — SODIUM CHLORIDE 0.9% FLUSH: 3.0000 mL | Freq: Two times a day (BID) | INTRAVENOUS | Status: DC

## 2020-03-18 MED ORDER — SODIUM CHLORIDE 0.9 % IV SOLN: 250.0000 mL | INTRAVENOUS | Status: DC | PRN

## 2020-03-18 MED ORDER — SODIUM CHLORIDE 0.9 % IV SOLN: 2.0000 g | Freq: Three times a day (TID) | INTRAVENOUS | Status: DC

## 2020-03-18 MED ORDER — SODIUM CHLORIDE 0.9 % IV SOLN
INTRAVENOUS | Status: DC | PRN
  Administered 2020-03-18: 500 mL via INTRAVENOUS

## 2020-03-18 MED ORDER — ASPIRIN EC 81 MG PO TBEC
81.0000 mg | DELAYED_RELEASE_TABLET | Freq: Every day | ORAL | Status: DC
  Administered 2020-03-18 – 2020-03-23 (×6): 81 mg via ORAL
  Filled 2020-03-18 (×6): qty 1

## 2020-03-18 MED ORDER — HYDROCOD POLST-CPM POLST ER 10-8 MG/5ML PO SUER
5.0000 mL | Freq: Two times a day (BID) | ORAL | Status: DC | PRN
  Administered 2020-03-19: 5 mL via ORAL
  Filled 2020-03-18: qty 5

## 2020-03-18 MED ORDER — LACTATED RINGERS IV SOLN: INTRAVENOUS | Status: DC

## 2020-03-18 MED ORDER — ONDANSETRON HCL 4 MG/2ML IJ SOLN: 4.0000 mg | Freq: Four times a day (QID) | INTRAMUSCULAR | Status: DC | PRN

## 2020-03-18 MED ORDER — VANCOMYCIN HCL IN DEXTROSE 1-5 GM/200ML-% IV SOLN
INTRAVENOUS | Status: AC
  Administered 2020-03-18: 750 mg
  Filled 2020-03-18: qty 200

## 2020-03-18 MED ORDER — METHYLPREDNISOLONE SODIUM SUCC 125 MG IJ SOLR
50.0000 mg | Freq: Two times a day (BID) | INTRAMUSCULAR | Status: DC
  Administered 2020-03-18 – 2020-03-19 (×3): 50 mg via INTRAVENOUS
  Filled 2020-03-18 (×3): qty 2

## 2020-03-18 MED ORDER — ROSUVASTATIN CALCIUM 20 MG PO TABS
20.0000 mg | ORAL_TABLET | Freq: Every day | ORAL | Status: DC
  Administered 2020-03-18 – 2020-03-23 (×6): 20 mg via ORAL
  Filled 2020-03-18 (×6): qty 1

## 2020-03-18 MED ORDER — ZINC SULFATE 220 (50 ZN) MG PO CAPS
220.0000 mg | ORAL_CAPSULE | Freq: Every day | ORAL | Status: DC
  Administered 2020-03-18 – 2020-03-23 (×6): 220 mg via ORAL
  Filled 2020-03-18 (×6): qty 1

## 2020-03-18 MED ORDER — VANCOMYCIN HCL IN DEXTROSE 1-5 GM/200ML-% IV SOLN
1000.0000 mg | Freq: Two times a day (BID) | INTRAVENOUS | Status: DC
  Filled 2020-03-18: qty 200

## 2020-03-18 MED ORDER — ALBUTEROL SULFATE HFA 108 (90 BASE) MCG/ACT IN AERS
2.0000 | INHALATION_SPRAY | RESPIRATORY_TRACT | Status: DC | PRN
  Administered 2020-03-19 – 2020-03-20 (×2): 2 via RESPIRATORY_TRACT
  Filled 2020-03-18: qty 6.7

## 2020-03-18 MED ORDER — GUAIFENESIN-DM 100-10 MG/5ML PO SYRP
10.0000 mL | ORAL_SOLUTION | ORAL | Status: DC | PRN
  Administered 2020-03-18: 10 mL via ORAL
  Filled 2020-03-18: qty 10

## 2020-03-18 MED ORDER — SODIUM CHLORIDE 0.9 % IV SOLN
500.0000 mg | INTRAVENOUS | Status: DC
  Administered 2020-03-18 – 2020-03-19 (×2): 500 mg via INTRAVENOUS
  Filled 2020-03-18 (×4): qty 500

## 2020-03-18 MED ORDER — ONDANSETRON HCL 4 MG PO TABS: 4.0000 mg | ORAL_TABLET | Freq: Four times a day (QID) | ORAL | Status: DC | PRN

## 2020-03-18 MED ORDER — PREDNISONE 20 MG PO TABS: 50.0000 mg | ORAL_TABLET | Freq: Every day | ORAL | Status: DC

## 2020-03-18 MED ORDER — ASCORBIC ACID 500 MG PO TABS
500.0000 mg | ORAL_TABLET | Freq: Every day | ORAL | Status: DC
  Administered 2020-03-18 – 2020-03-23 (×6): 500 mg via ORAL
  Filled 2020-03-18 (×6): qty 1

## 2020-03-18 NOTE — H&P (Signed)
History and Physical    Victor Cooper:[id number] DOB: [birth year] DOA: 03/17/2020  PCP: Heywood Bene, PA-C Consultants:  Amalia Hailey - urology Patient coming from:  Home with wife; NOK: Daughter, Brahim Dolman, [phone]  Chief Complaint: weakness  HPI: Victor Cooper is a 79 y.o. male with medical history significant of COPD; PE, on Coumadin; bladder cancer, on active chemotherapy; HTN; and HLD who tested + for COVID-19 on 9/2, who presented to [facility] with lethargy and SOB.  He was previously hospitalized for COVID-19 PNA and sepsis from 9/3-6 and treated with Remdesivir (completed outpatient infusion) and steroids; he did not require oxygen at the time of discharge.  He reports that he was doing fairly well after d/c.  He uses a cane periodically, and would generally wait to move around much until his son got off work just to make sure the was safe.  However, his diarrhea has been ongoing and he hasn't been able to eat/drink much in the last few days.  He was increasingly weak and so decided to come back in.  He denies SOB.  Cough is unchanged.  EMS found his O2 sats to be in the 80s and he was placed on 2L [place] O2.  He has current bladder cancer.  He reports that he was having urinary incontinence at [facility] and so a condom catheter was placed.  He further reports that he has had so many procedures on his "little peckyweewee" in the last few years that it tries to "crawl up into" his stomach whenever he sees anyone wearing white or blue scrubs.    ED Course:  [facility] to [facility] transfer:  Sats at home in low 80s, up to 90s with O2.  Unable to move himself from stretcher to bed.  Large amounts of diarrhea at home, none in ER.  Poor PO intake.  Hypovolemia, hyponatremia to 127, responded to IVF.  Previously treated with Remdesivir during prior hospitalization.  Given broad spectrum antibiotics.  Improving hemodynamics.   Review of Systems: As per HPI; otherwise review of systems reviewed and  negative.   Ambulatory Status:  Ambulates without assistance or with a cane  COVID Vaccine Status:  None  Past Medical History:  Diagnosis Date  . Bladder cancer ([place])   . COPD (chronic obstructive pulmonary disease) ([place])   . High cholesterol   . Hypertension   . Pulmonary embolism [facility])     Past Surgical History:  Procedure Laterality Date  . BLADDER SURGERY      Social History   Socioeconomic History  . Marital status: Married    Spouse name: Not on file  . Number of children: Not on file  . Years of education: Not on file  . Highest education level: Not on file  Occupational History  . Not on file  Tobacco Use  . Smoking status: Former Research scientist (life sciences)  . Smokeless tobacco: Never Used  Vaping Use  . Vaping Use: Never used  Substance and Sexual Activity  . Alcohol use: No  . Drug use: Never  . Sexual activity: Not on file  Other Topics Concern  . Not on file  Social History Narrative  . Not on file   Social Determinants of Health   Financial Resource Strain:   . Difficulty of Paying Living Expenses: Not on file  Food Insecurity:   . Worried About Charity fundraiser in the Last Year: Not on file  . Ran Out of Food in the Last Year: Not on  file  Transportation Needs:   . Film/video editor (Medical): Not on file  . Lack of Transportation (Non-Medical): Not on file  Physical Activity:   . Days of Exercise per Week: Not on file  . Minutes of Exercise per Session: Not on file  Stress:   . Feeling of Stress : Not on file  Social Connections:   . Frequency of Communication with Friends and Family: Not on file  . Frequency of Social Gatherings with Friends and Family: Not on file  . Attends Religious Services: Not on file  . Active Member of Clubs or Organizations: Not on file  . Attends Archivist Meetings: Not on file  . Marital Status: Not on file  Intimate Partner Violence:   . Fear of Current or Ex-Partner: Not on file  . Emotionally Abused:  Not on file  . Physically Abused: Not on file  . Sexually Abused: Not on file    No Known Allergies  No family history on file.  Prior to Admission medications   Medication Sig Start Date End Date Taking? Authorizing Provider  acetaminophen (TYLENOL) 650 MG CR tablet Take 1,300 mg by mouth daily as needed for pain (arthritis).    [provider]  albuterol (VENTOLIN HFA) 108 (90 Base) MCG/ACT inhaler Inhale 2 puffs into the lungs every 6 (six) hours. 03/09/20   Amin, Jeanella Flattery, MD  ascorbic acid (VITAMIN C) 500 MG tablet Take 1 tablet (500 mg total) by mouth daily. 03/10/20   Amin, Jeanella Flattery, MD  aspirin EC 81 MG tablet Take 81 mg by mouth daily.     [provider]  Menthol, Topical Analgesic, (MENTHOL EX) Apply 1 application topically daily as needed (knee pain).    [provider]  olmesartan-hydrochlorothiazide (BENICAR HCT) 40-25 MG tablet Take 1 tablet by mouth daily.  06/20/17   [provider]  rosuvastatin (CRESTOR) 20 MG tablet Take 20 mg by mouth daily.  09/26/17   [provider]  Tiotropium Bromide-Olodaterol (STIOLTO RESPIMAT) 2.5-2.5 MCG/ACT AERS Inhale 1 puff into the lungs daily.    [provider]  warfarin (COUMADIN) 5 MG tablet Take 5-7.5 mg by mouth See admin instructions. Take 1 1/2 tablets (7.5 mg) on Monday, Wednesday, Friday mornings, take 1 tablet (5 mg) on Sunday, Tuesday, Thursday, Saturday mornings 12/19/19   [provider]  zinc sulfate 220 (50 Zn) MG capsule Take 1 capsule (220 mg total) by mouth daily. 03/10/20   Damita Lack, MD    Physical Exam: Vitals:   03/18/20 0600 03/18/20 0700 03/18/20 0800 03/18/20 1012  BP: 118/73 118/81 120/75 128/75  Pulse: 71 68 68 70  Resp: (!) 24 (!) 34 (!) 25 20  Temp:    97.8 F (36.6 C)  TempSrc:    Oral  SpO2: 97% 99% 96% 100%     . General:  Appears calm and comfortable and is NAD, on [place] O2 . Eyes:  EOMI, normal lids, iris . ENT:  grossly  normal hearing, lips & tongue, mmm; edentulous . Neck:  no LAD, masses or thyromegaly . Cardiovascular:  RRR, no m/r/g. No LE edema.  Marland Kitchen Respiratory:   CTA bilaterally with no wheezes/rales/rhonchi.  Mildly increased respiratory effort, on [place] O2 . Abdomen:  soft, NT, ND, NABS . Skin:  no rash or induration seen on limited exam . Musculoskeletal:  grossly normal tone BUE/BLE, good ROM, no bony abnormality . Psychiatric:  grossly normal mood and affect, speech fluent and appropriate,  AOx3 . Neurologic:  CN 2-12 grossly intact, moves all extremities in coordinated fashion    Radiological Exams on Admission: DG Chest Port 1 View  Result Date: 03/17/2020 CLINICAL DATA:  Question sepsis.  COVID-19 positive. EXAM: PORTABLE CHEST 1 VIEW COMPARISON:  03/06/2020 FINDINGS: Mild patchy perihilar airspace disease has developed since the prior study. Small right pleural effusion also new. No vascular congestion. Heart size within normal limits. IMPRESSION: Mild perihilar airspace disease bilaterally and small right effusion. Findings most likely due to COVID pneumonia. Electronically Signed   By: Franchot Gallo M.D.   On: 03/17/2020 12:45    EKG: Independently reviewed.  NSR with rate 95; LAFB; LVH; nonspecific ST changes with no evidence of acute ischemia; NSCSLT   Labs on Admission: I have personally reviewed the available labs and imaging studies at the time of the admission.  Pertinent labs:   Na++ 127 -> 128 Glucose 110 -> 182 BUN 27/Creatinine 1.59/GFR 41 -> 27/1.03/>60 Lactate 1.5 Normal CBC with lymphopenia INR 4.3 -> 5.0 UA: large Hgb, few bacteria LDH 216; 124 on 9/4 Ferritin 1380; 242 on 9/6 CRP 25.2; 3.9 on 9/6 D-dimer 0.66 Fibrinogen >800; 483 on 9/4 Procalcitonin 0.59   Assessment/Plan Principal Problem:   Hypovolemia due to dehydration Active Problems:   Hypertension   High cholesterol   COPD (chronic obstructive pulmonary disease) (HCC)   AKI (acute kidney injury)  ([place])   Pulmonary embolism (HCC)   Bladder cancer (HCC)   COVID-19 virus infection   AKI, hypovolemia due to dehydration -Patient with recent hospitalization for COVID-19 -He was doing better and then worsened -+diarrhea, evidence of dehydration and AKI on presentation (improved AKI) -Inflammatory markers are elevated compared to prior and CXR with new infiltrates -Will admit for resumption of steroids, gentle IVF hydration -BP is improved since admission but patient continues to have anorexia -Will give an additional 100 cc/hr for a total of 2 additional liters at this time  Acute hypoxic respiratory distress secondary to COVID-19 -Despite lack of significant c/o SOB, he was down to 88% on RA on presentation and remains on [place] O2 -CXR with progressive development of COVID-associated PNA -There is some concern for bacterial PNA and 24 hours after initiation of Cefepime and Vanc his procalcitonin is mildly elevated -Will de-escalate to Rocephin and Azithromycin but continue antibiotics at this time -He has completed Remdesivir but his inflammatory markers are significantly elevated from prior so will resume IV Solumedrol -Supportive care-antitussive, inhalers, I-S/flutter  Essential hypertension -Hold Benicar due to hypovolemia and AKI -Can add hydralazine if needed PRN but BP is currently stable  Hyperlipidemia -Continue Crestor  History of COPD -As needed bronchodilators  History of pulmonary embolism -On Coumadin, supratherapeutic INR -Coumadin dosing per pharmacy   Bladder cancer, Papillary transitional cell carcinoma high-grade -High-grade papillary transitional cell CA without evidence of stromal invasion -s/p TURBT with R ureteral stent placement on 08/02/19 -s/p induction therapy with BCG x 6 -On weekly chemo-gemcitabine.  -Last infusion was 9/3 -Follows at [facility] with Dr. Amalia Hailey -Will need to continue to hold further chemo while he is recovering from  COVID  Obesity -BMI 32.2 -Weight loss should be encouraged -Outpatient PCP/bariatric medicine f/u encouraged      DVT prophylaxis: Coumadin Code Status:  DNR - confirmed with patient Family Communication: None present; I spoke with the patient's daughter by telephone at the time of admission. Disposition Plan:  The patient is from: home  Anticipated d/c is to: home without [facility] services; he may  require home O2 at the time of d/c but that seems less likely at this time  Anticipated d/c date will depend on clinical response to treatment, likely several days  Patient is currently: acutely ill Consults called: PT/OT Admission status:  Admit - It is my clinical opinion that admission to INPATIENT is reasonable and necessary because of the expectation that this patient will require hospital care that crosses at least 2 midnights to treat this condition based on the medical complexity of the problems presented.  Given the aforementioned information, the predictability of an adverse outcome is felt to be significant.    Karmen Bongo MD [facility]   How to contact the [facility] Attending or Consulting provider [place] or covering provider during after hours [place], for this patient?  1. Check the care team in [facility] and look for a) attending/consulting TRH provider listed and b) the [facility] team listed 2. Log into [email or web address] and use [place]'s universal password to access. If you do not have the password, please contact the hospital operator. 3. Locate the [facility] provider you are looking for under [facility] and page to a number that you can be directly reached. 4. If you still have difficulty reaching the provider, please page the [facility] (Director on Call) for the Hospitalists listed on amion for assistance.   03/18/2020, 11:08 AM

## 2020-03-18 NOTE — Progress Notes (Signed)
Pharmacy Antibiotic Note/Coumadin  Victor Cooper is a 79 y.o. male admitted on 03/17/2020 with pneumonia.  Pharmacy has been consulted for vancomycin and cefepime dosing. Pt is with Tmax 99.6 and WBC is WNL at 10.1. Scr is slightly above baseline at 1.59 and lactic acid is <2. Noted recent admission for COVID.   Pt is now on D2 of vanc and cefepime for PNA. Scr has improved to 1. We will adjust his vanc/cefepime today.   Coumadin was ordered to be continued for his hx of DVT/PE. INR 5 today. We will continue to hold coumadin.   PTA coumadin>5mg  PO qday except for 7.5mg  MWF  Plan: Change vanc to 1g IV Q12H Change cefepime 2gm IV Q8H MRSA PCR F/u renal fxn, C&S, clinical status and trough at SS No coumadin today Daily INR    Temp (24hrs), Avg:98.6 F (37 C), Min:97.8 F (36.6 C), Max:99.6 F (37.6 C)  Recent Labs  Lab 03/17/20 1151 03/18/20 0602  WBC 10.1  --   CREATININE 1.59* 1.03  LATICACIDVEN 1.5  --     Estimated Creatinine Clearance: 69.7 mL/min (by C-G formula based on SCr of 1.03 mg/dL).    No Known Allergies  Antimicrobials this admission: Vanc 9/14>> Cefepime 9/14>> Remdesivir 9/4>9/8  Dose adjustments this admission: N/A  Microbiology results: 9/14 blood>> 9/14 urine>>  Onnie Boer, PharmD, [place], AAHIVP, CPP Infectious Disease Pharmacist 03/18/2020 12:51 PM

## 2020-03-18 NOTE — ED Notes (Signed)
Report given to Andee Poles RN receiving nurse at [facility] 5W bed 18

## 2020-03-18 NOTE — Evaluation (Signed)
Occupational Therapy Evaluation Patient Details Name: Victor Cooper MRN: [id number] DOB: Apr 08, 1941 Today's Date: 03/18/2020    History of Present Illness Pt is a 79 y.o. male who tested (+) COVID-19 on 03/05/20 with hospitalization at [facility] from 9/3-9/6 for COVID-19 PNA and sepsis (d/c home without O2), now readmitted 03/17/20 with diarrhea, weakness and decreased PO intanke. Found to be hypoxic, AKI. PMH includes HTN, COPD, PE, bladder CA.   Clinical Impression   PTA pt living with family and functioning at mod I level. He does endorse a recent period of weakness after recent admission and required more intermittent assist. Pt uses cane for mobility at baseline. At time of eval, pt presents with ability to complete bed mobility at supervision level and sit <> stands at min A +2 with RW. Pt is currently completing LB BADL at mod A level. He was able to complete functional mobility to toilet in room with min A for line management. Pt completed own posterior peri care with set up assist. Pt originally on 2L [place], trialed on RA for session with O2 sats <82% with RR up to mid 42s. 2L  returned at end of session with improvement. Given current status, recommend HHOT to support safety, BADL engagement, and independent PLOF. OT will continue to follow per POC listed below.     Follow Up Recommendations  Home health OT;Supervision/Assistance - 24 hour    Equipment Recommendations  3 in 1 bedside commode;Tub/shower seat    Recommendations for Other Services       Precautions / Restrictions Precautions Precautions: Fall Restrictions Weight Bearing Restrictions: No      Mobility Bed Mobility Overal bed mobility: Needs Assistance Bed Mobility: Supine to Sit     Supine to sit: Supervision;HOB elevated     General bed mobility comments: Increased time and effort, able to come to long sitting then scoot to EOB  Transfers Overall transfer level: Needs assistance Equipment used: None;Rolling  walker (2 wheeled) Transfers: Sit to/from Stand Sit to Stand: Min assist;+2 physical assistance         General transfer comment: Pt initially attempted stand without DME, reliant on mod A to achieve partial stand with fwd flexed posture and wide BOS. With RW introduced, pt able to complete stand with min A +2 due to UE support    Balance Overall balance assessment: Needs assistance Sitting-balance support: Feet supported Sitting balance-Leahy Scale: Fair     Standing balance support: Bilateral upper extremity supported;During functional activity Standing balance-Leahy Scale: Fair Standing balance comment: Can static stand briefly with close min guard; stability improved with UE support                           ADL either performed or assessed with clinical judgement   ADL Overall ADL's : Needs assistance/impaired Eating/Feeding: Set up;Sitting   Grooming: Min guard;Standing Grooming Details (indicate cue type and reason): at sink to wash hands after using toilet Upper Body Bathing: Minimal assistance;Sitting   Lower Body Bathing: Moderate assistance;Sit to/from stand;Sitting/lateral leans   Upper Body Dressing : Minimal assistance;Sitting   Lower Body Dressing: Moderate assistance;Sitting/lateral leans   Toilet Transfer: Minimal assistance;+2 for safety/equipment;Regular Toilet;RW Toilet Transfer Details (indicate cue type and reason): +2 for safety and equipment management. Pt able to complete mobility to toilet in room Toileting- Clothing Manipulation and Hygiene: Set up;Sitting/lateral lean Toileting - Clothing Manipulation Details (indicate cue type and reason): lateral lean to L for posterior peri  care with wash cloths after BM     Functional mobility during ADLs: Min guard;Minimal assistance;Cueing for safety;Cueing for sequencing;Rolling walker       Vision Patient Visual Report: No change from baseline       Perception     Praxis       Pertinent Vitals/Pain Pain Assessment: No/denies pain     Hand Dominance     Extremity/Trunk Assessment Upper Extremity Assessment Upper Extremity Assessment: Generalized weakness (reports shoulders are both "thrown out". Limited bil flexion, ER, abduction)   Lower Extremity Assessment Lower Extremity Assessment: Generalized weakness       Communication Communication Communication: HOH   Cognition Arousal/Alertness: Awake/alert Behavior During Therapy: WFL for tasks assessed/performed Overall Cognitive Status: No family/caregiver present to determine baseline cognitive functioning Area of Impairment: Following commands;Problem solving;Safety/judgement;Attention                   Current Attention Level: Selective   Following Commands: Follows multi-step commands inconsistently     Problem Solving: Requires verbal cues General Comments: suspect this to be baseline cognition and also complicated by pt HOH status and nature of OT PPE   General Comments  SpO2 92% on 2L O2 [place]; down to 76% on RA with ambulation (unsure if reliable pleth), returning to 90% on RA while using bathroom; 2L O2 replaced at end of session; RR up to 40s with activity    Exercises     Shoulder Instructions      Home Living Family/patient expects to be discharged to:: Private residence Living Arrangements: Spouse/significant other;Children (wife, son, 2 grand children) Available Help at Discharge: Family;Available PRN/intermittently;Available 24 hours/day (son must stay home from work due to +COVID; wife still works as Recruitment consultant) Type of Home: [place] Access: Stairs to enter [employer] of Steps: slight hill in front but no stairs; has stairs in back porch he does not use   Home Layout: One level     Bathroom Shower/Tub: Occupational psychologist: Alice - single point   Additional Comments: wife works as Recruitment consultant. pt is a retired  Producer, television/film/video. Son works but currently out since pt recent [place] dx. Went home on RA recent admission.      Prior Functioning/Environment Level of Independence: Independent with assistive device(s)        Comments: uses cane for mobility; pt helps out with IADL management as he can. Enjoys gardening. Has hx of bladder issues. As of recently, has not been mobilizing much due to weakness from recent admission        OT Problem List: Decreased strength;Decreased knowledge of use of DME or AE;Decreased activity tolerance;Cardiopulmonary status limiting activity;Impaired balance (sitting and/or standing);Decreased safety awareness      OT Treatment/Interventions: Self-care/ADL training;Therapeutic exercise;Patient/family education;Balance training;Energy conservation;Therapeutic activities;DME and/or AE instruction    OT Goals(Current goals can be found in the care plan section) Acute Rehab OT Goals Patient Stated Goal: not lose my ability to walk OT Goal Formulation: With patient Time For Goal Achievement: 04/01/20 Potential to Achieve Goals: Good  OT Frequency: Min 2X/week   Barriers to D/C:            Co-evaluation              AM-PAC OT "6 Clicks" Daily Activity     Outcome Measure Help from another person eating meals?: A Little Help from another person taking care of personal grooming?: A Little Help  from another person toileting, which includes using toliet, bedpan, or urinal?: A Lot Help from another person bathing (including washing, rinsing, drying)?: A Lot Help from another person to put on and taking off regular upper body clothing?: A Little Help from another person to put on and taking off regular lower body clothing?: A Lot 6 Click Score: 15   End of Session Equipment Utilized During Treatment: Gait belt;Rolling walker;Oxygen Nurse Communication: Mobility status  Activity Tolerance: Patient tolerated treatment well Patient left: in bed;with call bell/phone  within reach;with chair alarm set  OT Visit Diagnosis: Unsteadiness on feet (R26.81);Other abnormalities of gait and mobility (R26.89);Muscle weakness (generalized) (M62.81)                Time: 2493-2419 OT Time Calculation (min): 34 min Charges:  OT General Charges $OT Visit: 1 Visit OT Evaluation $OT Eval Moderate Complexity: [place], MSOT, OTR/L [place] [facility] Office Number: [phone] Pager: [phone]  Zenovia Jarred 03/18/2020, 6:14 PM

## 2020-03-18 NOTE — Evaluation (Signed)
Physical Therapy Evaluation Patient Details Name: Victor Cooper MRN: [id number] DOB: Mar 14, 1941 Today's Date: 03/18/2020   History of Present Illness  Pt is a 79 y.o. male who tested (+) COVID-19 on 03/05/20 with hospitalization at [facility] from 9/3-9/6 for COVID-19 PNA and sepsis (d/c home without O2), now readmitted 03/17/20 with diarrhea, weakness and decreased PO intanke. Found to be hypoxic, AKI. PMH includes HTN, COPD, PE, bladder CA.    Clinical Impression  Pt presents with an overall decrease in functional mobility secondary to above. PTA, pt mod independent with intermittent use of SPC, lives with wife, son and grandchildren; family assists with IADLs/ADLs as needed. Initiated education re: current conditions, O2 needs, activity recommendations, and importance of mobility. Today, pt able to perform transfers and ambulation with RW and intermittent minA. SpO2 down to 85% on RA with reliable pleth; maintaining >/90% on 2L O2; pt SOB and coughing with minimal activity. Pt would benefit from continued acute PT services to maximize functional mobility and independence prior to d/c with HHPT services.     Follow Up Recommendations Home health PT;Supervision/Assistance - 24 hour    Equipment Recommendations  Rolling walker with 5" wheels;3in1 (PT)    Recommendations for Other Services       Precautions / Restrictions Precautions Precautions: Fall Restrictions Weight Bearing Restrictions: No      Mobility  Bed Mobility Overal bed mobility: Needs Assistance Bed Mobility: Supine to Sit     Supine to sit: Supervision;HOB elevated     General bed mobility comments: Increased time and effort, able to come to long sitting then scoot to EOB  Transfers Overall transfer level: Needs assistance Equipment used: None;Rolling walker (2 wheeled) Transfers: Sit to/from Stand Sit to Stand: Min assist;+2 physical assistance         General transfer comment: Pt initially standing without DME,  reliant on min-modA to elevate trunk, but unable to achieve fully upright without UE support; additional trials from bed, recliner and low toilet height to RW, minA+1-2, hevay reliance on UE support  Ambulation/Gait Ambulation/Gait assistance: Min assist;Min guard;+2 safety/equipment Gait Distance (Feet): 40 Feet Assistive device: Rolling walker (2 wheeled) Gait Pattern/deviations: Step-through pattern;Decreased stride length;Trunk flexed Gait velocity: Decreased   General Gait Details: Slow, mildly unsteady gait to/from bathroom with RW and close min guard, 1x minA to correct LOB taking steps backwards into bathroom  Stairs            Wheelchair Mobility    Modified Rankin (Stroke Patients Only)       Balance Overall balance assessment: Needs assistance   Sitting balance-Leahy Scale: Fair       Standing balance-Leahy Scale: Fair Standing balance comment: Can static stand briefly with close min guard; stability improved with UE support                             Pertinent Vitals/Pain Pain Assessment: No/denies pain    Home Living Family/patient expects to be discharged to:: Private residence Living Arrangements: Spouse/significant other;Children (wife, son, and 2 grand children) Available Help at Discharge: Family;Available PRN/intermittently;Available 24 hours/day (son has to stay home due to pt being +COVID; wife still working as bus Geophysicist/field seismologist) Type of Home: House Home Access: Stairs to enter   [employer] of Steps: slight hill in front but no stairs; has stairs in back porch he does not use Home Layout: One level Home Equipment: Kasandra Knudsen - single point Additional Comments: wife works as  bus driver. pt is a retired Producer, television/film/video. Son works but currently out since pt recent [place] dx. Went home on RA recent admission.    Prior Function Level of Independence: Independent with assistive device(s)         Comments: uses cane for mobility; pt helps  out with IADL management as he can. Enjoys gardening. Has hx of bladder issues. As of recently, has not been mobilizing much due to weakness from recent admission     Hand Dominance        Extremity/Trunk Assessment   Upper Extremity Assessment Upper Extremity Assessment: Generalized weakness    Lower Extremity Assessment Lower Extremity Assessment: Generalized weakness       Communication   Communication: HOH  Cognition Arousal/Alertness: Awake/alert Behavior During Therapy: WFL for tasks assessed/performed Overall Cognitive Status: No family/caregiver present to determine baseline cognitive functioning Area of Impairment: Following commands;Problem solving;Safety/judgement;Attention                   Current Attention Level: Selective   Following Commands: Follows multi-step commands inconsistently     Problem Solving: Requires verbal cues General Comments: Potentially baseline cognitive impairment, likely exacerbated by pt HOH      General Comments General comments (skin integrity, edema, etc.): SpO2 92% on 2L O2 [place]; down to 76% on RA with ambulation (unsure if reliable pleth), returning to 90% on RA while using bathroom; 2L O2 replaced at end of session; RR up to 40s with activity    Exercises     Assessment/Plan    PT Assessment Patient needs continued PT services  PT Problem List Decreased strength;Decreased activity tolerance;Decreased balance;Decreased mobility;Decreased knowledge of use of DME;Cardiopulmonary status limiting activity       PT Treatment Interventions DME instruction;Gait training;Stair training;Functional mobility training;Therapeutic activities;Therapeutic exercise;Balance training;Patient/family education    PT Goals (Current goals can be found in the Care Plan section)  Acute Rehab PT Goals Patient Stated Goal: I'm happy to be walking again PT Goal Formulation: With patient Time For Goal Achievement: 04/01/20 Potential to  Achieve Goals: Good    Frequency Min 3X/week   Barriers to discharge        Co-evaluation               AM-PAC PT "6 Clicks" Mobility  Outcome Measure Help needed turning from your back to your side while in a flat bed without using bedrails?: None Help needed moving from lying on your back to sitting on the side of a flat bed without using bedrails?: A Little Help needed moving to and from a bed to a chair (including a wheelchair)?: A Little Help needed standing up from a chair using your arms (e.g., wheelchair or bedside chair)?: A Little Help needed to walk in hospital room?: A Little Help needed climbing 3-5 steps with a railing? : A Lot 6 Click Score: 18    End of Session Equipment Utilized During Treatment: Oxygen Activity Tolerance: Patient tolerated treatment well Patient left: in chair;with call bell/phone within reach;with chair alarm set Nurse Communication: Mobility status PT Visit Diagnosis: Other abnormalities of gait and mobility (R26.89)    Time: 0932-6712 PT Time Calculation (min) (ACUTE ONLY): 34 min   Charges:   PT Evaluation $PT Eval Moderate Complexity: 1 Mod     Mabeline Caras, PT, DPT Acute Rehabilitation Services  Pager [phone] Office [place] 03/18/2020, 5:03 PM

## 2020-03-19 ENCOUNTER — Encounter (HOSPITAL_COMMUNITY): Payer: Self-pay | Admitting: Internal Medicine

## 2020-03-19 LAB — COMPREHENSIVE METABOLIC PANEL
ALT: 45 U/L — ABNORMAL HIGH (ref 0–44)
AST: 52 U/L — ABNORMAL HIGH (ref 15–41)
Albumin: 2.3 g/dL — ABNORMAL LOW (ref 3.5–5.0)
Alkaline Phosphatase: 37 U/L — ABNORMAL LOW (ref 38–126)
Anion gap: 9 (ref 5–15)
BUN: 26 mg/dL — ABNORMAL HIGH (ref 8–23)
CO2: 21 mmol/L — ABNORMAL LOW (ref 22–32)
Calcium: 8.6 mg/dL — ABNORMAL LOW (ref 8.9–10.3)
Chloride: 102 mmol/L (ref 98–111)
Creatinine, Ser: 1.14 mg/dL (ref 0.61–1.24)
GFR calc Af Amer: >60 mL/min (ref >60)
GFR calc non Af Amer: >60 mL/min (ref >60)
Glucose, Bld: 195 mg/dL — ABNORMAL HIGH (ref 70–99)
Potassium: 4.5 mmol/L (ref 3.5–5.1)
Sodium: 132 mmol/L — ABNORMAL LOW (ref 135–145)
Total Bilirubin: 0.7 mg/dL (ref 0.3–1.2)
Total Protein: 6.4 g/dL — ABNORMAL LOW (ref 6.5–8.1)

## 2020-03-19 LAB — CBC WITH DIFFERENTIAL/PLATELET
Abs Immature Granulocytes: 0.06 10*3/uL (ref 0.00–0.07)
Basophils Absolute: 0 10*3/uL (ref 0.0–0.1)
Basophils Relative: 0 %
Eosinophils Absolute: 0 10*3/uL (ref 0.0–0.5)
Eosinophils Relative: 0 %
HCT: 33.5 % — ABNORMAL LOW (ref 39.0–52.0)
Hemoglobin: 11.4 g/dL — ABNORMAL LOW (ref 13.0–17.0)
Immature Granulocytes: 1 %
Lymphocytes Relative: 7 %
Lymphs Abs: 0.6 10*3/uL — ABNORMAL LOW (ref 0.7–4.0)
MCH: 29.5 pg (ref 26.0–34.0)
MCHC: 34 g/dL (ref 30.0–36.0)
MCV: 86.6 fL (ref 80.0–100.0)
Monocytes Absolute: 0.3 10*3/uL (ref 0.1–1.0)
Monocytes Relative: 4 %
Neutro Abs: 7.4 10*3/uL (ref 1.7–7.7)
Neutrophils Relative %: 88 %
Platelets: 259 10*3/uL (ref 150–400)
RBC: 3.87 MIL/uL — ABNORMAL LOW (ref 4.22–5.81)
RDW: 15.5 % (ref 11.5–15.5)
WBC: 8.4 10*3/uL (ref 4.0–10.5)
nRBC: 0 % (ref 0.0–0.2)

## 2020-03-19 LAB — BRAIN NATRIURETIC PEPTIDE: B Natriuretic Peptide: 25.2 pg/mL (ref 0.0–100.0)

## 2020-03-19 LAB — FERRITIN: Ferritin: 1354 ng/mL — ABNORMAL HIGH (ref 24–336)

## 2020-03-19 LAB — PROCALCITONIN: Procalcitonin: 0.28 ng/mL

## 2020-03-19 LAB — MAGNESIUM: Magnesium: 2.3 mg/dL (ref 1.7–2.4)

## 2020-03-19 LAB — PROTIME-INR
INR: 5 (ref 0.8–1.2)
Prothrombin Time: 45.3 seconds — ABNORMAL HIGH (ref 11.4–15.2)

## 2020-03-19 LAB — PHOSPHORUS: Phosphorus: 3.1 mg/dL (ref 2.5–4.6)

## 2020-03-19 LAB — D-DIMER, QUANTITATIVE: D-Dimer, Quant: 0.4 ug/mL-FEU (ref 0.00–0.50)

## 2020-03-19 LAB — C-REACTIVE PROTEIN: CRP: 14.6 mg/dL — ABNORMAL HIGH (ref <1.0)

## 2020-03-19 MED ORDER — PANTOPRAZOLE SODIUM 40 MG PO TBEC
40.0000 mg | DELAYED_RELEASE_TABLET | Freq: Every day | ORAL | Status: DC
  Administered 2020-03-19 – 2020-03-23 (×5): 40 mg via ORAL
  Filled 2020-03-19 (×5): qty 1

## 2020-03-19 MED ORDER — ACETAMINOPHEN 500 MG PO TABS: 1000.0000 mg | ORAL_TABLET | Freq: Four times a day (QID) | ORAL | Status: DC | PRN

## 2020-03-19 MED ORDER — METHYLPREDNISOLONE SODIUM SUCC 40 MG IJ SOLR
40.0000 mg | Freq: Every day | INTRAMUSCULAR | Status: DC
  Administered 2020-03-20: 40 mg via INTRAVENOUS
  Filled 2020-03-19: qty 1

## 2020-03-19 NOTE — Progress Notes (Addendum)
Pharmacy Antibiotic Note/Coumadin  Victor Cooper is a 79 y.o. male admitted on 03/17/2020 with pneumonia.  Pharmacy has been consulted for vancomycin and cefepime dosing. Pt is with Tmax 99.6 and WBC is WNL at 10.1. Scr is slightly above baseline at 1.59 and lactic acid is <2. Noted recent admission for COVID.   Pt is now on D3 of abx for PNA. Wbc wnl. PCT trended down to 0.28  Coumadin was ordered to be continued for his hx of DVT/PE. INR remains at 5 today. We will continue to hold coumadin.  INR goal: 2-3 PTA coumadin>5mg  PO qday except for 7.5mg  MWF  Plan: No coumadin today Daily INR    Temp (24hrs), Avg:97.7 F (36.5 C), Min:97.5 F (36.4 C), Max:97.8 F (36.6 C)  Recent Labs  Lab 03/17/20 1151 03/18/20 0602 03/19/20 0543  WBC 10.1  --  8.4  CREATININE 1.59* 1.03 1.14  LATICACIDVEN 1.5  --   --     Estimated Creatinine Clearance: 62.9 mL/min (by C-G formula based on SCr of 1.14 mg/dL).    No Known Allergies  Antimicrobials this admission: Vanc 9/14>>9/15 Cefepime 9/14>>9/15 Remdesivir 9/4>9/8 Ceftriaxone 9/15>> Azith 9/15>> Dose adjustments this admission: N/A  Microbiology results: 9/14 blood>>ngtd 9/14 urine>>negF  Onnie Boer, PharmD, BCIDP, AAHIVP, CPP Infectious Disease Pharmacist 03/19/2020 11:08 AM

## 2020-03-19 NOTE — Progress Notes (Signed)
Physical Therapy Treatment Patient Details Name: Victor Cooper MRN: [id number] DOB: 09-04-1940 Today's Date: 03/19/2020    History of Present Illness Pt is a 79 y.o. male who tested (+) COVID-19 on 03/05/20 with hospitalization at [facility] from 9/3-9/6 for COVID-19 PNA and sepsis (d/c home without O2), now readmitted 03/17/20 with diarrhea, weakness and decreased PO intake. Found to be hypoxic, AKI. PMH includes HTN, COPD, PE, bladder CA.   PT Comments    Pt progressing well with mobility. Able to perform gait training with RW, progressing to hallway ambulation this session with min guard for balance; SpO2 86-92% on 2L O2 [place]. Pt motivated to participate and happy to be walking. Spoke with NT about additional bouts of hallway ambulation throughout day. Will continue to follow acutely.   Follow Up Recommendations  Home health PT;Supervision/Assistance - 24 hour     Equipment Recommendations  Rolling walker with 5" wheels;3in1 (PT)    Recommendations for Other Services       Precautions / Restrictions Precautions Precautions: Fall Restrictions Weight Bearing Restrictions: No    Mobility  Bed Mobility Overal bed mobility: Needs Assistance Bed Mobility: Supine to Sit     Supine to sit: Supervision;HOB elevated     General bed mobility comments: Increased time and effort, able to come to long sitting then scoot to EOB  Transfers Overall transfer level: Needs assistance Equipment used: Rolling walker (2 wheeled) Transfers: Sit to/from Stand Sit to Stand: Min assist         General transfer comment: Increased time and effort, eventually requiring minA to achieve fully upright posture  Ambulation/Gait Ambulation/Gait assistance: Min guard Gait Distance (Feet): 150 Feet Assistive device: Rolling walker (2 wheeled) Gait Pattern/deviations: Step-through pattern;Decreased stride length;Trunk flexed Gait velocity: Decreased   General Gait Details: Slow, mostly steady gait with RW  and intermittent min guard for balance; SpO2 down to 86% on 2L O2 [place]; further distance limited by fatigue and SOB; pt very happy to be walking   Chief Strategy Officer    Modified Rankin (Stroke Patients Only)       Balance Overall balance assessment: Needs assistance Sitting-balance support: Feet supported Sitting balance-Leahy Scale: Fair     Standing balance support: Bilateral upper extremity supported;During functional activity Standing balance-Leahy Scale: Fair Standing balance comment: Can static stand without UE support; stability improved with RW                            Cognition Arousal/Alertness: Awake/alert Behavior During Therapy: WFL for tasks assessed/performed Overall Cognitive Status: No family/caregiver present to determine baseline cognitive functioning Area of Impairment: Following commands;Problem solving;Attention;Awareness                   Current Attention Level: Selective   Following Commands: Follows multi-step commands inconsistently   Awareness: Emergent Problem Solving: Requires verbal cues General Comments: suspect this to be baseline cognition and also complicated by pt HOH status      Exercises Other Exercises Other Exercises: Provided incentive spirometer - pt familiar with it from prior admission, good technique pulling 1250-1575mL x5 (limited by cough)    General Comments        Pertinent Vitals/Pain Pain Assessment: No/denies pain    Home Living                      Prior Function  PT Goals (current goals can now be found in the care plan section) Progress towards PT goals: Progressing toward goals    Frequency    Min 3X/week      PT Plan Current plan remains appropriate    Co-evaluation              AM-PAC PT "6 Clicks" Mobility   Outcome Measure  Help needed turning from your back to your side while in a flat bed without using bedrails?:  None Help needed moving from lying on your back to sitting on the side of a flat bed without using bedrails?: A Little Help needed moving to and from a bed to a chair (including a wheelchair)?: A Little Help needed standing up from a chair using your arms (e.g., wheelchair or bedside chair)?: A Little Help needed to walk in hospital room?: A Little Help needed climbing 3-5 steps with a railing? : A Little 6 Click Score: 19    End of Session Equipment Utilized During Treatment: Oxygen Activity Tolerance: Patient tolerated treatment well Patient left: in chair;with call bell/phone within reach;with chair alarm set Nurse Communication: Mobility status PT Visit Diagnosis: Other abnormalities of gait and mobility (R26.89)     Time: 2761-4709 PT Time Calculation (min) (ACUTE ONLY): 24 min  Charges:  $Gait Training: 8-22 mins $Therapeutic Exercise: 8-22 mins                     Mabeline Caras, PT, DPT Acute Rehabilitation Services  Pager [phone] Office [place] 03/19/2020, 10:09 AM

## 2020-03-19 NOTE — Progress Notes (Signed)
PROGRESS NOTE                                                                                                                                                                                                             Patient Demographics:    Victor Cooper, is a 79 y.o. male, DOB - 07/28/1940, UXL:[id number]  Outpatient Primary MD for the patient is Merwyn Katos    LOS - 1  Admit date - 03/17/2020    Chief Complaint  Patient presents with  . Fatigue    Covid +       Brief Narrative - Victor Cooper is a 79 y.o. male with medical history significant of COPD; PE, on Coumadin; bladder cancer, on active chemotherapy; HTN; and HLD who tested + for COVID-19 on 9/2, who presented to [facility] with lethargy and SOB.  He was previously hospitalized for COVID-19 PNA and sepsis from 9/3-6 and treated with Remdesivir (completed outpatient infusion) and steroids; he did not require oxygen at the time of discharge, comes back with dehydration, weakness and fevers.   Subjective:    Victor Cooper today has, No headache, No chest pain, No abdominal pain - No Nausea, No new weakness tingling or numbness, no cough no SOB.   Assessment  & Plan :     1. Recent Acute Covid 19 Viral Pneumonitis   -this was adequately treated on recent admission and seems to have resolved.  Encouraged the patient to sit up in chair in the daytime use I-S and flutter valve for pulmonary toiletry and then prone in bed when at night.  Will advance activity and titrate down oxygen as possible.    SpO2: 93 % O2 Flow Rate (L/min): 2 L/min  Recent Labs  Lab 03/17/20 1151 03/18/20 0602 03/18/20 1224 03/19/20 0543  WBC 10.1  --   --  8.4  PLT 227  --   --  259  CRP  --   --  25.2* 14.6*  BNP  --   --   --  25.2  DDIMER  --   --  0.66* 0.40  PROCALCITON  --   --  0.59 0.28  AST 54*  --   --  52*  ALT 38  --   --  45*  ALKPHOS 37*  --   --  37*   BILITOT 0.9  --   --  0.7  ALBUMIN 3.0*  --   --  2.3*  INR 4.3* 5.0*  --  5.0*  LATICACIDVEN 1.5  --   --   --       2.  Rigors at home along with dehydration and generalized weakness.  Likely due to UTI, continue antibiotics for now, procalcitonin was mildly elevated and CRP was high as well, agree with antibiotics and for now steroids due to recent history of Covid which can at times reinflamed, chances of which are low.  3.  History of bladder cancer.  Under the care of Dr. Amalia Hailey in [facility], undergoing bladder chemotherapy, outpatient monitoring no acute issues.  Does put him at high risk for UTIs.  4.  Dehydration with hypernatremia and AKI.  Hydrate with IV fluids.   5.  COPD.  No acute issues.  6.  Dyslipidemia.  On statins.  7.  History of PE.  On Coumadin.  Pharmacy monitoring INR.  Currently supratherapeutic.  No signs of bleeding.  8.  Obesity.  BMI greater than 32.  Follow with PCP for weight loss.    Condition -  Guarded  Family Communication  :  Daughter Victor Cooper, [phone] - 03/19/20  Code Status :  DNR  Consults  :  None  Procedures  :    PUD Prophylaxis :   Disposition Plan  :    Status is: Inpatient  Remains inpatient appropriate because:IV treatments appropriate due to intensity of illness or inability to take PO   Dispo: The patient is from: Home              Anticipated d/c is to: Home              Anticipated d/c date is: > 3 days              Patient currently is not medically stable to d/c.   DVT Prophylaxis  : Coumadin  Lab Results  Component Value Date   INR 5.0 (HH) 03/19/2020   INR 5.0 (HH) 03/18/2020   INR 4.3 (HH) 03/17/2020     Lab Results  Component Value Date   PLT 259 03/19/2020    Diet :  Diet Order            DIET SOFT Room service appropriate? Yes; Fluid consistency: Thin  Diet effective now                  Inpatient Medications  Scheduled Meds: . vitamin C  500 mg Oral Daily  . aspirin EC   81 mg Oral Daily  . [START ON 03/20/2020] methylPREDNISolone (SOLU-MEDROL) injection  40 mg Intravenous Daily  . rosuvastatin  20 mg Oral Daily  . Warfarin - Pharmacist Dosing Inpatient   Does not apply q1600  . zinc sulfate  220 mg Oral Daily   Continuous Infusions: . azithromycin 500 mg (03/18/20 1641)  . cefTRIAXone (ROCEPHIN)  IV 2 g (03/18/20 1424)   PRN Meds:.acetaminophen, albuterol, chlorpheniramine-HYDROcodone, guaiFENesin-dextromethorphan, [DISCONTINUED] ondansetron **OR** ondansetron (ZOFRAN) IV  Antibiotics  :    Anti-infectives (From admission, onward)   Start     Dose/Rate Route Frequency Ordered Stop   03/18/20 2000  ceFEPIme (MAXIPIME) 2 g in sodium chloride 0.9 % 100 mL IVPB  Status:  Discontinued        2 g 200 mL/hr over 30 Minutes Intravenous Every 8 hours 03/18/20 1238 03/18/20 1407  03/18/20 1500  cefTRIAXone (ROCEPHIN) 2 g in sodium chloride 0.9 % 100 mL IVPB        2 g 200 mL/hr over 30 Minutes Intravenous Every 24 hours 03/18/20 1407     03/18/20 1500  azithromycin (ZITHROMAX) 500 mg in sodium chloride 0.9 % 250 mL IVPB        500 mg 250 mL/hr over 60 Minutes Intravenous Every 24 hours 03/18/20 1407     03/18/20 1400  vancomycin (VANCOCIN) IVPB 1000 mg/200 mL premix  Status:  Discontinued        1,000 mg 200 mL/hr over 60 Minutes Intravenous Every 12 hours 03/18/20 1254 03/18/20 1407   03/18/20 0245  vancomycin (VANCOCIN) 1-5 GM/200ML-% IVPB       Note to Pharmacy: Mickie Hillier   : cabinet override      03/18/20 0245 03/18/20 0412   03/18/20 0200  vancomycin (VANCOREADY) IVPB 750 mg/150 mL  Status:  Discontinued        750 mg 150 mL/hr over 60 Minutes Intravenous Every 12 hours 03/17/20 1417 03/18/20 1254   03/18/20 0000  ceFEPIme (MAXIPIME) 2 g in sodium chloride 0.9 % 100 mL IVPB  Status:  Discontinued        2 g 200 mL/hr over 30 Minutes Intravenous Every 12 hours 03/17/20 1414 03/18/20 1238   03/17/20 1330  vancomycin (VANCOCIN) IVPB 1000 mg/200 mL  premix        1,000 mg 200 mL/hr over 60 Minutes Intravenous  Once 03/17/20 1212 03/17/20 1509   03/17/20 1200  vancomycin (VANCOCIN) IVPB 1000 mg/200 mL premix        1,000 mg 200 mL/hr over 60 Minutes Intravenous  Once 03/17/20 1148 03/17/20 1409   03/17/20 1200  ceFEPIme (MAXIPIME) 2 g in sodium chloride 0.9 % 100 mL IVPB        2 g 200 mL/hr over 30 Minutes Intravenous  Once 03/17/20 1148 03/17/20 1240       Time Spent in minutes  30   Lala Lund M.D on 03/19/2020 at 11:03 AM  To page go to [email or web address] - password [facility]  [facility] -  Office  [phone]   See all Orders from today for further details    Objective:   Vitals:   03/18/20 1606 03/18/20 2100 03/19/20 0400 03/19/20 0532  BP: 122/82 114/76 122/71 121/73  Pulse: 74 66 61 64  Resp: 19 (!) 24 20 (!) 23  Temp: 97.8 F (36.6 C) 97.7 F (36.5 C) (!) 97.5 F (36.4 C) 97.6 F (36.4 C)  TempSrc: Oral Oral Oral Oral  SpO2: 94% 94% 95% 93%    Wt Readings from Last 3 Encounters:  03/09/20 99 kg  10/10/17 103.4 kg  10/04/15 108 kg     Intake/Output Summary (Last 24 hours) at 03/19/2020 1103 Last data filed at 03/19/2020 0500 Gross per 24 hour  Intake 1458.88 ml  Output 1250 ml  Net 208.88 ml     Physical Exam  Awake Alert, No new F.N deficits, Normal affect [place].AT,PERRAL Supple Neck,No JVD, No cervical lymphadenopathy appriciated.  Symmetrical Chest wall movement, Good air movement bilaterally, CTAB RRR,No Gallops,Rubs or new Murmurs, No Parasternal Heave +ve B.Sounds, Abd Soft, No tenderness, No organomegaly appriciated, No rebound - guarding or rigidity. No Cyanosis, Clubbing or edema, No new Rash or bruise       Data Review:    CBC Recent Labs  Lab 03/17/20 1151 03/19/20 0543  WBC 10.1 8.4  HGB 13.7 11.4*  HCT 39.6 33.5*  PLT 227 259  MCV 86.1 86.6  MCH 29.8 29.5  MCHC 34.6 34.0  RDW 15.2 15.5  LYMPHSABS 0.6* 0.6*  MONOABS 0.3 0.3  EOSABS 0.0 0.0  BASOSABS 0.0 0.0     Recent Labs  Lab 03/17/20 1151 03/18/20 0602 03/18/20 1224 03/19/20 0543  NA 127* 128*  --  132*  K 4.3 4.4  --  4.5  CL 96* 98  --  102  CO2 19* 20*  --  21*  GLUCOSE 110* 182*  --  195*  BUN 27* 27*  --  26*  CREATININE 1.59* 1.03  --  1.14  CALCIUM 8.2* 8.2*  --  8.6*  AST 54*  --   --  52*  ALT 38  --   --  45*  ALKPHOS 37*  --   --  37*  BILITOT 0.9  --   --  0.7  ALBUMIN 3.0*  --   --  2.3*  MG  --   --   --  2.3  CRP  --   --  25.2* 14.6*  DDIMER  --   --  0.66* 0.40  PROCALCITON  --   --  0.59 0.28  LATICACIDVEN 1.5  --   --   --   INR 4.3* 5.0*  --  5.0*  BNP  --   --   --  25.2    ------------------------------------------------------------------------------------------------------------------ No results for input(s): CHOL, HDL, LDLCALC, TRIG, CHOLHDL, LDLDIRECT in the last 72 hours.  No results found for: HGBA1C ------------------------------------------------------------------------------------------------------------------ No results for input(s): TSH, T4TOTAL, T3FREE, THYROIDAB in the last 72 hours.  Invalid input(s): FREET3  Cardiac Enzymes No results for input(s): CKMB, TROPONINI, MYOGLOBIN in the last 168 hours.  Invalid input(s): CK ------------------------------------------------------------------------------------------------------------------    Component Value Date/Time   BNP 25.2 03/19/2020 0543    Micro Results Recent Results (from the past 240 hour(s))  Blood Culture (routine x 2)     Status: None (Preliminary result)   Collection Time: 03/17/20 11:48 AM   Specimen: BLOOD LEFT WRIST  Result Value Ref Range Status   Specimen Description   Final    BLOOD LEFT WRIST Performed at [facility], [address] [place], [place] [address]    Special Requests   Final    BOTTLES DRAWN AEROBIC AND ANAEROBIC Blood Culture adequate volume Performed at [facility], [address] [place], [place] [address]     Culture   Final    NO GROWTH < 24 HOURS Performed at [facility], [place] [address], [place], [place] [address]    Report Status PENDING  Incomplete  Blood Culture (routine x 2)     Status: None (Preliminary result)   Collection Time: 03/17/20 12:25 PM   Specimen: BLOOD RIGHT FOREARM  Result Value Ref Range Status   Specimen Description   Final    BLOOD RIGHT FOREARM Performed at [facility], [address] [place], [place] [address]    Special Requests   Final    BOTTLES DRAWN AEROBIC AND ANAEROBIC Blood Culture results may not be optimal due to an inadequate volume of blood received in culture bottles Performed at [facility], [address] [place], [place] [address]    Culture   Final    NO GROWTH < 24 HOURS Performed at [facility], [place] [address] [place], [place] [address]  Report Status PENDING  Incomplete  Urine culture     Status: None   Collection Time: 03/17/20  4:12 PM   Specimen: In/Out Cath Urine  Result Value Ref Range Status   Specimen Description   Final    IN/OUT CATH URINE Performed at [facility], [address] [place], [place] [address]    Special Requests   Final    NONE Performed at [facility], [address] [place], [place] [address]    Culture   Final    NO GROWTH Performed at [facility], [place] [address], [place],  [address]    Report Status 03/18/2020 FINAL  Final    Radiology Reports DG Chest Port 1 View  Result Date: 03/17/2020 CLINICAL DATA:  Question sepsis.  COVID-19 positive. EXAM: PORTABLE CHEST 1 VIEW COMPARISON:  03/06/2020 FINDINGS: Mild patchy perihilar airspace disease has developed since the prior study. Small right pleural effusion also new. No vascular congestion. Heart size within normal limits. IMPRESSION: Mild perihilar airspace disease bilaterally and small right effusion. Findings most likely due to COVID pneumonia. Electronically Signed   By:  Franchot Gallo M.D.   On: 03/17/2020 12:45   DG Chest Port 1 View  Result Date: 03/06/2020 CLINICAL DATA:  Questionable sepsis - evaluate for abnormality Tremors since chemo this morning. EXAM: PORTABLE CHEST 1 VIEW COMPARISON:  09/03/2018 FINDINGS: The cardiomediastinal contours are normal. Subsegmental opacities at both lung bases. Pulmonary vasculature is normal. No consolidation, pleural effusion, or pneumothorax. No acute osseous abnormalities are seen. No bands degenerative change in the shoulders. IMPRESSION: Subsegmental bibasilar atelectasis. Electronically Signed   By: Keith Rake M.D.   On: 03/06/2020 18:18

## 2020-03-19 NOTE — TOC Initial Note (Signed)
Transition of Care [facility]) - Initial/Assessment Note    Patient Details  Name: Victor Cooper MRN: [id number] Date of Birth: 1941-06-11  Transition of Care [facility]) CM/SW Contact:    Verdell Carmine, RN Phone Number: 03/19/2020, 10:24 AM  Clinical Narrative:                 Admitted with COVID pneumonia currently on oxygen. PT recommendations noted. Spoke to patient about needs. DME ordered, patient has no trouble with transportation, address verified.  Patient prefers to go with Insurance preference for [facility].  CM will continue to follow for needs, may need oxygen at home.   Expected Discharge Plan: [place] Barriers to Discharge: Continued Medical Work up   Patient Goals and CMS Choice        Expected Discharge Plan and Services Expected Discharge Plan: [place]   Discharge Planning Services: CM Consult   Living arrangements for the past 2 months: Single Family Home                 DME Arranged: Gilford Rile, 3-N-1 DME Agency: [employer] Date DME Agency Contacted: 03/19/20 Time DME Agency Contacted: [phone] Representative spoke with at DME Agency: Freda Munro            Prior Living Arrangements/Services Living arrangements for the past 2 months: [place] Lives with:: Spouse, Relatives Patient language and need for interpreter reviewed:: Yes        Need for Family Participation in Patient Care: Yes (Comment) Care giver support system in place?: Yes (comment) Current home services: DME ([facility]) Criminal Activity/Legal Involvement Pertinent to Current Situation/Hospitalization: No - Comment as needed  Activities of Daily Living      Permission Sought/Granted Permission sought to share information with : Case Manager Permission granted to share information with : Yes, Verbal Permission Granted     Permission granted to share info w AGENCY: HOme health agencies        Emotional Assessment   Attitude/Demeanor/Rapport:  Gracious Affect (typically observed): Appropriate Orientation: : Oriented to Self, Oriented to Place, Oriented to  Time, Oriented to Situation Alcohol / Substance Use: Not Applicable Psych Involvement: No (comment)  Admission diagnosis:  Hyponatremia [E87.1] SOB (shortness of breath) [R06.02] Hypoxic [R09.02] Diarrhea, infectious, adult [A09] Hypovolemia due to dehydration [E86.1, E86.0] COVID-19 virus infection [U07.1] COVID-19 [U07.1] Patient Active Problem List   Diagnosis Date Noted  . Hypovolemia due to dehydration 03/17/2020  . COVID-19 virus infection 03/17/2020  . Hypertension   . High cholesterol   . COPD (chronic obstructive pulmonary disease) ([place])   . AKI (acute kidney injury) ([place])   . Leukopenia   . Elevated liver enzymes   . Pulmonary embolism ([place])   . Bladder cancer ([place])   . Asymptomatic bacteriuria   . Sepsis due to COVID-19 [facility]) 03/06/2020   PCP:  Heywood Bene, PA-C Pharmacy:   [place], [place] [address] Phone: [phone] Fax: [phone]     Social Determinants of Health (SDOH) Interventions    Readmission Risk Interventions No flowsheet data found.

## 2020-03-19 NOTE — Plan of Care (Signed)
  Problem: Clinical Measurements: Goal: Will remain free from infection Outcome: Progressing   Problem: Nutrition: Goal: Adequate nutrition will be maintained Outcome: Progressing   Problem: Education: Goal: Knowledge of risk factors and measures for prevention of condition will improve Outcome: Progressing   Problem: Respiratory: Goal: Will maintain a patent airway Outcome: Progressing

## 2020-03-19 NOTE — Progress Notes (Signed)
Patient resting on room air with oxygen sats 94%, ambulated to the bathroom on room air and sats dropped to 85%, returned back to 90 % once resting on the toilet.

## 2020-03-20 LAB — COMPREHENSIVE METABOLIC PANEL
ALT: 53 U/L — ABNORMAL HIGH (ref 0–44)
AST: 47 U/L — ABNORMAL HIGH (ref 15–41)
Albumin: 2.3 g/dL — ABNORMAL LOW (ref 3.5–5.0)
Alkaline Phosphatase: 36 U/L — ABNORMAL LOW (ref 38–126)
Anion gap: 11 (ref 5–15)
BUN: 31 mg/dL — ABNORMAL HIGH (ref 8–23)
CO2: 22 mmol/L (ref 22–32)
Calcium: 8.5 mg/dL — ABNORMAL LOW (ref 8.9–10.3)
Chloride: 102 mmol/L (ref 98–111)
Creatinine, Ser: 1.25 mg/dL — ABNORMAL HIGH (ref 0.61–1.24)
GFR calc Af Amer: >60 mL/min (ref >60)
GFR calc non Af Amer: 55 mL/min — ABNORMAL LOW (ref >60)
Glucose, Bld: 185 mg/dL — ABNORMAL HIGH (ref 70–99)
Potassium: 4.7 mmol/L (ref 3.5–5.1)
Sodium: 135 mmol/L (ref 135–145)
Total Bilirubin: 0.6 mg/dL (ref 0.3–1.2)
Total Protein: 6.2 g/dL — ABNORMAL LOW (ref 6.5–8.1)

## 2020-03-20 LAB — CBC WITH DIFFERENTIAL/PLATELET
Abs Immature Granulocytes: 0 10*3/uL (ref 0.00–0.07)
Basophils Absolute: 0 10*3/uL (ref 0.0–0.1)
Basophils Relative: 0 %
Eosinophils Absolute: 0 10*3/uL (ref 0.0–0.5)
Eosinophils Relative: 0 %
HCT: 33.5 % — ABNORMAL LOW (ref 39.0–52.0)
Hemoglobin: 11.1 g/dL — ABNORMAL LOW (ref 13.0–17.0)
Lymphocytes Relative: 6 %
Lymphs Abs: 0.4 10*3/uL — ABNORMAL LOW (ref 0.7–4.0)
MCH: 29.1 pg (ref 26.0–34.0)
MCHC: 33.1 g/dL (ref 30.0–36.0)
MCV: 87.7 fL (ref 80.0–100.0)
Monocytes Absolute: 0.2 10*3/uL (ref 0.1–1.0)
Monocytes Relative: 3 %
Neutro Abs: 6.5 10*3/uL (ref 1.7–7.7)
Neutrophils Relative %: 91 %
Platelets: 308 10*3/uL (ref 150–400)
RBC: 3.82 MIL/uL — ABNORMAL LOW (ref 4.22–5.81)
RDW: 15.5 % (ref 11.5–15.5)
WBC: 7.1 10*3/uL (ref 4.0–10.5)
nRBC: 0 % (ref 0.0–0.2)
nRBC: 0 /100 WBC

## 2020-03-20 LAB — C-REACTIVE PROTEIN: CRP: 7.9 mg/dL — ABNORMAL HIGH (ref <1.0)

## 2020-03-20 LAB — PROTIME-INR
INR: 5.4 (ref 0.8–1.2)
Prothrombin Time: 47.6 seconds — ABNORMAL HIGH (ref 11.4–15.2)

## 2020-03-20 LAB — MAGNESIUM: Magnesium: 2.6 mg/dL — ABNORMAL HIGH (ref 1.7–2.4)

## 2020-03-20 LAB — BRAIN NATRIURETIC PEPTIDE: B Natriuretic Peptide: 108.2 pg/mL — ABNORMAL HIGH (ref 0.0–100.0)

## 2020-03-20 LAB — PROCALCITONIN: Procalcitonin: 0.14 ng/mL

## 2020-03-20 MED ORDER — CEPHALEXIN 500 MG PO CAPS
500.0000 mg | ORAL_CAPSULE | Freq: Three times a day (TID) | ORAL | Status: AC
  Administered 2020-03-20 – 2020-03-22 (×8): 500 mg via ORAL
  Filled 2020-03-20 (×8): qty 1

## 2020-03-20 MED ORDER — FUROSEMIDE 40 MG PO TABS
40.0000 mg | ORAL_TABLET | Freq: Once | ORAL | Status: AC
  Administered 2020-03-20: 40 mg via ORAL
  Filled 2020-03-20: qty 1

## 2020-03-20 MED ORDER — DOXYCYCLINE HYCLATE 100 MG PO TABS: 100.0000 mg | ORAL_TABLET | Freq: Two times a day (BID) | ORAL | Status: DC

## 2020-03-20 MED ORDER — VITAMIN K1 10 MG/ML IJ SOLN
0.5000 mg | Freq: Once | INTRAVENOUS | Status: AC
  Administered 2020-03-20: 0.5 mg via INTRAVENOUS
  Filled 2020-03-20: qty 0.05

## 2020-03-20 MED ORDER — METHYLPREDNISOLONE SODIUM SUCC 40 MG IJ SOLR
20.0000 mg | Freq: Every day | INTRAMUSCULAR | Status: DC
  Administered 2020-03-21 – 2020-03-23 (×3): 20 mg via INTRAVENOUS
  Filled 2020-03-20 (×3): qty 1

## 2020-03-20 NOTE — Progress Notes (Signed)
Physical Therapy Treatment Patient Details Name: Victor Cooper MRN: [id number] DOB: [birth year] Today's Date: 03/20/2020    History of Present Illness Pt is a 79 y.o. male who tested (+) COVID-19 on 03/05/20 with hospitalization at [facility] from 9/3-9/6 for COVID-19 PNA and sepsis (d/c home without O2), now readmitted 03/17/20 with diarrhea, weakness and decreased PO intake. Found to be hypoxic, AKI. PMH includes HTN, COPD, PE, bladder CA.   PT Comments    Pt progressing well with mobility, motivated to participate. Pt able to transfer and ambulate with RW, requiring min guard for balance due to instability. Pt agrees stability improved with RW and he will plan to use one at home. Pt required 3L O2 [place] to maintain SpO2 >/88% with activity. Continue to recommend HHPT services.    Follow Up Recommendations  Home health PT;Supervision/Assistance - 24 hour     Equipment Recommendations  Rolling walker with 5" wheels;3in1 (PT)    Recommendations for Other Services       Precautions / Restrictions Precautions Precautions: Fall Restrictions Weight Bearing Restrictions: No    Mobility  Bed Mobility               General bed mobility comments: Received sitting in recliner  Transfers Overall transfer level: Needs assistance Equipment used: Rolling walker (2 wheeled) Transfers: Sit to/from Stand Sit to Stand: Min guard         General transfer comment: Increased time and effort, min guard for balance  Ambulation/Gait Ambulation/Gait assistance: Min guard Gait Distance (Feet): 190 Feet Assistive device: Rolling walker (2 wheeled) Gait Pattern/deviations: Step-through pattern;Decreased stride length;Trunk flexed Gait velocity: Decreased   General Gait Details: Slow, mostly steady gait with RW and intermittent min guard for balance; SpO2 down to 81% on 2L O2 [place], increased to 3L maintaining 88%   Stairs             Wheelchair Mobility    Modified Rankin (Stroke Patients  Only)       Balance Overall balance assessment: Needs assistance Sitting-balance support: Feet supported Sitting balance-Leahy Scale: Fair     Standing balance support: Bilateral upper extremity supported;During functional activity;No upper extremity supported Standing balance-Leahy Scale: Fair Standing balance comment: Can static stand without UE support; stability improved with RW                            Cognition Arousal/Alertness: Awake/alert Behavior During Therapy: WFL for tasks assessed/performed Overall Cognitive Status: No family/caregiver present to determine baseline cognitive functioning Area of Impairment: Following commands;Problem solving;Attention;Awareness                   Current Attention Level: Selective   Following Commands: Follows multi-step commands inconsistently   Awareness: Emergent Problem Solving: Requires verbal cues General Comments: suspect this to be baseline cognition exacerbated by [facility]      Exercises      General Comments        Pertinent Vitals/Pain Pain Assessment: No/denies pain    Home Living                      Prior Function            PT Goals (current goals can now be found in the care plan section) Progress towards PT goals: Progressing toward goals    Frequency    Min 3X/week      PT Plan Current plan remains appropriate  Co-evaluation              AM-PAC PT "6 Clicks" Mobility   Outcome Measure  Help needed turning from your back to your side while in a flat bed without using bedrails?: None Help needed moving from lying on your back to sitting on the side of a flat bed without using bedrails?: None Help needed moving to and from a bed to a chair (including a wheelchair)?: A Little Help needed standing up from a chair using your arms (e.g., wheelchair or bedside chair)?: A Little Help needed to walk in hospital room?: A Little Help needed climbing 3-5 steps with  a railing? : A Little 6 Click Score: 20    End of Session Equipment Utilized During Treatment: Oxygen Activity Tolerance: Patient tolerated treatment well Patient left: in chair;with call bell/phone within reach;with chair alarm set Nurse Communication: Mobility status PT Visit Diagnosis: Other abnormalities of gait and mobility (R26.89)     Time: 9166-0600 PT Time Calculation (min) (ACUTE ONLY): 21 min  Charges:  $Therapeutic Exercise: 8-22 mins                     Mabeline Caras, PT, DPT Acute Rehabilitation Services  Pager [phone] Office [place] 03/20/2020, 10:43 AM

## 2020-03-20 NOTE — TOC Progression Note (Signed)
Transition of Care [facility]) - Progression Note    Patient Details  Name: Victor Cooper MRN: [id number] Date of Birth: 1941-03-06  Transition of Care [facility]) CM/SW [place], RN Phone Number: 03/20/2020, 2:01 PM  Clinical Narrative:     Patient set up with Alvis Lemmings for PT and RN. Plan on DC tomorrow Will follow up with oxygen requirements if needed.   Expected Discharge Plan: [place] Barriers to Discharge: Continued Medical Work up  Expected Discharge Plan and Services Expected Discharge Plan: [place]   Discharge Planning Services: CM Consult Post Acute Care Choice: [place] arrangements for the past 2 months: Single Family Home                 DME Arranged: Gilford Rile, 3-N-1 DME Agency: [employer] Date DME Agency Contacted: 03/19/20 Time DME Agency Contacted: [phone] Representative spoke with at DME Agency: Freda Munro HH Arranged: RN, PT [facility] Agency: [place] Date [place]: 03/20/20 Time [place]: 1252 Representative spoke with at [place]: [place] ([place]) Interventions    Readmission Risk Interventions No flowsheet data found.

## 2020-03-20 NOTE — Progress Notes (Signed)
Ok to add stop date of 5d to total abx (keflex) per Dr Candiss Norse.  Onnie Boer, PharmD, BCIDP, AAHIVP, CPP Infectious Disease Pharmacist 03/20/2020 1:03 PM

## 2020-03-20 NOTE — Progress Notes (Signed)
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 male admitted on 03/17/2020 with pneumonia.  Pharmacy has been consulted for vancomycin and cefepime dosing. Pt is with Tmax 99.6 and WBC is WNL at 10.1. Scr is slightly above baseline at 1.59 and lactic acid is <2. Noted recent admission for COVID.   Pt is now on D4 of abx for PNA/UIT. Wbc wnl. PCT trended down to 0.14  Coumadin was ordered to be continued for his hx of DVT/PE. INR remains elevated despite no coumadin. MD order vitamin k 0.5mg  IV x1 today INR goal: 2-3 PTA coumadin>5mg  PO qday except for 7.5mg  MWF  Plan: No coumadin today Daily INR  Weight: 95.1 kg (209 lb 10.5 oz)  Temp (24hrs), Avg:97.8 F (36.6 C), Min:97.5 F (36.4 C), Max:98.4 F (36.9 C)  Recent Labs  Lab 03/17/20 1151 03/18/20 0602 03/19/20 0543 03/20/20 0500  WBC 10.1  --  8.4 7.1  CREATININE 1.59* 1.03 1.14 1.25*  LATICACIDVEN 1.5  --   --   --     Estimated Creatinine Clearance: 56.4 mL/min (A) (by C-G formula based on SCr of 1.25 mg/dL (H)).    No Known Allergies  Antimicrobials this admission: Vanc 9/14>>9/15 Cefepime 9/14>>9/15 Remdesivir 9/4>9/8 Ceftriaxone 9/15>>9/16 Azith 9/15>>9/16 Keflex 9/17>> Dose adjustments this admission: N/A  Microbiology results: 9/14 blood>>ngtd 9/14 urine>>negF  Onnie Boer, PharmD, BCIDP, AAHIVP, CPP Infectious Disease Pharmacist 03/20/2020 12:49 PM

## 2020-03-20 NOTE — Progress Notes (Signed)
CRITICAL VALUE ALERT  Critical Value:  INR 5.0  Date & Time Notied: 03-20-2020 @ [phone]  Provider Notified: Dr. Cyd Silence  Orders Received/Actions taken: none.

## 2020-03-20 NOTE — Progress Notes (Signed)
PROGRESS NOTE                                                                                                                                                                                                             Patient Demographics:    Victor Cooper, is a 79 y.o. male, DOB - [birth year], JME:[id number]  Outpatient Primary MD for the patient is Merwyn Katos    LOS - 2  Admit date - 03/17/2020    Chief Complaint  Patient presents with  . Fatigue    Covid +       Brief Narrative - Victor Cooper is a 79 y.o. male with medical history significant of COPD; PE, on Coumadin; bladder cancer, on active chemotherapy; HTN; and HLD who tested + for COVID-19 on 9/2, who presented to [facility] with lethargy and SOB.  He was previously hospitalized for COVID-19 PNA and sepsis from 9/3-6 and treated with Remdesivir (completed outpatient infusion) and steroids; he did not require oxygen at the time of discharge, comes back with dehydration, weakness and fevers.   Subjective:   Patient in bed, appears comfortable, denies any headache, no fever, no chest pain or pressure, no shortness of breath , no abdominal pain. No focal weakness.   Assessment  & Plan :     1. Recent Acute Covid 19 Viral Pneumonitis  - this was adequately treated on recent admission and seems to have resolved.  Encouraged the patient to sit up in chair in the daytime use I-S and flutter valve for pulmonary toiletry and then prone in bed when at night.  Will advance activity and titrate down oxygen as possible.    SpO2: 94 % O2 Flow Rate (L/min): 2 L/min  Recent Labs  Lab 03/17/20 1151 03/18/20 0602 03/18/20 1224 03/19/20 0543 03/20/20 0500  WBC 10.1  --   --  8.4 7.1  PLT 227  --   --  259 308  CRP  --   --  25.2* 14.6* 7.9*  BNP  --   --   --  25.2 108.2*  DDIMER  --   --  0.66* 0.40  --   PROCALCITON  --   --  0.59 0.28 0.14  AST 54*  --    --  52* 47*  ALT 38  --   --  45* 53*  ALKPHOS 37*  --   --  37* 36*  BILITOT 0.9  --   --  0.7 0.6  ALBUMIN 3.0*  --   --  2.3* 2.3*  INR 4.3* 5.0*  --  5.0* 5.4*  LATICACIDVEN 1.5  --   --   --   --       2.  Rigors at home along with dehydration and generalized weakness.  Likely due to UTI, continue antibiotics for now, procalcitonin was mildly elevated and CRP was high as well, agree with antibiotics and for now + steroids due to recent history of Covid which can at times reinflamed, chances of which are low. Follow final cultures.  3.  History of bladder cancer.  Under the care of Dr. Amalia Hailey in [facility], undergoing bladder chemotherapy, outpatient monitoring no acute issues.  Does put him at high risk for UTIs.  4.  Dehydration with hypernatremia and AKI.  Has been adequately hydrated with IV fluids.   5.  COPD.  No acute issues.  6.  Dyslipidemia.  On statins.  7.  History of PE.  On Coumadin.  Pharmacy monitoring INR.  INR supratherapeutic at 5.4, gentle 0.5 of IV vitamin K on 03/20/2020 and monitor.  8.  Obesity.  BMI greater than 32.  Follow with PCP for weight loss.     Condition -  Guarded  Family Communication  :  Daughter Kiyaan Haq, [phone] - 03/19/20, 03/20/20  Code Status :  DNR  Consults  :  None  Procedures  :    PUD Prophylaxis : PPI  Disposition Plan  :    Status is: Inpatient  Remains inpatient appropriate because:IV treatments appropriate due to intensity of illness or inability to take PO   Dispo: The patient is from: Home              Anticipated d/c is to: Home              Anticipated d/c date is: > 3 days              Patient currently is not medically stable to d/c.   DVT Prophylaxis  : Coumadin  Lab Results  Component Value Date   INR 5.4 (HH) 03/20/2020   INR 5.0 (HH) 03/19/2020   INR 5.0 (HH) 03/18/2020     Lab Results  Component Value Date   PLT 308 03/20/2020    Diet :  Diet Order            DIET SOFT Room  service appropriate? Yes; Fluid consistency: Thin  Diet effective now                  Inpatient Medications  Scheduled Meds: . vitamin C  500 mg Oral Daily  . aspirin EC  81 mg Oral Daily  . doxycycline  100 mg Oral Q12H  . furosemide  40 mg Oral Once  . [START ON 03/21/2020] methylPREDNISolone (SOLU-MEDROL) injection  20 mg Intravenous Daily  . pantoprazole  40 mg Oral Daily  . rosuvastatin  20 mg Oral Daily  . Warfarin - Pharmacist Dosing Inpatient   Does not apply q1600  . zinc sulfate  220 mg Oral Daily   Continuous Infusions:  PRN Meds:.acetaminophen, albuterol, chlorpheniramine-HYDROcodone, guaiFENesin-dextromethorphan, [DISCONTINUED] ondansetron **OR** ondansetron (ZOFRAN) IV  Antibiotics  :    Anti-infectives (From admission, onward)   Start     Dose/Rate Route Frequency Ordered Stop   03/20/20 1115  doxycycline (VIBRA-TABS) tablet 100 mg        100 mg Oral Every 12 hours 03/20/20 1105     03/18/20 2000  ceFEPIme (MAXIPIME) 2 g in sodium chloride 0.9 % 100 mL IVPB  Status:  Discontinued        2 g 200 mL/hr over 30 Minutes Intravenous Every 8 hours 03/18/20 1238 03/18/20 1407   03/18/20 1500  cefTRIAXone (ROCEPHIN) 2 g in sodium chloride 0.9 % 100 mL IVPB  Status:  Discontinued        2 g 200 mL/hr over 30 Minutes Intravenous Every 24 hours 03/18/20 1407 03/20/20 1105   03/18/20 1500  azithromycin (ZITHROMAX) 500 mg in sodium chloride 0.9 % 250 mL IVPB  Status:  Discontinued        500 mg 250 mL/hr over 60 Minutes Intravenous Every 24 hours 03/18/20 1407 03/20/20 1105   03/18/20 1400  vancomycin (VANCOCIN) IVPB 1000 mg/200 mL premix  Status:  Discontinued        1,000 mg 200 mL/hr over 60 Minutes Intravenous Every 12 hours 03/18/20 1254 03/18/20 1407   03/18/20 0245  vancomycin (VANCOCIN) 1-5 GM/200ML-% IVPB       Note to Pharmacy: Mickie Hillier   : cabinet override      03/18/20 0245 03/18/20 0412   03/18/20 0200  vancomycin (VANCOREADY) IVPB 750 mg/150 mL   Status:  Discontinued        750 mg 150 mL/hr over 60 Minutes Intravenous Every 12 hours 03/17/20 1417 03/18/20 1254   03/18/20 0000  ceFEPIme (MAXIPIME) 2 g in sodium chloride 0.9 % 100 mL IVPB  Status:  Discontinued        2 g 200 mL/hr over 30 Minutes Intravenous Every 12 hours 03/17/20 1414 03/18/20 1238   03/17/20 1330  vancomycin (VANCOCIN) IVPB 1000 mg/200 mL premix        1,000 mg 200 mL/hr over 60 Minutes Intravenous  Once 03/17/20 1212 03/17/20 1509   03/17/20 1200  vancomycin (VANCOCIN) IVPB 1000 mg/200 mL premix        1,000 mg 200 mL/hr over 60 Minutes Intravenous  Once 03/17/20 1148 03/17/20 1409   03/17/20 1200  ceFEPIme (MAXIPIME) 2 g in sodium chloride 0.9 % 100 mL IVPB        2 g 200 mL/hr over 30 Minutes Intravenous  Once 03/17/20 1148 03/17/20 1240       Time Spent in minutes  30   Lala Lund M.D on 03/20/2020 at 11:09 AM  To page go to [email or web address] - password [facility]  [facility] -  Office  [phone]   See all Orders from today for further details    Objective:   Vitals:   03/19/20 2045 03/19/20 2100 03/19/20 2355 03/20/20 0445  BP:  127/77  138/78  Pulse: 62 60 (!) 57 60  Resp: (!) 22 (!) 24 19 18   Temp:  (!) 97.5 F (36.4 C)  98.4 F (36.9 C)  TempSrc:  Oral  Oral  SpO2: 90% 95% 92% 94%  Weight:    95.1 kg    Wt Readings from Last 3 Encounters:  03/20/20 95.1 kg  03/09/20 99 kg  10/10/17 103.4 kg     Intake/Output Summary (Last 24 hours) at 03/20/2020 1109 Last data filed at 03/20/2020 0945 Gross per 24 hour  Intake 550 ml  Output 1450 ml  Net -900 ml     Physical Exam  Awake Alert, No new F.N deficits, Normal affect [place].AT,PERRAL Supple  Neck,No JVD, No cervical lymphadenopathy appriciated.  Symmetrical Chest wall movement, Good air movement bilaterally, fine rales RRR,No Gallops, Rubs or new Murmurs, No Parasternal Heave +ve B.Sounds, Abd Soft, No tenderness, No organomegaly appriciated, No rebound - guarding or  rigidity. No Cyanosis, Clubbing or edema, No new Rash or bruise     Data Review:    CBC Recent Labs  Lab 03/17/20 1151 03/19/20 0543 03/20/20 0500  WBC 10.1 8.4 7.1  HGB 13.7 11.4* 11.1*  HCT 39.6 33.5* 33.5*  PLT 227 259 308  MCV 86.1 86.6 87.7  MCH 29.8 29.5 29.1  MCHC 34.6 34.0 33.1  RDW 15.2 15.5 15.5  LYMPHSABS 0.6* 0.6* 0.4*  MONOABS 0.3 0.3 0.2  EOSABS 0.0 0.0 0.0  BASOSABS 0.0 0.0 0.0    Recent Labs  Lab 03/17/20 1151 03/18/20 0602 03/18/20 1224 03/19/20 0543 03/20/20 0500  NA 127* 128*  --  132* 135  K 4.3 4.4  --  4.5 4.7  CL 96* 98  --  102 102  CO2 19* 20*  --  21* 22  GLUCOSE 110* 182*  --  195* 185*  BUN 27* 27*  --  26* 31*  CREATININE 1.59* 1.03  --  1.14 1.25*  CALCIUM 8.2* 8.2*  --  8.6* 8.5*  AST 54*  --   --  52* 47*  ALT 38  --   --  45* 53*  ALKPHOS 37*  --   --  37* 36*  BILITOT 0.9  --   --  0.7 0.6  ALBUMIN 3.0*  --   --  2.3* 2.3*  MG  --   --   --  2.3 2.6*  CRP  --   --  25.2* 14.6* 7.9*  DDIMER  --   --  0.66* 0.40  --   PROCALCITON  --   --  0.59 0.28 0.14  LATICACIDVEN 1.5  --   --   --   --   INR 4.3* 5.0*  --  5.0* 5.4*  BNP  --   --   --  25.2 108.2*    ------------------------------------------------------------------------------------------------------------------ No results for input(s): CHOL, HDL, LDLCALC, TRIG, CHOLHDL, LDLDIRECT in the last 72 hours.  No results found for: HGBA1C ------------------------------------------------------------------------------------------------------------------ No results for input(s): TSH, T4TOTAL, T3FREE, THYROIDAB in the last 72 hours.  Invalid input(s): FREET3  Cardiac Enzymes No results for input(s): CKMB, TROPONINI, MYOGLOBIN in the last 168 hours.  Invalid input(s): CK ------------------------------------------------------------------------------------------------------------------    Component Value Date/Time   BNP 108.2 (H) 03/20/2020 0500    Micro  Results Recent Results (from the past 240 hour(s))  Blood Culture (routine x 2)     Status: None (Preliminary result)   Collection Time: 03/17/20 11:48 AM   Specimen: BLOOD LEFT WRIST  Result Value Ref Range Status   Specimen Description   Final    BLOOD LEFT WRIST Performed at [facility], [address] [place], [place] [address]    Special Requests   Final    BOTTLES DRAWN AEROBIC AND ANAEROBIC Blood Culture adequate volume Performed at [facility], [address] [place], [place] [address]    Culture   Final    NO GROWTH 3 DAYS Performed at [facility], [place] [address] [place], [place] [address]    Report Status PENDING  Incomplete  Blood Culture (routine x 2)     Status: None (Preliminary result)   Collection Time: 03/17/20 12:25 PM  Specimen: BLOOD RIGHT FOREARM  Result Value Ref Range Status   Specimen Description   Final    BLOOD RIGHT FOREARM Performed at [facility], [address] [place], [place] [address]    Special Requests   Final    BOTTLES DRAWN AEROBIC AND ANAEROBIC Blood Culture results may not be optimal due to an inadequate volume of blood received in culture bottles Performed at [facility], [address] [place], [place] [address]    Culture   Final    NO GROWTH 3 DAYS Performed at [facility], [place] [address] [place], [place] [address]    Report Status PENDING  Incomplete  Urine culture     Status: None   Collection Time: 03/17/20  4:12 PM   Specimen: In/Out Cath Urine  Result Value Ref Range Status   Specimen Description   Final    IN/OUT CATH URINE Performed at [facility], [address] [place], [place] [address]    Special Requests   Final    NONE Performed at [facility], [address] [place], [place] [address]    Culture   Final    NO GROWTH Performed at [facility], [place] [address] [place],  [address]    Report Status  03/18/2020 FINAL  Final    Radiology Reports DG Chest Port 1 View  Result Date: 03/17/2020 CLINICAL DATA:  Question sepsis.  COVID-19 positive. EXAM: PORTABLE CHEST 1 VIEW COMPARISON:  03/06/2020 FINDINGS: Mild patchy perihilar airspace disease has developed since the prior study. Small right pleural effusion also new. No vascular congestion. Heart size within normal limits. IMPRESSION: Mild perihilar airspace disease bilaterally and small right effusion. Findings most likely due to COVID pneumonia. Electronically Signed   By: Franchot Gallo M.D.   On: 03/17/2020 12:45   DG Chest Port 1 View  Result Date: 03/06/2020 CLINICAL DATA:  Questionable sepsis - evaluate for abnormality Tremors since chemo this morning. EXAM: PORTABLE CHEST 1 VIEW COMPARISON:  09/03/2018 FINDINGS: The cardiomediastinal contours are normal. Subsegmental opacities at both lung bases. Pulmonary vasculature is normal. No consolidation, pleural effusion, or pneumothorax. No acute osseous abnormalities are seen. No bands degenerative change in the shoulders. IMPRESSION: Subsegmental bibasilar atelectasis. Electronically Signed   By: Keith Rake M.D.   On: 03/06/2020 18:18

## 2020-03-21 ENCOUNTER — Inpatient Hospital Stay (HOSPITAL_COMMUNITY): Payer: Medicare HMO

## 2020-03-21 LAB — CBC WITH DIFFERENTIAL/PLATELET
Abs Immature Granulocytes: 0 10*3/uL (ref 0.00–0.07)
Basophils Absolute: 0 10*3/uL (ref 0.0–0.1)
Basophils Relative: 0 %
Eosinophils Absolute: 0 10*3/uL (ref 0.0–0.5)
Eosinophils Relative: 0 %
HCT: 33.4 % — ABNORMAL LOW (ref 39.0–52.0)
Hemoglobin: 11.1 g/dL — ABNORMAL LOW (ref 13.0–17.0)
Lymphocytes Relative: 6 %
Lymphs Abs: 0.5 10*3/uL — ABNORMAL LOW (ref 0.7–4.0)
MCH: 29.3 pg (ref 26.0–34.0)
MCHC: 33.2 g/dL (ref 30.0–36.0)
MCV: 88.1 fL (ref 80.0–100.0)
Monocytes Absolute: 0.4 10*3/uL (ref 0.1–1.0)
Monocytes Relative: 5 %
Neutro Abs: 7.5 10*3/uL (ref 1.7–7.7)
Neutrophils Relative %: 89 %
Platelets: 329 10*3/uL (ref 150–400)
RBC: 3.79 MIL/uL — ABNORMAL LOW (ref 4.22–5.81)
RDW: 15.5 % (ref 11.5–15.5)
WBC: 8.4 10*3/uL (ref 4.0–10.5)
nRBC: 0 % (ref 0.0–0.2)
nRBC: 0 /100 WBC

## 2020-03-21 LAB — COMPREHENSIVE METABOLIC PANEL
ALT: 56 U/L — ABNORMAL HIGH (ref 0–44)
AST: 43 U/L — ABNORMAL HIGH (ref 15–41)
Albumin: 2.4 g/dL — ABNORMAL LOW (ref 3.5–5.0)
Alkaline Phosphatase: 36 U/L — ABNORMAL LOW (ref 38–126)
Anion gap: 9 (ref 5–15)
BUN: 32 mg/dL — ABNORMAL HIGH (ref 8–23)
CO2: 22 mmol/L (ref 22–32)
Calcium: 8.6 mg/dL — ABNORMAL LOW (ref 8.9–10.3)
Chloride: 105 mmol/L (ref 98–111)
Creatinine, Ser: 1.19 mg/dL (ref 0.61–1.24)
GFR calc Af Amer: >60 mL/min (ref >60)
GFR calc non Af Amer: 59 mL/min — ABNORMAL LOW (ref >60)
Glucose, Bld: 193 mg/dL — ABNORMAL HIGH (ref 70–99)
Potassium: 4.1 mmol/L (ref 3.5–5.1)
Sodium: 136 mmol/L (ref 135–145)
Total Bilirubin: 0.6 mg/dL (ref 0.3–1.2)
Total Protein: 6.4 g/dL — ABNORMAL LOW (ref 6.5–8.1)

## 2020-03-21 LAB — MAGNESIUM: Magnesium: 2.6 mg/dL — ABNORMAL HIGH (ref 1.7–2.4)

## 2020-03-21 LAB — PROTIME-INR
INR: 4.5 (ref 0.8–1.2)
Prothrombin Time: 41.5 seconds — ABNORMAL HIGH (ref 11.4–15.2)

## 2020-03-21 LAB — BRAIN NATRIURETIC PEPTIDE: B Natriuretic Peptide: 81.1 pg/mL (ref 0.0–100.0)

## 2020-03-21 LAB — PROCALCITONIN: Procalcitonin: <0.1 ng/mL

## 2020-03-21 LAB — C-REACTIVE PROTEIN: CRP: 5.3 mg/dL — ABNORMAL HIGH (ref <1.0)

## 2020-03-21 NOTE — Progress Notes (Signed)
CRITICAL VALUE ALERT  Critical Value:  INR 4.5  Date & Time Notied:  03/21/2020 at 0333  Provider Notified: MD V. Rathore  Orders Received/Actions taken: no new order receive.

## 2020-03-21 NOTE — Progress Notes (Signed)
ANTICOAGULATION CONSULT NOTE - Follow Up Consult  Pharmacy Consult for warfarin Indication: history of pulmonary embolus and DVT  No Known Allergies  Patient Measurements: Weight: 95.1 kg (209 lb 10.5 oz)   Vital Signs: Temp: 97.7 F (36.5 C) (09/18 0504) Temp Source: Oral (09/18 0504) BP: 144/75 (09/18 0504) Pulse Rate: 61 (09/18 0504)  Labs: Recent Labs    03/19/20 0543 03/19/20 0543 03/20/20 0500 03/21/20 0159  HGB 11.4*   < > 11.1* 11.1*  HCT 33.5*  --  33.5* 33.4*  PLT 259  --  308 329  LABPROT 45.3*  --  47.6* 41.5*  INR 5.0*  --  5.4* 4.5*  CREATININE 1.14  --  1.25* 1.19   < > = values in this interval not displayed.    Estimated Creatinine Clearance: 59.2 mL/min (by C-G formula based on SCr of 1.19 mg/dL).  Assessment: 79 y/o male admitted on 9/14 with pneumonia with recent admission for [place]. Warfarin was ordered to be continued for history of DVT/PE. INR remains elevated, but is downtrending, despite 0.5mg  IV of vitamin K  given yesterday and warfarin being held. INR (9/18) is 4.5.  Hgb stable 11.1, PLT 329 today  Goal of Therapy:  INR 2-3 Monitor platelets by anticoagulation protocol: Yes   Plan:  Continue to hold warfarin Monitor CBC, s/sx bleeding, and daily INR  Victor Cooper  PGY1 Pharmacy Resident  03/21/2020,8:43 AM

## 2020-03-21 NOTE — Progress Notes (Addendum)
PROGRESS NOTE                                                                                                                                                                                                             Patient Demographics:    Victor Cooper, is a 79 y.o. male, DOB - Feb 15, 1941, QGB:[id number]  Outpatient Primary MD for the patient is Merwyn Katos    LOS - 3  Admit date - 03/17/2020    Chief Complaint  Patient presents with  . Fatigue    Covid +       Brief Narrative - Victor Cooper is a 79 y.o. male with medical history significant of COPD; PE, on Coumadin; bladder cancer, on active chemotherapy; HTN; and HLD who tested + for COVID-19 on 9/2, who presented to [facility] with lethargy and SOB.  He was previously hospitalized for COVID-19 PNA and sepsis from 9/3-6 and treated with Remdesivir (completed outpatient infusion) and steroids; he did not require oxygen at the time of discharge, comes back with dehydration, weakness and fevers.   Subjective:   Patient in bed, appears comfortable, denies any headache, no fever, no chest pain or pressure, no shortness of breath , no abdominal pain. No focal weakness.    Assessment  & Plan :     1. Recent Acute Covid 19 Viral Pneumonitis  - this was adequately treated on recent admission and seems to have resolved.  Encouraged the patient to sit up in chair in the daytime use I-S and flutter valve for pulmonary toiletry and then prone in bed when at night.  Will advance activity and titrate down oxygen as possible.    SpO2: 91 % O2 Flow Rate (L/min): 2 L/min  Recent Labs  Lab 03/17/20 1151 03/18/20 0602 03/18/20 1224 03/19/20 0543 03/20/20 0500 03/21/20 0159  WBC 10.1  --   --  8.4 7.1 8.4  PLT 227  --   --  259 308 329  CRP  --   --  25.2* 14.6* 7.9* 5.3*  BNP  --   --   --  25.2 108.2* 81.1  DDIMER  --   --  0.66* 0.40  --   --   PROCALCITON   --   --  0.59 0.28 0.14 <0.10  AST 54*  --   --  52* 47* 43*  ALT 38  --   --  45* 53* 56*  ALKPHOS 37*  --   --  37* 36* 36*  BILITOT 0.9  --   --  0.7 0.6 0.6  ALBUMIN 3.0*  --   --  2.3* 2.3* 2.4*  INR 4.3* 5.0*  --  5.0* 5.4* 4.5*  LATICACIDVEN 1.5  --   --   --   --   --       2.  Rigors at home along with dehydration and generalized weakness.  Likely due to UTI, continue present antibiotics for now, procalcitonin was mildly elevated and CRP was high as well, taper down steroids. Follow final cultures.  3.  History of bladder cancer.  Under the care of Dr. Amalia Hailey in [facility], undergoing bladder chemotherapy, outpatient monitoring no acute issues.  Does put him at high risk for UTIs.  4.  Dehydration with hypernatremia and AKI.  Has been adequately hydrated with IV fluids.   5.  COPD.  No acute issues.  6.  Dyslipidemia.  On statins.  7.  History of PE.  On Coumadin.  Pharmacy monitoring INR.  INR supratherapeutic at 5.4, gentle 0.5 of IV vitamin K on 03/20/2020 and monitor.  8.  Obesity.  BMI greater than 32.  Follow with PCP for weight loss.  9.  Weakness and deconditioning.  Doing well with PT home PT ordered.  He does not want to go to SNF.  If stable likely discharge in the next 1 to 2 days.   Condition -  Guarded  Family Communication  :  Daughter Shamond Skelton, [phone] - 03/19/20, 03/20/20, called again on 03/21/2020 at 9:20 AM.  No response.  Code Status :  DNR  Consults  :  None  Procedures  :    PUD Prophylaxis : PPI  Disposition Plan  :    Status is: Inpatient  Remains inpatient appropriate because:IV treatments appropriate due to intensity of illness or inability to take PO   Dispo: The patient is from: Home              Anticipated d/c is to: Home              Anticipated d/c date is: > 3 days              Patient currently is not medically stable to d/c.   DVT Prophylaxis  : Coumadin  Lab Results  Component Value Date   INR 4.5 (HH)  03/21/2020   INR 5.4 (HH) 03/20/2020   INR 5.0 (HH) 03/19/2020     Lab Results  Component Value Date   PLT 329 03/21/2020    Diet :  Diet Order            DIET SOFT Room service appropriate? Yes; Fluid consistency: Thin  Diet effective now                  Inpatient Medications  Scheduled Meds: . vitamin C  500 mg Oral Daily  . aspirin EC  81 mg Oral Daily  . cephALEXin  500 mg Oral Q8H  . methylPREDNISolone (SOLU-MEDROL) injection  20 mg Intravenous Daily  . pantoprazole  40 mg Oral Daily  . rosuvastatin  20 mg Oral Daily  . Warfarin - Pharmacist Dosing Inpatient   Does not apply q1600  . zinc sulfate  220 mg Oral Daily   Continuous Infusions:  PRN Meds:.acetaminophen, albuterol, chlorpheniramine-HYDROcodone, guaiFENesin-dextromethorphan, [DISCONTINUED] ondansetron **  OR** ondansetron (ZOFRAN) IV  Antibiotics  :    Anti-infectives (From admission, onward)   Start     Dose/Rate Route Frequency Ordered Stop   03/20/20 1400  cephALEXin (KEFLEX) capsule 500 mg       Note to Pharmacy: Can adjust for UTI   500 mg Oral Every 8 hours 03/20/20 1112 03/23/20 0559   03/20/20 1200  doxycycline (VIBRA-TABS) tablet 100 mg  Status:  Discontinued        100 mg Oral Every 12 hours 03/20/20 1105 03/20/20 1112   03/18/20 2000  ceFEPIme (MAXIPIME) 2 g in sodium chloride 0.9 % 100 mL IVPB  Status:  Discontinued        2 g 200 mL/hr over 30 Minutes Intravenous Every 8 hours 03/18/20 1238 03/18/20 1407   03/18/20 1500  cefTRIAXone (ROCEPHIN) 2 g in sodium chloride 0.9 % 100 mL IVPB  Status:  Discontinued        2 g 200 mL/hr over 30 Minutes Intravenous Every 24 hours 03/18/20 1407 03/20/20 1105   03/18/20 1500  azithromycin (ZITHROMAX) 500 mg in sodium chloride 0.9 % 250 mL IVPB  Status:  Discontinued        500 mg 250 mL/hr over 60 Minutes Intravenous Every 24 hours 03/18/20 1407 03/20/20 1105   03/18/20 1400  vancomycin (VANCOCIN) IVPB 1000 mg/200 mL premix  Status:  Discontinued         1,000 mg 200 mL/hr over 60 Minutes Intravenous Every 12 hours 03/18/20 1254 03/18/20 1407   03/18/20 0245  vancomycin (VANCOCIN) 1-5 GM/200ML-% IVPB       Note to Pharmacy: Mickie Hillier   : cabinet override      03/18/20 0245 03/18/20 0412   03/18/20 0200  vancomycin (VANCOREADY) IVPB 750 mg/150 mL  Status:  Discontinued        750 mg 150 mL/hr over 60 Minutes Intravenous Every 12 hours 03/17/20 1417 03/18/20 1254   03/18/20 0000  ceFEPIme (MAXIPIME) 2 g in sodium chloride 0.9 % 100 mL IVPB  Status:  Discontinued        2 g 200 mL/hr over 30 Minutes Intravenous Every 12 hours 03/17/20 1414 03/18/20 1238   03/17/20 1330  vancomycin (VANCOCIN) IVPB 1000 mg/200 mL premix        1,000 mg 200 mL/hr over 60 Minutes Intravenous  Once 03/17/20 1212 03/17/20 1509   03/17/20 1200  vancomycin (VANCOCIN) IVPB 1000 mg/200 mL premix        1,000 mg 200 mL/hr over 60 Minutes Intravenous  Once 03/17/20 1148 03/17/20 1409   03/17/20 1200  ceFEPIme (MAXIPIME) 2 g in sodium chloride 0.9 % 100 mL IVPB        2 g 200 mL/hr over 30 Minutes Intravenous  Once 03/17/20 1148 03/17/20 1240       Time Spent in minutes  30   Lala Lund M.D on 03/21/2020 at 9:17 AM  To page go to [email or web address] - password [facility]  [facility] -  Office  [phone]   See all Orders from today for further details    Objective:   Vitals:   03/20/20 0445 03/20/20 1424 03/20/20 2114 03/21/20 0504  BP: 138/78 (!) 142/70 (!) 157/87 (!) 144/75  Pulse: 60 69 71 61  Resp: 18 20 20 20   Temp: 98.4 F (36.9 C) 98.5 F (36.9 C) (!) 97.4 F (36.3 C) 97.7 F (36.5 C)  TempSrc: Oral Oral Oral Oral  SpO2: 94% 95% 95% 91%  Weight:  95.1 kg       Wt Readings from Last 3 Encounters:  03/20/20 95.1 kg  03/09/20 99 kg  10/10/17 103.4 kg     Intake/Output Summary (Last 24 hours) at 03/21/2020 0917 Last data filed at 03/21/2020 0538 Gross per 24 hour  Intake 680 ml  Output 1350 ml  Net -670 ml     Physical  Exam  Awake Alert, No new F.N deficits, Normal affect [place].AT,PERRAL Supple Neck,No JVD, No cervical lymphadenopathy appriciated.  Symmetrical Chest wall movement, Good air movement bilaterally, CTAB RRR,No Gallops, Rubs or new Murmurs, No Parasternal Heave +ve B.Sounds, Abd Soft, No tenderness, No organomegaly appriciated, No rebound - guarding or rigidity. No Cyanosis, Clubbing or edema, No new Rash or bruise     Data Review:    CBC Recent Labs  Lab 03/17/20 1151 03/19/20 0543 03/20/20 0500 03/21/20 0159  WBC 10.1 8.4 7.1 8.4  HGB 13.7 11.4* 11.1* 11.1*  HCT 39.6 33.5* 33.5* 33.4*  PLT 227 259 308 329  MCV 86.1 86.6 87.7 88.1  MCH 29.8 29.5 29.1 29.3  MCHC 34.6 34.0 33.1 33.2  RDW 15.2 15.5 15.5 15.5  LYMPHSABS 0.6* 0.6* 0.4* 0.5*  MONOABS 0.3 0.3 0.2 0.4  EOSABS 0.0 0.0 0.0 0.0  BASOSABS 0.0 0.0 0.0 0.0    Recent Labs  Lab 03/17/20 1151 03/18/20 0602 03/18/20 1224 03/19/20 0543 03/20/20 0500 03/21/20 0159  NA 127* 128*  --  132* 135 136  K 4.3 4.4  --  4.5 4.7 4.1  CL 96* 98  --  102 102 105  CO2 19* 20*  --  21* 22 22  GLUCOSE 110* 182*  --  195* 185* 193*  BUN 27* 27*  --  26* 31* 32*  CREATININE 1.59* 1.03  --  1.14 1.25* 1.19  CALCIUM 8.2* 8.2*  --  8.6* 8.5* 8.6*  AST 54*  --   --  52* 47* 43*  ALT 38  --   --  45* 53* 56*  ALKPHOS 37*  --   --  37* 36* 36*  BILITOT 0.9  --   --  0.7 0.6 0.6  ALBUMIN 3.0*  --   --  2.3* 2.3* 2.4*  MG  --   --   --  2.3 2.6* 2.6*  CRP  --   --  25.2* 14.6* 7.9* 5.3*  DDIMER  --   --  0.66* 0.40  --   --   PROCALCITON  --   --  0.59 0.28 0.14 <0.10  LATICACIDVEN 1.5  --   --   --   --   --   INR 4.3* 5.0*  --  5.0* 5.4* 4.5*  BNP  --   --   --  25.2 108.2* 81.1    ------------------------------------------------------------------------------------------------------------------ No results for input(s): CHOL, HDL, LDLCALC, TRIG, CHOLHDL, LDLDIRECT in the last 72 hours.  No results found for:  HGBA1C ------------------------------------------------------------------------------------------------------------------ No results for input(s): TSH, T4TOTAL, T3FREE, THYROIDAB in the last 72 hours.  Invalid input(s): FREET3  Cardiac Enzymes No results for input(s): CKMB, TROPONINI, MYOGLOBIN in the last 168 hours.  Invalid input(s): CK ------------------------------------------------------------------------------------------------------------------    Component Value Date/Time   BNP 81.1 03/21/2020 0159    Micro Results Recent Results (from the past 240 hour(s))  Blood Culture (routine x 2)     Status: None (Preliminary result)   Collection Time: 03/17/20 11:48 AM   Specimen: BLOOD LEFT WRIST  Result Value Ref Range Status  Specimen Description   Final    BLOOD LEFT WRIST Performed at [facility], [address] [place], [place] [address]    Special Requests   Final    BOTTLES DRAWN AEROBIC AND ANAEROBIC Blood Culture adequate volume Performed at [facility], [address] [place], [place] [address]    Culture   Final    NO GROWTH 3 DAYS Performed at [facility], [place] [address], [place], [place] [address]    Report Status PENDING  Incomplete  Blood Culture (routine x 2)     Status: None (Preliminary result)   Collection Time: 03/17/20 12:25 PM   Specimen: BLOOD RIGHT FOREARM  Result Value Ref Range Status   Specimen Description   Final    BLOOD RIGHT FOREARM Performed at [facility], [address] [place], [place] [address]    Special Requests   Final    BOTTLES DRAWN AEROBIC AND ANAEROBIC Blood Culture results may not be optimal due to an inadequate volume of blood received in culture bottles Performed at [facility], [address] [place], [place] [address]    Culture   Final    NO GROWTH 3 DAYS Performed at [facility], [place] [address], [place], [place] [address]    Report Status PENDING   Incomplete  Urine culture     Status: None   Collection Time: 03/17/20  4:12 PM   Specimen: In/Out Cath Urine  Result Value Ref Range Status   Specimen Description   Final    IN/OUT CATH URINE Performed at [facility], [address] [place], [place] [address]    Special Requests   Final    NONE Performed at [facility], [address] [place], [place] [address]    Culture   Final    NO GROWTH Performed at [facility], [place] [address], [place],  [address]    Report Status 03/18/2020 FINAL  Final    Radiology Reports DG Chest Port 1 View  Result Date: 03/17/2020 CLINICAL DATA:  Question sepsis.  COVID-19 positive. EXAM: PORTABLE CHEST 1 VIEW COMPARISON:  03/06/2020 FINDINGS: Mild patchy perihilar airspace disease has developed since the prior study. Small right pleural effusion also new. No vascular congestion. Heart size within normal limits. IMPRESSION: Mild perihilar airspace disease bilaterally and small right effusion. Findings most likely due to COVID pneumonia. Electronically Signed   By: Franchot Gallo M.D.   On: 03/17/2020 12:45   DG Chest Port 1 View  Result Date: 03/06/2020 CLINICAL DATA:  Questionable sepsis - evaluate for abnormality Tremors since chemo this morning. EXAM: PORTABLE CHEST 1 VIEW COMPARISON:  09/03/2018 FINDINGS: The cardiomediastinal contours are normal. Subsegmental opacities at both lung bases. Pulmonary vasculature is normal. No consolidation, pleural effusion, or pneumothorax. No acute osseous abnormalities are seen. No bands degenerative change in the shoulders. IMPRESSION: Subsegmental bibasilar atelectasis. Electronically Signed   By: Keith Rake M.D.   On: 03/06/2020 18:18

## 2020-03-22 LAB — PROTIME-INR
INR: 3.9 — ABNORMAL HIGH (ref 0.8–1.2)
Prothrombin Time: 37.2 seconds — ABNORMAL HIGH (ref 11.4–15.2)

## 2020-03-22 LAB — CULTURE, BLOOD (ROUTINE X 2)
Culture: NO GROWTH
Culture: NO GROWTH
Special Requests: ADEQUATE

## 2020-03-22 LAB — BRAIN NATRIURETIC PEPTIDE: B Natriuretic Peptide: 98.1 pg/mL (ref 0.0–100.0)

## 2020-03-22 MED ORDER — FUROSEMIDE 40 MG PO TABS
40.0000 mg | ORAL_TABLET | Freq: Once | ORAL | Status: AC
  Administered 2020-03-22: 40 mg via ORAL
  Filled 2020-03-22: qty 1

## 2020-03-22 MED ORDER — AMLODIPINE BESYLATE 10 MG PO TABS
10.0000 mg | ORAL_TABLET | Freq: Every day | ORAL | Status: DC
  Administered 2020-03-22 – 2020-03-23 (×2): 10 mg via ORAL
  Filled 2020-03-22 (×2): qty 1

## 2020-03-22 NOTE — Progress Notes (Signed)
PROGRESS NOTE                                                                                                                                                                                                             Patient Demographics:    Victor Cooper, is a 79 y.o. male, DOB - 10/09/1940, DGU:[id number]  Outpatient Primary MD for the patient is Merwyn Katos    LOS - 4  Admit date - 03/17/2020    Chief Complaint  Patient presents with  . Fatigue    Covid +       Brief Narrative - Victor Cooper is a 79 y.o. male with medical history significant of COPD; PE, on Coumadin; bladder cancer, on active chemotherapy; HTN; and HLD who tested + for COVID-19 on 9/2, who presented to [facility] with lethargy and SOB.  He was previously hospitalized for COVID-19 PNA and sepsis from 9/3-6 and treated with Remdesivir (completed outpatient infusion) and steroids; he did not require oxygen at the time of discharge, comes back with dehydration, weakness and fevers.   Subjective:   Patient in bed, appears comfortable, denies any headache, no fever, no chest pain or pressure, no shortness of breath , no abdominal pain. No focal weakness.   Assessment  & Plan :     1. Recent Acute Covid 19 Viral Pneumonitis  - this was adequately treated on recent admission and seems to have resolved.  Encouraged the patient to sit up in chair in the daytime use I-S and flutter valve for pulmonary toiletry and then prone in bed when at night.  Will advance activity and titrate down oxygen as possible.    SpO2: 90 % O2 Flow Rate (L/min): 1.5 L/min  Recent Labs  Lab 03/17/20 1151 03/17/20 1151 03/18/20 0602 03/18/20 1224 03/19/20 0543 03/20/20 0500 03/21/20 0159 03/22/20 0337  WBC 10.1  --   --   --  8.4 7.1 8.4  --   PLT 227  --   --   --  259 308 329  --   CRP  --   --   --  25.2* 14.6* 7.9* 5.3*  --   BNP  --   --   --   --  25.2  108.2* 81.1 98.1  DDIMER  --   --   --  0.66* 0.40  --   --   --   PROCALCITON  --   --   --  0.59 0.28 0.14 <0.10  --   AST 54*  --   --   --  52* 47* 43*  --   ALT 38  --   --   --  45* 53* 56*  --   ALKPHOS 37*  --   --   --  37* 36* 36*  --   BILITOT 0.9  --   --   --  0.7 0.6 0.6  --   ALBUMIN 3.0*  --   --   --  2.3* 2.3* 2.4*  --   INR 4.3*   < > 5.0*  --  5.0* 5.4* 4.5* 3.9*  LATICACIDVEN 1.5  --   --   --   --   --   --   --    < > = values in this interval not displayed.      2.  Rigors at home along with dehydration and generalized weakness.  Likely due to UTI, continue present antibiotics for now, procalcitonin was mildly elevated and CRP was high as well, taper down steroids. Follow final cultures negative till 03/22/2020.  3.  History of bladder cancer.  Under the care of Dr. Amalia Hailey in [facility], undergoing bladder chemotherapy, outpatient monitoring no acute issues.  Does put him at high risk for UTIs.  4.  Dehydration with hypernatremia and AKI.  Has been adequately hydrated with IV fluids.  Few crackles on exam gentle 1 dose Lasix on 03/22/2020 and monitor.   5.  COPD.  No acute issues.  6.  Dyslipidemia.  On statins.  7.  History of PE.  On Coumadin.  Pharmacy monitoring INR.  INR supratherapeutic at 5.4, gentle 0.5 of IV vitamin K on 03/20/2020 and monitor.  8.  Obesity.  BMI greater than 32.  Follow with PCP for weight loss.  9.  Weakness and deconditioning.  Doing well with PT home PT ordered.  He does not want to go to SNF.  If stable likely discharge in the next 1 to 2 days.     Condition -  Guarded  Family Communication  :  Daughter Kyshon Tolliver, [phone] - 03/19/20, 03/20/20, called again on 03/21/2020 at 9:20 AM.  No response.  Updated again on 03/22/2020, informed that patient was adamant to be discharged AMA but we have been able to counsel him.  Code Status :  DNR  Consults  :  None  Procedures  :    PUD Prophylaxis : PPI  Disposition Plan  :     Status is: Inpatient  Remains inpatient appropriate because:IV treatments appropriate due to intensity of illness or inability to take PO   Dispo: The patient is from: Home              Anticipated d/c is to: Home              Anticipated d/c date is: > 3 days              Patient currently is not medically stable to d/c.   DVT Prophylaxis  : Coumadin  Lab Results  Component Value Date   INR 3.9 (H) 03/22/2020   INR 4.5 (HH) 03/21/2020   INR 5.4 (HH) 03/20/2020     Lab Results  Component Value Date   PLT 329 03/21/2020    Diet :  Diet Order  DIET SOFT Room service appropriate? Yes; Fluid consistency: Thin  Diet effective now                  Inpatient Medications  Scheduled Meds: . amLODipine  10 mg Oral Daily  . vitamin C  500 mg Oral Daily  . aspirin EC  81 mg Oral Daily  . cephALEXin  500 mg Oral Q8H  . furosemide  40 mg Oral Once  . methylPREDNISolone (SOLU-MEDROL) injection  20 mg Intravenous Daily  . pantoprazole  40 mg Oral Daily  . rosuvastatin  20 mg Oral Daily  . Warfarin - Pharmacist Dosing Inpatient   Does not apply q1600  . zinc sulfate  220 mg Oral Daily   Continuous Infusions:  PRN Meds:.acetaminophen, albuterol, chlorpheniramine-HYDROcodone, guaiFENesin-dextromethorphan, [DISCONTINUED] ondansetron **OR** ondansetron (ZOFRAN) IV  Antibiotics  :    Anti-infectives (From admission, onward)   Start     Dose/Rate Route Frequency Ordered Stop   03/20/20 1400  cephALEXin (KEFLEX) capsule 500 mg       Note to Pharmacy: Can adjust for UTI   500 mg Oral Every 8 hours 03/20/20 1112 03/23/20 0559   03/20/20 1200  doxycycline (VIBRA-TABS) tablet 100 mg  Status:  Discontinued        100 mg Oral Every 12 hours 03/20/20 1105 03/20/20 1112   03/18/20 2000  ceFEPIme (MAXIPIME) 2 g in sodium chloride 0.9 % 100 mL IVPB  Status:  Discontinued        2 g 200 mL/hr over 30 Minutes Intravenous Every 8 hours 03/18/20 1238 03/18/20 1407   03/18/20  1500  cefTRIAXone (ROCEPHIN) 2 g in sodium chloride 0.9 % 100 mL IVPB  Status:  Discontinued        2 g 200 mL/hr over 30 Minutes Intravenous Every 24 hours 03/18/20 1407 03/20/20 1105   03/18/20 1500  azithromycin (ZITHROMAX) 500 mg in sodium chloride 0.9 % 250 mL IVPB  Status:  Discontinued        500 mg 250 mL/hr over 60 Minutes Intravenous Every 24 hours 03/18/20 1407 03/20/20 1105   03/18/20 1400  vancomycin (VANCOCIN) IVPB 1000 mg/200 mL premix  Status:  Discontinued        1,000 mg 200 mL/hr over 60 Minutes Intravenous Every 12 hours 03/18/20 1254 03/18/20 1407   03/18/20 0245  vancomycin (VANCOCIN) 1-5 GM/200ML-% IVPB       Note to Pharmacy: Mickie Hillier   : cabinet override      03/18/20 0245 03/18/20 0412   03/18/20 0200  vancomycin (VANCOREADY) IVPB 750 mg/150 mL  Status:  Discontinued        750 mg 150 mL/hr over 60 Minutes Intravenous Every 12 hours 03/17/20 1417 03/18/20 1254   03/18/20 0000  ceFEPIme (MAXIPIME) 2 g in sodium chloride 0.9 % 100 mL IVPB  Status:  Discontinued        2 g 200 mL/hr over 30 Minutes Intravenous Every 12 hours 03/17/20 1414 03/18/20 1238   03/17/20 1330  vancomycin (VANCOCIN) IVPB 1000 mg/200 mL premix        1,000 mg 200 mL/hr over 60 Minutes Intravenous  Once 03/17/20 1212 03/17/20 1509   03/17/20 1200  vancomycin (VANCOCIN) IVPB 1000 mg/200 mL premix        1,000 mg 200 mL/hr over 60 Minutes Intravenous  Once 03/17/20 1148 03/17/20 1409   03/17/20 1200  ceFEPIme (MAXIPIME) 2 g in sodium chloride 0.9 % 100 mL IVPB  2 g 200 mL/hr over 30 Minutes Intravenous  Once 03/17/20 1148 03/17/20 1240       Time Spent in minutes  30   Lala Lund M.D on 03/22/2020 at 11:04 AM  To page go to [email or web address] - password [facility]  [facility] -  Office  [phone]   See all Orders from today for further details    Objective:   Vitals:   03/21/20 1402 03/21/20 1403 03/21/20 2021 03/22/20 0442  BP:  (!) 150/79 (!) 155/80 (!) 155/76   Pulse: 76 73 62 61  Resp:   19 20  Temp: 97.9 F (36.6 C)  98 F (36.7 C) 97.9 F (36.6 C)  TempSrc: Oral  Oral Oral  SpO2: 94% 94% 93% 90%  Weight:        Wt Readings from Last 3 Encounters:  03/20/20 95.1 kg  03/09/20 99 kg  10/10/17 103.4 kg     Intake/Output Summary (Last 24 hours) at 03/22/2020 1104 Last data filed at 03/22/2020 0529 Gross per 24 hour  Intake 1170 ml  Output 451 ml  Net 719 ml     Physical Exam  Awake Alert, No new F.N deficits, Normal affect [place].AT,PERRAL Supple Neck,No JVD, No cervical lymphadenopathy appriciated.  Symmetrical Chest wall movement, Good air movement bilaterally, rales RRR,No Gallops, Rubs or new Murmurs, No Parasternal Heave +ve B.Sounds, Abd Soft, No tenderness, No organomegaly appriciated, No rebound - guarding or rigidity. No Cyanosis, Clubbing or edema, No new Rash or bruise      Data Review:    CBC Recent Labs  Lab 03/17/20 1151 03/19/20 0543 03/20/20 0500 03/21/20 0159  WBC 10.1 8.4 7.1 8.4  HGB 13.7 11.4* 11.1* 11.1*  HCT 39.6 33.5* 33.5* 33.4*  PLT 227 259 308 329  MCV 86.1 86.6 87.7 88.1  MCH 29.8 29.5 29.1 29.3  MCHC 34.6 34.0 33.1 33.2  RDW 15.2 15.5 15.5 15.5  LYMPHSABS 0.6* 0.6* 0.4* 0.5*  MONOABS 0.3 0.3 0.2 0.4  EOSABS 0.0 0.0 0.0 0.0  BASOSABS 0.0 0.0 0.0 0.0    Recent Labs  Lab 03/17/20 1151 03/17/20 1151 03/18/20 0602 03/18/20 1224 03/19/20 0543 03/20/20 0500 03/21/20 0159 03/22/20 0337  NA 127*  --  128*  --  132* 135 136  --   K 4.3  --  4.4  --  4.5 4.7 4.1  --   CL 96*  --  98  --  102 102 105  --   CO2 19*  --  20*  --  21* 22 22  --   GLUCOSE 110*  --  182*  --  195* 185* 193*  --   BUN 27*  --  27*  --  26* 31* 32*  --   CREATININE 1.59*  --  1.03  --  1.14 1.25* 1.19  --   CALCIUM 8.2*  --  8.2*  --  8.6* 8.5* 8.6*  --   AST 54*  --   --   --  52* 47* 43*  --   ALT 38  --   --   --  45* 53* 56*  --   ALKPHOS 37*  --   --   --  37* 36* 36*  --   BILITOT 0.9  --   --    --  0.7 0.6 0.6  --   ALBUMIN 3.0*  --   --   --  2.3* 2.3* 2.4*  --   MG  --   --   --   --  2.3 2.6* 2.6*  --   CRP  --   --   --  25.2* 14.6* 7.9* 5.3*  --   DDIMER  --   --   --  0.66* 0.40  --   --   --   PROCALCITON  --   --   --  0.59 0.28 0.14 <0.10  --   LATICACIDVEN 1.5  --   --   --   --   --   --   --   INR 4.3*   < > 5.0*  --  5.0* 5.4* 4.5* 3.9*  BNP  --   --   --   --  25.2 108.2* 81.1 98.1   < > = values in this interval not displayed.    ------------------------------------------------------------------------------------------------------------------ No results for input(s): CHOL, HDL, LDLCALC, TRIG, CHOLHDL, LDLDIRECT in the last 72 hours.  No results found for: HGBA1C ------------------------------------------------------------------------------------------------------------------ No results for input(s): TSH, T4TOTAL, T3FREE, THYROIDAB in the last 72 hours.  Invalid input(s): FREET3  Cardiac Enzymes No results for input(s): CKMB, TROPONINI, MYOGLOBIN in the last 168 hours.  Invalid input(s): CK ------------------------------------------------------------------------------------------------------------------    Component Value Date/Time   BNP 98.1 03/22/2020 3151    Micro Results Recent Results (from the past 240 hour(s))  Blood Culture (routine x 2)     Status: None (Preliminary result)   Collection Time: 03/17/20 11:48 AM   Specimen: BLOOD LEFT WRIST  Result Value Ref Range Status   Specimen Description   Final    BLOOD LEFT WRIST Performed at [facility], [address] [place], [place] [address]    Special Requests   Final    BOTTLES DRAWN AEROBIC AND ANAEROBIC Blood Culture adequate volume Performed at [facility], [address], [place], [place] [address]    Culture   Final    NO GROWTH 4 DAYS Performed at [facility], [place] [address], [place], [place] [address]    Report Status PENDING  Incomplete  Blood  Culture (routine x 2)     Status: None (Preliminary result)   Collection Time: 03/17/20 12:25 PM   Specimen: BLOOD RIGHT FOREARM  Result Value Ref Range Status   Specimen Description   Final    BLOOD RIGHT FOREARM Performed at [facility], [address] [place], [place] [address]    Special Requests   Final    BOTTLES DRAWN AEROBIC AND ANAEROBIC Blood Culture results may not be optimal due to an inadequate volume of blood received in culture bottles Performed at [facility], [address] [place], [place] [address]    Culture   Final    NO GROWTH 4 DAYS Performed at [facility], [place] [address] [place], [place] [address]    Report Status PENDING  Incomplete  Urine culture     Status: None   Collection Time: 03/17/20  4:12 PM   Specimen: In/Out Cath Urine  Result Value Ref Range Status   Specimen Description   Final    IN/OUT CATH URINE Performed at [facility], [address] [place], [place] [address]    Special Requests   Final    NONE Performed at [facility], [address] [place], [place] [address]    Culture   Final    NO GROWTH Performed at [facility], [place] [address], [place], [place] [address]  Report Status 03/18/2020 FINAL  Final    Radiology Reports DG Chest Port 1 View  Result Date: 03/21/2020 CLINICAL DATA:  79 year old male with a history effusion and COVID positive EXAM: PORTABLE CHEST 1 VIEW COMPARISON:  03/17/2020 FINDINGS: Cardiomediastinal silhouette unchanged in size and contour. No pneumothorax. No pleural effusion. Reticular opacities of the lungs.  No confluent airspace disease. No pleural effusion.  No displaced fracture IMPRESSION: Reticular opacities of the lungs, potentially representing atypical infection. No evidence of pleural effusion Electronically Signed   By: Corrie Mckusick D.O.   On: 03/21/2020 11:19   DG Chest Port 1 View  Result Date: 03/17/2020 CLINICAL DATA:   Question sepsis.  COVID-19 positive. EXAM: PORTABLE CHEST 1 VIEW COMPARISON:  03/06/2020 FINDINGS: Mild patchy perihilar airspace disease has developed since the prior study. Small right pleural effusion also new. No vascular congestion. Heart size within normal limits. IMPRESSION: Mild perihilar airspace disease bilaterally and small right effusion. Findings most likely due to COVID pneumonia. Electronically Signed   By: Franchot Gallo M.D.   On: 03/17/2020 12:45   DG Chest Port 1 View  Result Date: 03/06/2020 CLINICAL DATA:  Questionable sepsis - evaluate for abnormality Tremors since chemo this morning. EXAM: PORTABLE CHEST 1 VIEW COMPARISON:  09/03/2018 FINDINGS: The cardiomediastinal contours are normal. Subsegmental opacities at both lung bases. Pulmonary vasculature is normal. No consolidation, pleural effusion, or pneumothorax. No acute osseous abnormalities are seen. No bands degenerative change in the shoulders. IMPRESSION: Subsegmental bibasilar atelectasis. Electronically Signed   By: Keith Rake M.D.   On: 03/06/2020 18:18

## 2020-03-22 NOTE — Plan of Care (Signed)

## 2020-03-22 NOTE — Progress Notes (Signed)
ANTICOAGULATION CONSULT NOTE - Follow Up Consult  Pharmacy Consult for warfarin Indication: pulmonary embolus and DVT  No Known Allergies  Patient Measurements: Weight: 95.1 kg (209 lb 10.5 oz)   Vital Signs: Temp: 97.9 F (36.6 C) (09/19 0442) Temp Source: Oral (09/19 0442) BP: 155/76 (09/19 0442) Pulse Rate: 61 (09/19 0442)  Labs: Recent Labs    03/20/20 0500 03/21/20 0159 03/22/20 0337  HGB 11.1* 11.1*  --   HCT 33.5* 33.4*  --   PLT 308 329  --   LABPROT 47.6* 41.5* 37.2*  INR 5.4* 4.5* 3.9*  CREATININE 1.25* 1.19  --     Estimated Creatinine Clearance: 59.2 mL/min (by C-G formula based on SCr of 1.19 mg/dL).   Assessment: 79 y/o male admitted on 9/14 with pneumonia with recent admission for [place]. Warfarin was ordered to be continued for history of DVT/PE. INR remains elevated, but is downtrending. One dose of 0.5mg  IV of vitamin K given 9/17 and warfarin being held. INR (9/19) is 3.9.  Most recent CBC (9/18) stable; Hgb 11.1, plt 329   Goal of Therapy:  INR 2-3 Monitor platelets by anticoagulation protocol: Yes   Plan:  Hold warfarin today Monitor CBC in AM, s/sx bleeding, and daily INR    Carolin Guernsey  PGY1 pharmacy resident 03/22/2020,8:17 AM

## 2020-03-22 NOTE — Progress Notes (Signed)
SATURATION QUALIFICATIONS: (This note is used to comply with regulatory documentation for home oxygen)  Patient Saturations on Room Air at Rest = 83%  Patient Saturations on Room Air while Ambulating = 80%  Patient Saturations on 2 Liters of oxygen while Ambulating = 88%  Please briefly explain why patient needs home oxygen:

## 2020-03-23 LAB — CBC WITH DIFFERENTIAL/PLATELET
Abs Immature Granulocytes: 0.21 10*3/uL — ABNORMAL HIGH (ref 0.00–0.07)
Basophils Absolute: 0 10*3/uL (ref 0.0–0.1)
Basophils Relative: 0 %
Eosinophils Absolute: 0 10*3/uL (ref 0.0–0.5)
Eosinophils Relative: 0 %
HCT: 33.7 % — ABNORMAL LOW (ref 39.0–52.0)
Hemoglobin: 11.5 g/dL — ABNORMAL LOW (ref 13.0–17.0)
Immature Granulocytes: 3 %
Lymphocytes Relative: 11 %
Lymphs Abs: 0.8 10*3/uL (ref 0.7–4.0)
MCH: 30.6 pg (ref 26.0–34.0)
MCHC: 34.1 g/dL (ref 30.0–36.0)
MCV: 89.6 fL (ref 80.0–100.0)
Monocytes Absolute: 0.7 10*3/uL (ref 0.1–1.0)
Monocytes Relative: 9 %
Neutro Abs: 5.5 10*3/uL (ref 1.7–7.7)
Neutrophils Relative %: 77 %
Platelets: 379 10*3/uL (ref 150–400)
RBC: 3.76 MIL/uL — ABNORMAL LOW (ref 4.22–5.81)
RDW: 15.8 % — ABNORMAL HIGH (ref 11.5–15.5)
WBC: 7.2 10*3/uL (ref 4.0–10.5)
nRBC: 0 % (ref 0.0–0.2)

## 2020-03-23 LAB — COMPREHENSIVE METABOLIC PANEL
ALT: 74 U/L — ABNORMAL HIGH (ref 0–44)
AST: 41 U/L (ref 15–41)
Albumin: 2.3 g/dL — ABNORMAL LOW (ref 3.5–5.0)
Alkaline Phosphatase: 32 U/L — ABNORMAL LOW (ref 38–126)
Anion gap: 9 (ref 5–15)
BUN: 24 mg/dL — ABNORMAL HIGH (ref 8–23)
CO2: 24 mmol/L (ref 22–32)
Calcium: 8.5 mg/dL — ABNORMAL LOW (ref 8.9–10.3)
Chloride: 104 mmol/L (ref 98–111)
Creatinine, Ser: 1.2 mg/dL (ref 0.61–1.24)
GFR calc Af Amer: >60 mL/min (ref >60)
GFR calc non Af Amer: 58 mL/min — ABNORMAL LOW (ref >60)
Glucose, Bld: 157 mg/dL — ABNORMAL HIGH (ref 70–99)
Potassium: 4.2 mmol/L (ref 3.5–5.1)
Sodium: 137 mmol/L (ref 135–145)
Total Bilirubin: 0.9 mg/dL (ref 0.3–1.2)
Total Protein: 6.6 g/dL (ref 6.5–8.1)

## 2020-03-23 LAB — D-DIMER, QUANTITATIVE: D-Dimer, Quant: 0.62 ug/mL-FEU — ABNORMAL HIGH (ref 0.00–0.50)

## 2020-03-23 LAB — PROTIME-INR
INR: 2.7 — ABNORMAL HIGH (ref 0.8–1.2)
Prothrombin Time: 27.7 seconds — ABNORMAL HIGH (ref 11.4–15.2)

## 2020-03-23 LAB — MAGNESIUM: Magnesium: 2.3 mg/dL (ref 1.7–2.4)

## 2020-03-23 LAB — C-REACTIVE PROTEIN: CRP: 6.1 mg/dL — ABNORMAL HIGH (ref <1.0)

## 2020-03-23 LAB — BRAIN NATRIURETIC PEPTIDE: B Natriuretic Peptide: 49 pg/mL (ref 0.0–100.0)

## 2020-03-23 MED ORDER — ISOSORBIDE MONONITRATE ER 30 MG PO TB24
30.0000 mg | ORAL_TABLET | Freq: Every day | ORAL | Status: DC
  Administered 2020-03-23: 30 mg via ORAL
  Filled 2020-03-23: qty 1

## 2020-03-23 MED ORDER — ISOSORBIDE MONONITRATE ER 30 MG PO TB24
30.0000 mg | ORAL_TABLET | Freq: Every day | ORAL | 0 refills | Status: DC
Start: 2020-03-23 — End: 2021-09-27

## 2020-03-23 MED ORDER — WARFARIN SODIUM 5 MG PO TABS: 5.0000 mg | ORAL_TABLET | Freq: Once | ORAL | Status: DC

## 2020-03-23 MED ORDER — CEPHALEXIN 500 MG PO CAPS: 500.0000 mg | ORAL_CAPSULE | Freq: Three times a day (TID) | ORAL | 0 refills | Status: AC

## 2020-03-23 MED ORDER — PANTOPRAZOLE SODIUM 40 MG PO TBEC
40.0000 mg | DELAYED_RELEASE_TABLET | Freq: Every day | ORAL | 0 refills | Status: DC
Start: 2020-03-24 — End: 2021-09-27

## 2020-03-23 MED ORDER — AMLODIPINE BESYLATE 10 MG PO TABS
10.0000 mg | ORAL_TABLET | Freq: Every day | ORAL | 0 refills | Status: DC
Start: 2020-03-24 — End: 2020-07-12

## 2020-03-23 MED ORDER — CARVEDILOL 3.125 MG PO TABS: 3.1250 mg | ORAL_TABLET | Freq: Two times a day (BID) | ORAL | Status: DC

## 2020-03-23 NOTE — Progress Notes (Signed)
ANTICOAGULATION CONSULT NOTE - Follow Up Consult  Pharmacy Consult for warfarin Indication: pulmonary embolus and DVT  No Known Allergies  Patient Measurements: Weight: 95.1 kg (209 lb 10.5 oz)   Vital Signs: Temp: 97.6 F (36.4 C) (09/20 0450) Temp Source: Oral (09/20 0450) BP: 145/81 (09/20 0450) Pulse Rate: 67 (09/20 0450)  Labs: Recent Labs    03/21/20 0159 03/22/20 0337 03/23/20 0348  HGB 11.1*  --  11.5*  HCT 33.4*  --  33.7*  PLT 329  --  379  LABPROT 41.5* 37.2* 27.7*  INR 4.5* 3.9* 2.7*  CREATININE 1.19  --  1.20    Estimated Creatinine Clearance: 58.7 mL/min (by C-G formula based on SCr of 1.2 mg/dL).   Assessment: 79 y/o male admitted on 9/14 with pneumonia with recent admission for [place]. Warfarin was ordered to be continued for history of DVT/PE. One dose of 0.5mg  IV of vitamin K given 9/17  INR down to 2.7 today  CBC stable  Goal of Therapy:  INR 2-3 Monitor platelets by anticoagulation protocol: Yes   Plan:  Warfarin 5 mg po x 1 dose today at 1600 pm Daily INR  Thank you Anette Guarneri, PharmD [phone]  03/23/2020,8:43 AM

## 2020-03-23 NOTE — Discharge Summary (Signed)
Victor Cooper FXJ:[id number] DOB: 10/15/1940 DOA: 03/17/2020  PCP: Heywood Bene, PA-C  Admit date: 03/17/2020  Discharge date: 03/23/2020  Admitted From: Home   Disposition:  Home   Recommendations for Outpatient Follow-up:   Follow up with PCP in 1-2 weeks  PCP Please obtain BMP/CBC, 2 view CXR in 1week,  (see Discharge instructions)   PCP Please follow up on the following pending results: INR, CBC, CMP and a two-view chest x-ray in 7 to 10 days.   Home Health: PT,RN   Equipment/Devices: 3 and 1, rolling walker, 2 L oxygen Consultations: None  Discharge Condition: Stable    CODE STATUS: Full    Diet Recommendation: Soft-heart healthy     Chief Complaint  Patient presents with  . Fatigue    Covid +     Brief history of present illness from the day of admission and additional interim summary    Victor Cooper a 79 y.o.malewith medical history significant ofCOPD; PE, on Coumadin;bladder cancer, on active chemotherapy;HTN; and HLD who tested + for COVID-19 on 9/2,whopresented to [facility] lethargy and SOB.He was previously hospitalized for COVID-19 PNA and sepsis from 9/3-6 and treated with Remdesivir (completed outpatient infusion) and steroids; he did not require oxygen at the time of discharge, comes back with dehydration, weakness and fevers.                                                                 Hospital Course   1. Recent Acute Covid 19 Viral Pneumonitis  - this was adequately treated on recent admission and seems to have resolved.  Will get 2 L of oxygen upon discharge, he is symptom-free on it.   Recent Labs  Lab 03/18/20 1224 03/19/20 0543 03/20/20 0500 03/21/20 0159 03/22/20 0337 03/23/20 0348  CRP 25.2* 14.6* 7.9* 5.3*  --  6.1*  DDIMER 0.66* 0.40  --   --    --  0.62*  FERRITIN 1,380* 1,354*  --   --   --   --   BNP  --  25.2 108.2* 81.1 98.1 49.0  PROCALCITON 0.59 0.28 0.14 <0.10  --   --     Hepatic Function Latest Ref Rng & Units 03/23/2020 03/21/2020 03/20/2020  Total Protein 6.5 - 8.1 g/dL 6.6 6.4(L) 6.2(L)  Albumin 3.5 - 5.0 g/dL 2.3(L) 2.4(L) 2.3(L)  AST 15 - 41 U/L 41 43(H) 47(H)  ALT 0 - 44 U/L 74(H) 56(H) 53(H)  Alk Phosphatase 38 - 126 U/L 32(L) 36(L) 36(L)  Total Bilirubin 0.3 - 1.2 mg/dL 0.9 0.6 0.6      2.  Rigors at home along with dehydration and generalized weakness.    He appears to be UTI however could have had bronchitis as well, urine cultures remain negative, he responded well to initially  Rocephin and then to Keflex, all cultures negative will get 3 more days of oral Keflex upon discharge and will be following with his PCP within a week.  3.  History of bladder cancer.  Under the care of Dr. Amalia Hailey in [facility], undergoing bladder chemotherapy, outpatient monitoring no acute issues.  Does put him at high risk for UTIs.  4.  Dehydration with hypernatremia and AKI.  Has been adequately hydrated with IV fluids.  Few crackles on exam gentle 1 dose Lasix on 03/22/2020 and monitor.  Discontinue ARB and diuretic combination upon discharge.   5.  COPD.  No acute issues.  6.  Dyslipidemia.  On statins.  7.  History of PE.  On Coumadin.  Pharmacy monitoring INR.  INR supratherapeutic at 5.4 on the date of admission, gentle 0.5 of IV vitamin K on 03/20/2020 and monitor.  INR now stable PCP to monitor.  8.  Obesity.  BMI greater than 32.  Follow with PCP for weight loss.  9.    Hypertension.  Has been placed on combination of Norvasc and Imdur, PCP to monitor and adjust, he was dehydrated upon admission with mild AKI and may be we can avoid diuretics and ARB on him in the future.    10. Weakness and deconditioning.  Doing well with PT home PT ordered.  He does not want to go to SNF.  Overall clinically improved will be  discharged with [facility] PT.   Discharge diagnosis     Principal Problem:   Hypovolemia due to dehydration Active Problems:   Hypertension   High cholesterol   COPD (chronic obstructive pulmonary disease) (HCC)   AKI (acute kidney injury) ([place])   Pulmonary embolism ([place])   Bladder cancer ([place])   COVID-19 virus infection    Discharge instructions    Discharge Instructions    Discharge instructions   Complete by: As directed    Follow with Primary MD Heywood Bene, PA-C in 7 days   Get CBC, CMP, INR, 2 view Chest X ray -  checked next visit within 1 week by Primary MD    Activity: As tolerated with Full fall precautions use walker/cane & assistance as needed  Disposition Home   Diet: Soft with feeding assistance and aspiration precautions.  Special Instructions: If you have smoked or chewed Tobacco  in the last 2 yrs please stop smoking, stop any regular Alcohol  and or any Recreational drug use.  On your next visit with your primary care physician please Get Medicines reviewed and adjusted.  Please request your Prim.MD to go over all Hospital Tests and Procedure/Radiological results at the follow up, please get all Hospital records sent to your Prim MD by signing hospital release before you go home.  If you experience worsening of your admission symptoms, develop shortness of breath, life threatening emergency, suicidal or homicidal thoughts you must seek medical attention immediately by calling 911 or calling your MD immediately  if symptoms less severe.  You Must read complete instructions/literature along with all the possible adverse reactions/side effects for all the Medicines you take and that have been prescribed to you. Take any new Medicines after you have completely understood and accpet all the possible adverse reactions/side effects.   Increase activity slowly   Complete by: As directed       Discharge Medications   Allergies as of 03/23/2020   No Known  Allergies     Medication List    STOP taking these medications  olmesartan-hydrochlorothiazide 40-25 MG tablet Commonly known as: BENICAR HCT     TAKE these medications   acetaminophen 650 MG CR tablet Commonly known as: TYLENOL Take 1,300 mg by mouth daily as needed for pain (arthritis).   acetaminophen 500 MG tablet Commonly known as: TYLENOL Take 1,000 mg by mouth every 6 (six) hours as needed for fever.   albuterol 108 (90 Base) MCG/ACT inhaler Commonly known as: VENTOLIN HFA Inhale 2 puffs into the lungs every 6 (six) hours. What changed:   when to take this  reasons to take this   amLODipine 10 MG tablet Commonly known as: NORVASC Take 1 tablet (10 mg total) by mouth daily. Start taking on: March 24, 2020   ascorbic acid 500 MG tablet Commonly known as: VITAMIN C Take 1 tablet (500 mg total) by mouth daily.   aspirin EC 81 MG tablet Take 81 mg by mouth daily.   cephALEXin 500 MG capsule Commonly known as: KEFLEX Take 1 capsule (500 mg total) by mouth 3 (three) times daily for 9 doses.   isosorbide mononitrate 30 MG 24 hr tablet Commonly known as: IMDUR Take 1 tablet (30 mg total) by mouth daily.   MENTHOL EX Apply 1 application topically daily as needed (knee pain).   pantoprazole 40 MG tablet Commonly known as: PROTONIX Take 1 tablet (40 mg total) by mouth daily. Start taking on: March 24, 2020   rosuvastatin 20 MG tablet Commonly known as: CRESTOR Take 20 mg by mouth daily.   Stiolto Respimat 2.5-2.5 MCG/ACT Aers Generic drug: Tiotropium Bromide-Olodaterol Inhale 1 puff into the lungs daily.   warfarin 5 MG tablet Commonly known as: COUMADIN Take 5-7.5 mg by mouth See admin instructions. Take 1 1/2 tablets (7.5 mg) on Monday, Wednesday, Friday mornings, take 1 tablet (5 mg) on _0 /16/21 3254           Follow-up Information    Heywood Bene, PA-C. Schedule an appointment as soon as possible for a visit in 1 week(s).   Specialty: Physician Assistant Contact information: [address] [place] [address] [phone]        [facility], [facility] Follow up.   Specialty: [place] Why: for home health PT RN Contact information: [place] [address] [phone]  [facility], [facility] Follow up.   Why: for equipment rolling walker nad 3:1 already delivered Contact information: [address] [place] [address] [phone]        Inc, [facility] Follow up.   Why: your oxygen provider Contact information: [address] [place] [address] [phone]               Major procedures and Radiology Reports - PLEASE review detailed and final reports thoroughly  -       DG Chest Port 1 View  Result Date: 03/21/2020 CLINICAL DATA:  79 year old male with a history effusion and COVID positive EXAM: PORTABLE CHEST 1 VIEW COMPARISON:  03/17/2020 FINDINGS: Cardiomediastinal silhouette unchanged in size and contour. No pneumothorax. No pleural effusion. Reticular  opacities of the lungs.  No confluent airspace disease. No pleural effusion.  No displaced fracture IMPRESSION: Reticular opacities of the lungs, potentially representing atypical infection. No evidence of pleural effusion Electronically Signed   By: Corrie Mckusick D.O.   On: 03/21/2020 11:19   DG Chest Port 1 View  Result Date: 03/17/2020 CLINICAL DATA:  Question sepsis.  COVID-19 positive. EXAM: PORTABLE CHEST 1 VIEW COMPARISON:  03/06/2020 FINDINGS: Mild patchy perihilar airspace disease has developed since the prior study. Small right pleural effusion also new. No vascular congestion. Heart size within normal limits. IMPRESSION: Mild perihilar airspace disease bilaterally and small right effusion. Findings most likely due to COVID pneumonia. Electronically Signed   By: Franchot Gallo M.D.   On: 03/17/2020 12:45   DG Chest Port 1 View  Result Date: 03/06/2020 CLINICAL DATA:  Questionable sepsis - evaluate for abnormality Tremors since chemo this morning. EXAM: PORTABLE CHEST 1 VIEW COMPARISON:  09/03/2018 FINDINGS: The cardiomediastinal contours are normal. Subsegmental opacities at both lung bases. Pulmonary vasculature is normal. No consolidation, pleural effusion, or pneumothorax. No acute osseous abnormalities are seen. No bands degenerative change in the shoulders. IMPRESSION: Subsegmental bibasilar atelectasis. Electronically Signed   By: Keith Rake M.D.   On: 03/06/2020 18:18    Micro Results     Recent Results (from the past 240 hour(s))  Blood Culture (routine x 2)     Status: None   Collection Time: 03/17/20 11:48 AM   Specimen: BLOOD LEFT WRIST  Result Value Ref Range Status   Specimen Description   Final    BLOOD LEFT WRIST Performed at [facility], [address] [place], [place] [address]    Special Requests   Final    BOTTLES DRAWN AEROBIC AND ANAEROBIC Blood Culture adequate volume Performed at [facility], [address], [place], [place]  [address]    Culture   Final    NO GROWTH 5 DAYS Performed at [facility], [place] [address], [place], [place] [address]    Report Status 03/22/2020 FINAL  Final  Blood Culture (routine x 2)     Status: None   Collection Time: 03/17/20 12:25 PM   Specimen: BLOOD RIGHT FOREARM  Result Value Ref Range Status   Specimen Description   Final    BLOOD RIGHT FOREARM Performed at [facility], [address] [place], [place] [address]    Special Requests   Final    BOTTLES DRAWN AEROBIC AND ANAEROBIC Blood Culture results may not be optimal due to an inadequate volume of blood received in culture bottles Performed at [facility], [address]  [place], [place] [address]    Culture   Final    NO GROWTH 5 DAYS Performed at [facility], [place] [address], [place], [place] [address]    Report Status 03/22/2020 FINAL  Final  Urine culture     Status: None   Collection Time: 03/17/20  4:12 PM   Specimen: In/Out Cath Urine  Result Value Ref Range Status   Specimen Description   Final    IN/OUT CATH URINE Performed at [facility], [address] [place], [place] [address]    Special Requests   Final    NONE Performed at [facility], [address] [place], [place] [address]    Culture   Final    NO GROWTH Performed at [facility], [place] [address], [place], [place] [address]    Report Status 03/18/2020 FINAL  Final    Today   Subjective    Victor Cooper today has no headache,no chest abdominal pain,no new weakness tingling or numbness, feels much better wants to go home today.     Objective   Blood pressure (!) 145/81, pulse 67, temperature 97.6 F (36.4 C), temperature source Oral, resp. rate 19, weight 95.1 kg, SpO2 92 %.   Intake/Output Summary (Last 24 hours) at 03/23/2020 1107 Last data filed at 03/23/2020 0508 Gross per 24 hour  Intake --  Output 600 ml  Net -600 ml    Exam  Awake Alert, No new F.N  deficits, Normal affect [place].AT,PERRAL Supple Neck,No JVD, No cervical lymphadenopathy appriciated.  Symmetrical Chest wall movement, Good air movement bilaterally, CTAB RRR,No Gallops,Rubs or new Murmurs, No Parasternal Heave +ve B.Sounds, Abd Soft, Non tender, No organomegaly appriciated, No rebound -guarding or rigidity. No Cyanosis, Clubbing or edema, No new Rash or bruise   Data Review   CBC w Diff:  Lab Results  Component Value Date   WBC 7.2 03/23/2020   HGB 11.5 (L) 03/23/2020   HCT 33.7 (L) 03/23/2020   PLT 379 03/23/2020   LYMPHOPCT 11 03/23/2020   MONOPCT 9 03/23/2020   EOSPCT 0 03/23/2020   BASOPCT 0 03/23/2020    CMP:  Lab Results  Component Value Date   NA 137 03/23/2020   K 4.2 03/23/2020   CL 104 03/23/2020   CO2 24 03/23/2020   BUN 24 (H) 03/23/2020   CREATININE 1.20 03/23/2020   PROT 6.6 03/23/2020   ALBUMIN 2.3 (L) 03/23/2020   BILITOT 0.9 03/23/2020   ALKPHOS 32 (L) 03/23/2020   AST 41 03/23/2020   ALT 74 (H) 03/23/2020  .   Total Time in preparing paper work, data evaluation and todays exam - 76 minutes  Lala Lund M.D on 03/23/2020 at 11:07 AM  [facility]   Office  [phone]

## 2020-03-23 NOTE — [place] Notes (Signed)
PT Cancellation Note  Patient Details Name: Victor Cooper MRN: [id number] DOB: 11-18-1940   Cancelled Treatment:    Reason Eval/Treat Not Completed: Other (comment) Pt declined PT today.  Reports he has already walked twice and is expecting to discharge home soon (orders entered) and does not want to overdo.  Will f/u as able if pt still admitted. Abran Richard, PT Acute Rehab Services Pager [phone] [facility] [place] 03/23/2020, 1:16 PM

## 2020-03-23 NOTE — [place] Notes (Signed)
Occupational Therapy Treatment Patient Details Name: Victor Cooper MRN: [id number] DOB: 1941-06-26 Today's Date: 03/23/2020    History of present illness 79 y.o. male who tested (+) COVID-19 on 03/05/20 with hospitalization at [facility] from 9/3-9/6 for COVID-19 PNA and sepsis (d/c home without O2), now readmitted 03/17/20 with diarrhea, weakness and decreased PO intake. Found to be hypoxic, AKI. PMH includes HTN, COPD, PE, bladder CA.   OT comments  Pt progressing towards estbalished OT goals and motivated to perform functional mobility. Pt performing functional mobility in hallway with Min Guard A and RW. Providing education on using shower seat and safety techniques when bathing. SpO2 >85% on 2L. Continue to recommend dc to home with HHOT and will continue to follow acutely as admitted.    Follow Up Recommendations  Home health OT;Supervision/Assistance - 24 hour    Equipment Recommendations  None recommended by OT (PT reporting he already has a shower seat and toilet riser)    Recommendations for Other Services      Precautions / Restrictions Precautions Precautions: Fall       Mobility Bed Mobility               General bed mobility comments: In recliner upon arrival  Transfers Overall transfer level: Needs assistance Equipment used: Rolling walker (2 wheeled) Transfers: Sit to/from Stand Sit to Stand: Min guard         General transfer comment: Increased time and effort, min guard for safety    Balance Overall balance assessment: Needs assistance Sitting-balance support: Feet supported Sitting balance-Leahy Scale: Fair     Standing balance support: Bilateral upper extremity supported;During functional activity;No upper extremity supported Standing balance-Leahy Scale: Fair                             ADL either performed or assessed with clinical judgement   ADL Overall ADL's : Needs assistance/impaired                                    Tub/Shower Transfer Details (indicate cue type and reason): Pt reporting he has a shower seat. Agreeable to use shower seat for rest breaks during bathing Functional mobility during ADLs: Min guard;Rolling walker General ADL Comments: Pt very eager to perform mobility. Providing education on use of shower chair and safety in shower     Vision       Perception     Praxis      Cognition Arousal/Alertness: Awake/alert Behavior During Therapy: WFL for tasks assessed/performed Overall Cognitive Status: No family/caregiver present to determine baseline cognitive functioning Area of Impairment: Following commands;Problem solving;Attention;Awareness                   Current Attention Level: Selective   Following Commands: Follows multi-step commands inconsistently   Awareness: Emergent Problem Solving: Requires verbal cues General Comments: suspect this to be baseline cognition exacerbated by [facility]        Exercises     Shoulder Instructions       General Comments SpO2 98% on 2L at rest. HR 76. SpO2 >87% on 2L during mobility    Pertinent Vitals/ Pain       Pain Assessment: No/denies pain  Home Living  Prior Functioning/Environment              Frequency  Min 2X/week        Progress Toward Goals  OT Goals(current goals can now be found in the care plan section)  Progress towards OT goals: Progressing toward goals  Acute Rehab OT Goals Patient Stated Goal: not lose my ability to walk OT Goal Formulation: With patient Time For Goal Achievement: 04/01/20 Potential to Achieve Goals: Good ADL Goals Pt Will Perform Lower Body Bathing: with modified independence;sitting/lateral leans;sit to/from stand;with adaptive equipment Pt Will Perform Lower Body Dressing: with modified independence;sitting/lateral leans;with adaptive equipment Pt Will Transfer to Toilet: with modified  independence;ambulating;regular height toilet;grab bars Pt Will Perform Tub/Shower Transfer: Shower transfer;with min guard assist;ambulating;shower seat;rolling walker Pt/caregiver will Perform Home Exercise Program: Increased strength;Both right and left upper extremity;With theraband;With written HEP provided Additional ADL Goal #1: Pt will recall and apply 3-5 ECS strategies to ADL routine for improved activity tolerance  Plan Discharge plan remains appropriate;Equipment recommendations need to be updated    Co-evaluation                 AM-PAC OT "6 Clicks" Daily Activity     Outcome Measure   Help from another person eating meals?: A Little Help from another person taking care of personal grooming?: A Little Help from another person toileting, which includes using toliet, bedpan, or urinal?: A Little Help from another person bathing (including washing, rinsing, drying)?: A Lot Help from another person to put on and taking off regular upper body clothing?: A Little Help from another person to put on and taking off regular lower body clothing?: A Little 6 Click Score: 17    End of Session Equipment Utilized During Treatment: Gait belt;Rolling walker;Oxygen  OT Visit Diagnosis: Unsteadiness on feet (R26.81);Other abnormalities of gait and mobility (R26.89);Muscle weakness (generalized) (M62.81)   Activity Tolerance Patient tolerated treatment well   Patient Left with call bell/phone within reach;in chair   Nurse Communication Mobility status        Time: 3016-0109 OT Time Calculation (min): 11 min  Charges: OT General Charges $OT Visit: 1 Visit OT Treatments $Self Care/Home Management : 8-22 mins  [place], OTR/L Acute Rehab Pager: [phone] Office: [place] 03/23/2020, 2:41 PM

## 2020-03-23 NOTE — Care Management Important Message (Signed)
Important Message  Patient Details  Name: Victor Cooper MRN: [id number] Date of Birth: 17-Jun-1941   Medicare Important Message Given:  Yes - Important Message mailed due to current National Emergency  Verbal consent obtained due to current National Emergency  Relationship to patient: Self Contact Name: Charvez Voorhies Call Date: 03/23/20  Time: 1217 Phone: [phone] Outcome: No Answer/Busy Important Message mailed to: Patient address on file    Delorse Lek 03/23/2020, 12:17 PM

## 2020-03-23 NOTE — TOC Transition Note (Signed)
Transition of Care [facility]) - CM/SW Discharge Note   Patient Details  Name: [name] MRN: [id number] Date of Birth: [birth year]  [facility] CM/SW Contact:  Verdell Carmine, RN Phone Number: 03/23/2020, 10:38 AM   Clinical Narrative:     Oxygen set up- [place] with [facility] and has 3:1 and rolling walker. Discharge today..   Final next level of care: [place] Barriers to Discharge: No Barriers Identified   Patient Goals and CMS Choice     Choice offered to / list presented to : Patient  Discharge Placement                       Discharge Plan and Services   Discharge Planning Services: CM Consult Post Acute Care Choice: Home Health          DME Arranged: Oxygen DME Agency: Other - Comment (ro tech) Date DME Agency Contacted: 03/23/20 Time DME Agency Contacted: [phone] Representative spoke with at DME Agency: Melene Muller HH Arranged: RN, PT [facility] Agency: [place] Date [place]: 03/20/20 Time [place]: 1252 Representative spoke with at [place]: [place] ([place]) Interventions     Readmission Risk Interventions No flowsheet data found.

## 2020-03-23 NOTE — Discharge Instructions (Signed)
Follow with Primary MD Heywood Bene, PA-C in 7 days   Get CBC, CMP, INR,  2 view Chest X ray -  checked next visit within 1 week by Primary MD    Activity: As tolerated with Full fall precautions use walker/cane & assistance as needed  Disposition Home   Diet: Soft with feeding assistance and aspiration precautions.  Special Instructions: If you have smoked or chewed Tobacco  in the last 2 yrs please stop smoking, stop any regular Alcohol  and or any Recreational drug use.  On your next visit with your primary care physician please Get Medicines reviewed and adjusted.  Please request your Prim.MD to go over all Hospital Tests and Procedure/Radiological results at the follow up, please get all Hospital records sent to your Prim MD by signing hospital release before you go home.  If you experience worsening of your admission symptoms, develop shortness of breath, life threatening emergency, suicidal or homicidal thoughts you must seek medical attention immediately by calling 911 or calling your MD immediately  if symptoms less severe.  You Must read complete instructions/literature along with all the possible adverse reactions/side effects for all the Medicines you take and that have been prescribed to you. Take any new Medicines after you have completely understood and accpet all the possible adverse reactions/side effects.        Person Under Monitoring Name: Victor Cooper  Location: [address] [place] [address]   Infection Prevention Recommendations for Individuals Confirmed to have, or Being Evaluated for, 2019 Novel Coronavirus (COVID-19) Infection Who Receive Care at Home  Individuals who are confirmed to have, or are being evaluated for, COVID-19 should follow the prevention steps below until a healthcare provider or local or state health department says they can return to normal activities.  Stay home except to get medical care You should restrict  activities outside your home, except for getting medical care. Do not go to work, school, or public areas, and do not use public transportation or taxis.  Call ahead before visiting your doctor Before your medical appointment, call the healthcare provider and tell them that you have, or are being evaluated for, COVID-19 infection. This will help the healthcare provider's office take steps to keep other people from getting infected. Ask your healthcare provider to call the local or state health department.  Monitor your symptoms Seek prompt medical attention if your illness is worsening (e.g., difficulty breathing). Before going to your medical appointment, call the healthcare provider and tell them that you have, or are being evaluated for, COVID-19 infection. Ask your healthcare provider to call the local or state health department.  Wear a facemask You should wear a facemask that covers your nose and mouth when you are in the same room with other people and when you visit a healthcare provider. People who live with or visit you should also wear a facemask while they are in the same room with you.  Separate yourself from other people in your home As much as possible, you should stay in a different room from other people in your home. Also, you should use a separate bathroom, if available.  Avoid sharing household items You should not share dishes, drinking glasses, cups, eating utensils, towels, bedding, or other items with other people in your home. After using these items, you should wash them thoroughly with soap and water.  Cover your coughs and sneezes Cover your mouth and nose with a tissue when you cough  or sneeze, or you can cough or sneeze into your sleeve. Throw used tissues in a lined trash can, and immediately wash your hands with soap and water for at least 20 seconds or use an alcohol-based hand rub.  Wash your [facility] your hands often and thoroughly with soap and  water for at least 20 seconds. You can use an alcohol-based hand sanitizer if soap and water are not available and if your hands are not visibly dirty. Avoid touching your eyes, nose, and mouth with unwashed hands.   Prevention Steps for Caregivers and Household Members of Individuals Confirmed to have, or Being Evaluated for, COVID-19 Infection Being Cared for in the Home  If you live with, or provide care at home for, a person confirmed to have, or being evaluated for, COVID-19 infection please follow these guidelines to prevent infection:  Follow healthcare provider's instructions Make sure that you understand and can help the patient follow any healthcare provider instructions for all care.  Provide for the patient's basic needs You should help the patient with basic needs in the home and provide support for getting groceries, prescriptions, and other personal needs.  Monitor the patient's symptoms If they are getting sicker, call his or her medical provider and tell them that the patient has, or is being evaluated for, COVID-19 infection. This will help the healthcare provider's office take steps to keep other people from getting infected. Ask the healthcare provider to call the local or state health department.  Limit the number of people who have contact with the patient  If possible, have only one caregiver for the patient.  Other household members should stay in another home or place of residence. If this is not possible, they should stay  in another room, or be separated from the patient as much as possible. Use a separate bathroom, if available.  Restrict visitors who do not have an essential need to be in the home.  Keep older adults, very young children, and other sick people away from the patient Keep older adults, very young children, and those who have compromised immune systems or chronic health conditions away from the patient. This includes people with chronic  heart, lung, or kidney conditions, diabetes, and cancer.  Ensure good ventilation Make sure that shared spaces in the home have good air flow, such as from an air conditioner or an opened window, weather permitting.  Wash your hands often  Wash your hands often and thoroughly with soap and water for at least 20 seconds. You can use an alcohol based hand sanitizer if soap and water are not available and if your hands are not visibly dirty.  Avoid touching your eyes, nose, and mouth with unwashed hands.  Use disposable paper towels to dry your hands. If not available, use dedicated cloth towels and replace them when they become wet.  Wear a facemask and gloves  Wear a disposable facemask at all times in the room and gloves when you touch or have contact with the patient's blood, body fluids, and/or secretions or excretions, such as sweat, saliva, sputum, nasal mucus, vomit, urine, or feces.  Ensure the mask fits over your nose and mouth tightly, and do not touch it during use.  Throw out disposable facemasks and gloves after using them. Do not reuse.  Wash your hands immediately after removing your facemask and gloves.  If your personal clothing becomes contaminated, carefully remove clothing and launder. Wash your hands after handling contaminated clothing.  Place all  used disposable facemasks, gloves, and other waste in a lined container before disposing them with other household waste.  Remove gloves and wash your hands immediately after handling these items.  Do not share dishes, glasses, or other household items with the patient  Avoid sharing household items. You should not share dishes, drinking glasses, cups, eating utensils, towels, bedding, or other items with a patient who is confirmed to have, or being evaluated for, COVID-19 infection.  After the person uses these items, you should wash them thoroughly with soap and water.  Wash laundry thoroughly  Immediately remove  and wash clothes or bedding that have blood, body fluids, and/or secretions or excretions, such as sweat, saliva, sputum, nasal mucus, vomit, urine, or feces, on them.  Wear gloves when handling laundry from the patient.  Read and follow directions on labels of laundry or clothing items and detergent. In general, wash and dry with the warmest temperatures recommended on the label.  Clean all areas the individual has used often  Clean all touchable surfaces, such as counters, tabletops, doorknobs, bathroom fixtures, toilets, phones, keyboards, tablets, and bedside tables, every day. Also, clean any surfaces that may have blood, body fluids, and/or secretions or excretions on them.  Wear gloves when cleaning surfaces the patient has come in contact with.  Use a diluted bleach solution (e.g., dilute bleach with 1 part bleach and 10 parts water) or a household disinfectant with a label that says EPA-registered for coronaviruses. To make a bleach solution at home, add 1 tablespoon of bleach to 1 quart (4 cups) of water. For a larger supply, add  cup of bleach to 1 gallon (16 cups) of water.  Read labels of cleaning products and follow recommendations provided on product labels. Labels contain instructions for safe and effective use of the cleaning product including precautions you should take when applying the product, such as wearing gloves or eye protection and making sure you have good ventilation during use of the product.  Remove gloves and wash hands immediately after cleaning.  Monitor yourself for signs and symptoms of illness Caregivers and household members are considered close contacts, should monitor their health, and will be asked to limit movement outside of the home to the extent possible. Follow the monitoring steps for close contacts listed on the symptom monitoring form.   ? If you have additional questions, contact your local health department or call the epidemiologist on call  at [phone] (available 24/7). ? This guidance is subject to change. For the most up-to-date guidance from [facility], please refer to their website: [email or web address]

## 2020-04-03 ENCOUNTER — Other Ambulatory Visit: Payer: Self-pay

## 2020-04-03 ENCOUNTER — Encounter (HOSPITAL_BASED_OUTPATIENT_CLINIC_OR_DEPARTMENT_OTHER): Payer: Self-pay | Admitting: Emergency Medicine

## 2020-04-03 ENCOUNTER — Emergency Department (HOSPITAL_BASED_OUTPATIENT_CLINIC_OR_DEPARTMENT_OTHER): Payer: Medicare HMO

## 2020-04-03 ENCOUNTER — Emergency Department (HOSPITAL_BASED_OUTPATIENT_CLINIC_OR_DEPARTMENT_OTHER)
Admission: EM | Admit: 2020-04-03 | Discharge: 2020-04-03 | Disposition: A | Discharge status: Home / Self Care | Payer: Medicare HMO | Attending: Emergency Medicine | Admitting: Emergency Medicine

## 2020-04-03 DIAGNOSIS — M79671 Pain in right foot: Secondary | ICD-10-CM | POA: Diagnosis not present

## 2020-04-03 DIAGNOSIS — I10 Essential (primary) hypertension: Secondary | ICD-10-CM | POA: Diagnosis not present

## 2020-04-03 DIAGNOSIS — Z87891 Personal history of nicotine dependence: Secondary | ICD-10-CM | POA: Diagnosis not present

## 2020-04-03 DIAGNOSIS — Z7982 Long term (current) use of aspirin: Secondary | ICD-10-CM | POA: Insufficient documentation

## 2020-04-03 DIAGNOSIS — Z79899 Other long term (current) drug therapy: Secondary | ICD-10-CM | POA: Diagnosis not present

## 2020-04-03 DIAGNOSIS — Z8551 Personal history of malignant neoplasm of bladder: Secondary | ICD-10-CM | POA: Insufficient documentation

## 2020-04-03 DIAGNOSIS — Z7901 Long term (current) use of anticoagulants: Secondary | ICD-10-CM | POA: Diagnosis not present

## 2020-04-03 DIAGNOSIS — J449 Chronic obstructive pulmonary disease, unspecified: Secondary | ICD-10-CM | POA: Diagnosis not present

## 2020-04-03 LAB — BASIC METABOLIC PANEL
Anion gap: 10 (ref 5–15)
BUN: 10 mg/dL (ref 8–23)
CO2: 22 mmol/L (ref 22–32)
Calcium: 8.9 mg/dL (ref 8.9–10.3)
Chloride: 102 mmol/L (ref 98–111)
Creatinine, Ser: 1.11 mg/dL (ref 0.61–1.24)
GFR calc Af Amer: >60 mL/min (ref >60)
GFR calc non Af Amer: >60 mL/min (ref >60)
Glucose, Bld: 97 mg/dL (ref 70–99)
Potassium: 3.9 mmol/L (ref 3.5–5.1)
Sodium: 134 mmol/L — ABNORMAL LOW (ref 135–145)

## 2020-04-03 LAB — CBC WITH DIFFERENTIAL/PLATELET
Abs Immature Granulocytes: 0.03 10*3/uL (ref 0.00–0.07)
Basophils Absolute: 0 10*3/uL (ref 0.0–0.1)
Basophils Relative: 1 %
Eosinophils Absolute: 0.1 10*3/uL (ref 0.0–0.5)
Eosinophils Relative: 2 %
HCT: 35.5 % — ABNORMAL LOW (ref 39.0–52.0)
Hemoglobin: 11.7 g/dL — ABNORMAL LOW (ref 13.0–17.0)
Immature Granulocytes: 1 %
Lymphocytes Relative: 26 %
Lymphs Abs: 1.2 10*3/uL (ref 0.7–4.0)
MCH: 29 pg (ref 26.0–34.0)
MCHC: 33 g/dL (ref 30.0–36.0)
MCV: 88.1 fL (ref 80.0–100.0)
Monocytes Absolute: 0.7 10*3/uL (ref 0.1–1.0)
Monocytes Relative: 14 %
Neutro Abs: 2.7 10*3/uL (ref 1.7–7.7)
Neutrophils Relative %: 56 %
Platelets: 237 10*3/uL (ref 150–400)
RBC: 4.03 MIL/uL — ABNORMAL LOW (ref 4.22–5.81)
RDW: 15.2 % (ref 11.5–15.5)
WBC: 4.9 10*3/uL (ref 4.0–10.5)
nRBC: 0 % (ref 0.0–0.2)

## 2020-04-03 MED ORDER — COLCHICINE 0.6 MG PO TABS: 0.6000 mg | ORAL_TABLET | Freq: Every day | ORAL | 0 refills | Status: DC

## 2020-04-03 MED ORDER — DOXYCYCLINE HYCLATE 100 MG PO CAPS: 100.0000 mg | ORAL_CAPSULE | Freq: Two times a day (BID) | ORAL | 0 refills | Status: AC

## 2020-04-03 NOTE — ED Provider Notes (Signed)
[facility] Provider Note   CSN: [id number] Arrival date & time: 04/03/20  1757     History Chief Complaint  Patient presents with  . Foot Pain    DONTAE Cooper is a 79 y.o. male history of bladder cancer, COPD, hypertension, PE on warfarin.  Patient presents today for pain swelling erythema of the right foot onset 2 days ago denies any injury to the area.  Describes severe constant sharp pain of the right first and second MTP, denies similar pain in the past, pain does not radiate worsens with movement and palpation, improved with rest.  Denies fall/injury, fever/chills, abdominal pain, nausea/vomiting, dysuria/hematuria, numbness/weakness or any additional concerns.  Patient's daughter at bedside provided supplemental history and corroborated patient story.  HPI     Past Medical History:  Diagnosis Date  . Bladder cancer ([place])   . COPD (chronic obstructive pulmonary disease) ([place])   . High cholesterol   . Hypertension   . Pulmonary embolism [facility])     Patient Active Problem List   Diagnosis Date Noted  . Hypovolemia due to dehydration 03/17/2020  . COVID-19 virus infection 03/17/2020  . Hypertension   . High cholesterol   . COPD (chronic obstructive pulmonary disease) ([place])   . AKI (acute kidney injury) ([place])   . Leukopenia   . Elevated liver enzymes   . Pulmonary embolism ([place])   . Bladder cancer ([place])   . Asymptomatic bacteriuria   . Sepsis due to COVID-19 [facility]) 03/06/2020    Past Surgical History:  Procedure Laterality Date  . BLADDER SURGERY         No family history on file.  Social History   Tobacco Use  . Smoking status: Former Research scientist (life sciences)  . Smokeless tobacco: Never Used  Vaping Use  . Vaping Use: Never used  Substance Use Topics  . Alcohol use: No  . Drug use: Never    Home Medications Prior to Admission medications   Medication Sig Start Date End Date Taking? Authorizing Provider  acetaminophen (TYLENOL)  500 MG tablet Take 1,000 mg by mouth every 6 (six) hours as needed for fever.    [provider]  acetaminophen (TYLENOL) 650 MG CR tablet Take 1,300 mg by mouth daily as needed for pain (arthritis).    [provider]  albuterol (VENTOLIN HFA) 108 (90 Base) MCG/ACT inhaler Inhale 2 puffs into the lungs every 6 (six) hours. Patient taking differently: Inhale 2 puffs into the lungs every 6 (six) hours as needed for wheezing or shortness of breath.  03/09/20   Amin, Jeanella Flattery, MD  amLODipine (NORVASC) 10 MG tablet Take 1 tablet (10 mg total) by mouth daily. 03/24/20   Thurnell Lose, MD  ascorbic acid (VITAMIN C) 500 MG tablet Take 1 tablet (500 mg total) by mouth daily. 03/10/20   Amin, Jeanella Flattery, MD  aspirin EC 81 MG tablet Take 81 mg by mouth daily.     [provider]  colchicine 0.6 MG tablet Take 1 tablet (0.6 mg total) by mouth daily. 04/03/20   Nuala Alpha A, PA-C  doxycycline (VIBRAMYCIN) 100 MG capsule Take 1 capsule (100 mg total) by mouth 2 (two) times daily for 7 days. 04/03/20 04/10/20  Nuala Alpha A, PA-C  isosorbide mononitrate (IMDUR) 30 MG 24 hr tablet Take 1 tablet (30 mg total) by mouth daily. 03/23/20   Thurnell Lose, MD  Menthol, Topical Analgesic, (MENTHOL EX) Apply 1 application topically daily as needed (knee pain).  [provider]  pantoprazole (PROTONIX) 40 MG tablet Take 1 tablet (40 mg total) by mouth daily. 03/24/20   Thurnell Lose, MD  rosuvastatin (CRESTOR) 20 MG tablet Take 20 mg by mouth daily.  09/26/17   [provider]  Tiotropium Bromide-Olodaterol (STIOLTO RESPIMAT) 2.5-2.5 MCG/ACT AERS Inhale 1 puff into the lungs daily.    [provider]  warfarin (COUMADIN) 5 MG tablet Take 5-7.5 mg by mouth See admin instructions. Take 1 1/2 tablets (7.5 mg) on Monday, Wednesday, Friday mornings, take 1 tablet (5 mg) on Sunday, Tuesday, Thursday, Saturday mornings 12/19/19   [provider]  zinc  sulfate 220 (50 Zn) MG capsule Take 1 capsule (220 mg total) by mouth daily. 03/10/20   Damita Lack, MD    Allergies    Patient has no known allergies.  Review of Systems   Review of Systems Ten systems are reviewed and are negative for acute change except as noted in the HPI  Physical Exam Updated Vital Signs BP 113/68 (BP Location: Left Arm)   Pulse 90   Temp 98.1 F (36.7 C) (Oral)   Resp 18   Ht 5\' 8"  (1.727 m)   Wt 93.9 kg   SpO2 95%   BMI 31.47 kg/m   Physical Exam Constitutional:      General: He is not in acute distress.    Appearance: Normal appearance. He is well-developed. He is not ill-appearing or diaphoretic.  HENT:     Head: Normocephalic and atraumatic.  Eyes:     General: Vision grossly intact. Gaze aligned appropriately.     Pupils: Pupils are equal, round, and reactive to light.  Neck:     Trachea: Trachea and phonation normal.  Cardiovascular:     Pulses:          Dorsalis pedis pulses are 2+ on the right side and 2+ on the left side.       Posterior tibial pulses are 2+ on the right side and 2+ on the left side.  Pulmonary:     Effort: Pulmonary effort is normal. No respiratory distress.  Abdominal:     General: There is no distension.     Palpations: Abdomen is soft.     Tenderness: There is no abdominal tenderness. There is no guarding or rebound.  Musculoskeletal:        General: Normal range of motion.     Cervical back: Normal range of motion.       Feet:  Feet:     Right foot:     Protective Sensation: 5 sites tested. 5 sites sensed.     Skin integrity: Skin integrity normal.     Left foot:     Protective Sensation: 5 sites tested. 5 sites sensed.     Skin integrity: Skin integrity normal.     Comments: Swelling, erythema and increased warmth along the dorsal aspect of the right foot extending from the first MTP across to the third MTP, pain appears concentrated around the first MTP.  No skin breaks present.  Equal pedal pulses.   Capillary refill and sensation intact to toes.  Appropriate range of motion at the knee and ankle for age without pain.  Increased pain with movement at the toes. Skin:    General: Skin is warm and dry.  Neurological:     Mental Status: He is alert.     GCS: GCS eye subscore is 4. GCS verbal subscore is 5. GCS motor subscore is  6.     Comments: Speech is clear and goal oriented, follows commands Major Cranial nerves without deficit, no facial droop Moves extremities without ataxia, coordination intact  Psychiatric:        Behavior: Behavior normal.     ED Results / Procedures / Treatments   Labs (all labs ordered are listed, but only abnormal results are displayed) Labs Reviewed  CBC WITH DIFFERENTIAL/PLATELET - Abnormal; Notable for the following components:      Result Value   RBC 4.03 (*)    Hemoglobin 11.7 (*)    HCT 35.5 (*)    All other components within normal limits  BASIC METABOLIC PANEL - Abnormal; Notable for the following components:   Sodium 134 (*)    All other components within normal limits    EKG None  Radiology DG Foot Complete Right  Result Date: 04/03/2020 CLINICAL DATA:  Right foot pain, swelling, erythema and warmth along the first and second MTP. EXAM: RIGHT FOOT COMPLETE - 3+ VIEW COMPARISON:  None. FINDINGS: Advanced osteoarthritis of the first metatarsal phalangeal joint. Minor degenerative change in the midfoot. There is a plantar calcaneal spur and Achilles tendon enthesophyte. No periosteal reaction, erosion, or bony destruction. Generalized soft tissue edema overlies the dorsum of the metatarsals. No soft tissue air or radiopaque foreign body. IMPRESSION: 1. Generalized soft tissue edema overlies the dorsum of the metatarsals. No acute osseous abnormality. 2. Advanced osteoarthritis of the first metatarsophalangeal joint. 3. Plantar calcaneal spur and Achilles tendon enthesophyte. Electronically Signed   By: Keith Rake M.D.   On: 04/03/2020 22:29     Procedures Procedures (including critical care time)  Medications Ordered in ED Medications - No data to display  ED Course  I have reviewed the triage vital signs and the nursing notes.  Pertinent labs & imaging results that were available during my care of the patient were reviewed by me and considered in my medical decision making (see chart for details).  Clinical Course as of Apr 03 2249  Fri Apr 03, 1028  [id number] 79 year old male brought in by his daughter for evaluation of right foot pain.  Like might have been going on a few days patient's not sure.  Tender at first MTP although also has some warmth and erythema over the top of his foot.  Probably gout but should cover for antibiotics just in case.  Return instructions discussed.   [MB]    Clinical Course User Index [MB] Hayden Rasmussen, MD   MDM Rules/Calculators/A&P                         Additional history obtained from: 1. Nursing notes from this visit. 2. Family at bedside. - CBC without leukocytosis, anemia of 11.7 appears baseline. BMP shows hyponatremia at 134 otherwise within normal limits, no AKI, emergent electrolyte abnormalities or gap. Patient's daughter reports PT/INR was drawn earlier today and was 3.7.  They were instructed to hold Coumadin today by PCP.  DG Right Foot:  IMPRESSION:  1. Generalized soft tissue edema overlies the dorsum of the  metatarsals. No acute osseous abnormality.  2. Advanced osteoarthritis of the first metatarsophalangeal joint.  3. Plantar calcaneal spur and Achilles tendon enthesophyte.  - Examination was suggestive of gout however the erythema and swelling extends laterally to the third MTP.  Will start patient on colchicine for gout however feel patient may benefit from a course of antibiotics, will prescribe doxycycline 100 mg twice daily.  Patient encouraged to follow-up closely with his PCP and schedule appointment at the beginning of this week for recheck.  No  leukocytosis, fever tachycardia or hypotension.  Patient does not meet SIRS criteria no indication for admission or IV antibiotics.  Potential adverse medication reactions discussed with patient and his daughter and they state understanding and will follow-up with PCP, discussed with Dr. Melina Copa who agrees.  At this time there does not appear to be any evidence of an acute emergency medical condition and the patient appears stable for discharge with appropriate outpatient follow up. Diagnosis was discussed with patient who verbalizes understanding of care plan and is agreeable to discharge. I have discussed return precautions with patient who verbalizes understanding. Patient encouraged to follow-up with their PCP. All questions answered.  Patient seen and evaluated by Dr. Melina Copa who agrees with discharge of colchicine and doxycycline.  Note: Portions of this report may have been transcribed using voice recognition software. Every effort was made to ensure accuracy; however, inadvertent computerized transcription errors may still be present. Final Clinical Impression(s) / ED Diagnoses Final diagnoses:  Foot pain, right    Rx / DC Orders ED Discharge Orders         Ordered    colchicine 0.6 MG tablet  Daily        04/03/20 2248    doxycycline (VIBRAMYCIN) 100 MG capsule  2 times daily        04/03/20 2248           Gari Crown 04/03/20 2251    Hayden Rasmussen, MD 04/04/20 1133

## 2020-04-03 NOTE — Discharge Instructions (Addendum)
At this time there does not appear to be the presence of an emergent medical condition, however there is always the potential for conditions to change. Please read and follow the below instructions.  Please return to the Emergency Department immediately for any new or worsening symptoms or if your symptoms do not improve within 2 days. Please see your primary care doctor for recheck of your foot in the next 2-3 days.  If your symptoms worsen at any time return to the emergency department immediately. Please take your antibiotic Doxycycline as prescribed until complete to help with your symptoms.  Please drink enough water to avoid dehydration and get plenty of rest. You may begin taking the medication colchicine as prescribed to help if your symptoms are related to gout.  This medication may cause you to have more muscle aches when taken with your cholesterol medication your atorvastatin, if you develop muscle aches while taking please stop the medication and follow-up with your primary care provider.  Go to the nearest Emergency Department immediately if: You have fever or chills Your foot is numb or tingling. Your foot or toes are swollen. Your foot or toes turn white or blue. You have warmth and redness along your foot. You have any new/concerning or worsening of symptoms  Please read the additional information packets attached to your discharge summary.  Do not take your medicine if  develop an itchy rash, swelling in your mouth or lips, or difficulty breathing; call 911 and seek immediate emergency medical attention if this occurs.  You may review your lab tests and imaging results in their entirety on your MyChart account.  Please discuss all results of fully with your primary care provider and other specialist at your follow-up visit.  Note: Portions of this text may have been transcribed using voice recognition software. Every effort was made to ensure accuracy; however, inadvertent  computerized transcription errors may still be present.

## 2020-04-03 NOTE — ED Triage Notes (Signed)
Reports right foot swelling and pain for the last couple of days.  Unsure of any injury.

## 2020-07-08 ENCOUNTER — Other Ambulatory Visit: Payer: Self-pay

## 2020-07-09 ENCOUNTER — Ambulatory Visit (INDEPENDENT_AMBULATORY_CARE_PROVIDER_SITE_OTHER): Payer: Medicare HMO | Admitting: Family Medicine

## 2020-07-09 ENCOUNTER — Encounter: Payer: Self-pay | Admitting: Family Medicine

## 2020-07-09 VITALS — BP 112/68 | HR 74 | Temp 98.5°F | Resp 16 | Ht 70.5 in | Wt 218.8 lb

## 2020-07-09 DIAGNOSIS — E669 Obesity, unspecified: Secondary | ICD-10-CM | POA: Diagnosis not present

## 2020-07-09 DIAGNOSIS — M5137 Other intervertebral disc degeneration, lumbosacral region: Secondary | ICD-10-CM | POA: Diagnosis not present

## 2020-07-09 DIAGNOSIS — I10 Essential (primary) hypertension: Secondary | ICD-10-CM

## 2020-07-09 DIAGNOSIS — D649 Anemia, unspecified: Secondary | ICD-10-CM

## 2020-07-09 DIAGNOSIS — M1990 Unspecified osteoarthritis, unspecified site: Secondary | ICD-10-CM | POA: Insufficient documentation

## 2020-07-09 DIAGNOSIS — E7800 Pure hypercholesterolemia, unspecified: Secondary | ICD-10-CM

## 2020-07-09 DIAGNOSIS — I2699 Other pulmonary embolism without acute cor pulmonale: Secondary | ICD-10-CM

## 2020-07-09 DIAGNOSIS — M51379 Other intervertebral disc degeneration, lumbosacral region without mention of lumbar back pain or lower extremity pain: Secondary | ICD-10-CM

## 2020-07-09 DIAGNOSIS — M069 Rheumatoid arthritis, unspecified: Secondary | ICD-10-CM | POA: Insufficient documentation

## 2020-07-09 DIAGNOSIS — M25561 Pain in right knee: Secondary | ICD-10-CM

## 2020-07-09 DIAGNOSIS — C679 Malignant neoplasm of bladder, unspecified: Secondary | ICD-10-CM

## 2020-07-09 DIAGNOSIS — Z8616 Personal history of COVID-19: Secondary | ICD-10-CM

## 2020-07-09 DIAGNOSIS — H9190 Unspecified hearing loss, unspecified ear: Secondary | ICD-10-CM

## 2020-07-09 MED ORDER — OLMESARTAN MEDOXOMIL-HCTZ 40-25 MG PO TABS: 1.0000 | ORAL_TABLET | Freq: Every day | ORAL | 1 refills | Status: DC

## 2020-07-09 NOTE — Patient Instructions (Addendum)
Shingrix is the new shingles shot 2 shots over 2-6 months at the pharmacy, pharmacist can let you know your out of pocket cost.   The pharmacy at the [facility] does the covid shots  [facility]   Hypertension, Adult High blood pressure (hypertension) is when the force of blood pumping through the arteries is too strong. The arteries are the blood vessels that carry blood from the heart throughout the body. Hypertension forces the heart to work harder to pump blood and may cause arteries to become narrow or stiff. Untreated or uncontrolled hypertension can cause a heart attack, heart failure, a stroke, kidney disease, and other problems. A blood pressure reading consists of a higher number over a lower number. Ideally, your blood pressure should be below 120/80. The first ("top") number is called the systolic pressure. It is a measure of the pressure in your arteries as your heart beats. The second ("bottom") number is called the diastolic pressure. It is a measure of the pressure in your arteries as the heart relaxes. What are the causes? The exact cause of this condition is not known. There are some conditions that result in or are related to high blood pressure. What increases the risk? Some risk factors for high blood pressure are under your control. The following factors may make you more likely to develop this condition:  Smoking.  Having type 2 diabetes mellitus, high cholesterol, or both.  Not getting enough exercise or physical activity.  Being overweight.  Having too much fat, sugar, calories, or salt (sodium) in your diet.  Drinking too much alcohol. Some risk factors for high blood pressure may be difficult or impossible to change. Some of these factors include:  Having chronic kidney disease.  Having a family history of high blood pressure.  Age. Risk increases with age.  Race. You may be at higher risk if you are African American.  Gender. Men are at  higher risk than women before age 70. After age 78, women are at higher risk than men.  Having obstructive sleep apnea.  Stress. What are the signs or symptoms? High blood pressure may not cause symptoms. Very high blood pressure (hypertensive crisis) may cause:  Headache.  Anxiety.  Shortness of breath.  Nosebleed.  Nausea and vomiting.  Vision changes.  Severe chest pain.  Seizures. How is this diagnosed? This condition is diagnosed by measuring your blood pressure while you are seated, with your arm resting on a flat surface, your legs uncrossed, and your feet flat on the floor. The cuff of the blood pressure monitor will be placed directly against the skin of your upper arm at the level of your heart. It should be measured at least twice using the same arm. Certain conditions can cause a difference in blood pressure between your right and left arms. Certain factors can cause blood pressure readings to be lower or higher than normal for a short period of time:  When your blood pressure is higher when you are in a health care provider's office than when you are at home, this is called white coat hypertension. Most people with this condition do not need medicines.  When your blood pressure is higher at home than when you are in a health care provider's office, this is called masked hypertension. Most people with this condition may need medicines to control blood pressure. If you have a high blood pressure reading during one visit or you have normal blood pressure with other risk factors,  you may be asked to:  Return on a different day to have your blood pressure checked again.  Monitor your blood pressure at home for 1 week or longer. If you are diagnosed with hypertension, you may have other blood or imaging tests to help your health care provider understand your overall risk for other conditions. How is this treated? This condition is treated by making healthy lifestyle  changes, such as eating healthy foods, exercising more, and reducing your alcohol intake. Your health care provider may prescribe medicine if lifestyle changes are not enough to get your blood pressure under control, and if:  Your systolic blood pressure is above 130.  Your diastolic blood pressure is above 80. Your personal target blood pressure may vary depending on your medical conditions, your age, and other factors. Follow these instructions at home: Eating and drinking   Eat a diet that is high in fiber and potassium, and low in sodium, added sugar, and fat. An example eating plan is called the DASH (Dietary Approaches to Stop Hypertension) diet. To eat this way: ? Eat plenty of fresh fruits and vegetables. Try to fill one half of your plate at each meal with fruits and vegetables. ? Eat whole grains, such as whole-wheat pasta, brown rice, or whole-grain bread. Fill about one fourth of your plate with whole grains. ? Eat or drink low-fat dairy products, such as skim milk or low-fat yogurt. ? Avoid fatty cuts of meat, processed or cured meats, and poultry with skin. Fill about one fourth of your plate with lean proteins, such as fish, chicken without skin, beans, eggs, or tofu. ? Avoid pre-made and processed foods. These tend to be higher in sodium, added sugar, and fat.  Reduce your daily sodium intake. Most people with hypertension should eat less than 1,500 mg of sodium a day.  Do not drink alcohol if: ? Your health care provider tells you not to drink. ? You are pregnant, may be pregnant, or are planning to become pregnant.  If you drink alcohol: ? Limit how much you use to:  0-1 drink a day for women.  0-2 drinks a day for men. ? Be aware of how much alcohol is in your drink. In the [place] one drink equals one 12 oz bottle of beer (355 mL), one 5 oz glass of wine (148 mL), or one 1 oz glass of hard liquor (44 mL). Lifestyle   Work with your health care provider to maintain  a healthy body weight or to lose weight. Ask what an ideal weight is for you.  Get at least 30 minutes of exercise most days of the week. Activities may include walking, swimming, or biking.  Include exercise to strengthen your muscles (resistance exercise), such as Pilates or lifting weights, as part of your weekly exercise routine. Try to do these types of exercises for 30 minutes at least 3 days a week.  Do not use any products that contain nicotine or tobacco, such as cigarettes, e-cigarettes, and chewing tobacco. If you need help quitting, ask your health care provider.  Monitor your blood pressure at home as told by your health care provider.  Keep all follow-up visits as told by your health care provider. This is important. Medicines  Take over-the-counter and prescription medicines only as told by your health care provider. Follow directions carefully. Blood pressure medicines must be taken as prescribed.  Do not skip doses of blood pressure medicine. Doing this puts you at risk for problems and  can make the medicine less effective.  Ask your health care provider about side effects or reactions to medicines that you should watch for. Contact a health care provider if you:  Think you are having a reaction to a medicine you are taking.  Have headaches that keep coming back (recurring).  Feel dizzy.  Have swelling in your ankles.  Have trouble with your vision. Get help right away if you:  Develop a severe headache or confusion.  Have unusual weakness or numbness.  Feel faint.  Have severe pain in your chest or abdomen.  Vomit repeatedly.  Have trouble breathing. Summary  Hypertension is when the force of blood pumping through your arteries is too strong. If this condition is not controlled, it may put you at risk for serious complications.  Your personal target blood pressure may vary depending on your medical conditions, your age, and other factors. For most  people, a normal blood pressure is less than 120/80.  Hypertension is treated with lifestyle changes, medicines, or a combination of both. Lifestyle changes include losing weight, eating a healthy, low-sodium diet, exercising more, and limiting alcohol. This information is not intended to replace advice given to you by your health care provider. Make sure you discuss any questions you have with your health care provider. Document Revised: 02/28/2018 Document Reviewed: 02/28/2018 Elsevier Patient Education  2020 [employer].

## 2020-07-09 NOTE — Assessment & Plan Note (Signed)
Referred to rheumatology

## 2020-07-09 NOTE — Assessment & Plan Note (Addendum)
He is not tolerating his Amlodipine secondary to edema, will restart his Olmesartanhct 40/25 daily and monitor

## 2020-07-12 DIAGNOSIS — D649 Anemia, unspecified: Secondary | ICD-10-CM | POA: Insufficient documentation

## 2020-07-12 DIAGNOSIS — H9190 Unspecified hearing loss, unspecified ear: Secondary | ICD-10-CM | POA: Insufficient documentation

## 2020-07-12 NOTE — Assessment & Plan Note (Signed)
Likely secondary to his sturggles with acute anemia. Will monitor. Increase leafy greens, consider increased lean red meat and using cast iron cookware. Continue to monitor, report any concerns

## 2020-07-12 NOTE — Assessment & Plan Note (Signed)
Tolerating statin, encouraged heart healthy diet, avoid trans fats, minimize simple carbs and saturated fats. Increase exercise as tolerated 
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
## 2020-07-12 NOTE — Assessment & Plan Note (Addendum)
Asymptomatic and tolerating Coumadin, he follows with pulmonology who monitor his Coumadin. His hypoxia is much better and he would like to give back his oxygen takes except for one. He is asked to have a pulse oximeter at home and they will let us know what company supplied his tanks so we can call them and have them removed.

## 2020-07-12 NOTE — Assessment & Plan Note (Signed)
He lost hearing in his left ear as a child due to mastoiditis but is now loosing hearing in right ear. They are encouraged to try getting testing done at [facility] as they had trouble considering the cost

## 2020-07-12 NOTE — Assessment & Plan Note (Signed)
He has surgery soon. Works with Dr Amalia Hailey

## 2020-07-12 NOTE — Assessment & Plan Note (Signed)
He was severely ill and debilitating. At one point unable to ambulate but he is much better now and able to manage his ADLs with family help well

## 2020-08-12 ENCOUNTER — Encounter: Payer: Self-pay | Admitting: Family Medicine

## 2020-08-13 ENCOUNTER — Telehealth (INDEPENDENT_AMBULATORY_CARE_PROVIDER_SITE_OTHER): Payer: Medicare HMO | Admitting: Internal Medicine

## 2020-08-13 VITALS — HR 95 | Temp 97.0°F | Ht 70.5 in

## 2020-08-13 DIAGNOSIS — R509 Fever, unspecified: Secondary | ICD-10-CM | POA: Diagnosis not present

## 2020-08-13 NOTE — Telephone Encounter (Signed)
Pt scheduled w/ [facility] today.
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
## 2020-08-26 ENCOUNTER — Telehealth: Payer: Self-pay | Admitting: Family Medicine

## 2020-08-26 NOTE — Telephone Encounter (Signed)
Document faxed to our office to have provider fill out document (7 pages [place]) Document put at front office tray under providers name.

## 2020-08-26 NOTE — Telephone Encounter (Signed)
Form placed in yellow folder for review and completion

## 2020-09-01 NOTE — Telephone Encounter (Signed)
Paperwork is for daughter to take care of her father.    Updated daughter on paperwork that it has not been completed due to Charlett Blake being out of the office.  She stated that she think that it has a due date of 25 days of when it was sent.

## 2020-09-07 ENCOUNTER — Telehealth: Payer: Self-pay | Admitting: Family Medicine

## 2020-09-07 NOTE — Telephone Encounter (Signed)
Recvd records from [facility] forwarded 2 pages to Dr. Vivien Rossetti  3/7/22fbg

## 2020-09-08 ENCOUNTER — Other Ambulatory Visit: Payer: Self-pay | Admitting: *Deleted

## 2020-09-08 DIAGNOSIS — Z86711 Personal history of pulmonary embolism: Secondary | ICD-10-CM | POA: Diagnosis not present

## 2020-09-08 DIAGNOSIS — I82502 Chronic embolism and thrombosis of unspecified deep veins of left lower extremity: Secondary | ICD-10-CM | POA: Diagnosis not present

## 2020-09-08 MED ORDER — ROSUVASTATIN CALCIUM 20 MG PO TABS
20.0000 mg | ORAL_TABLET | Freq: Every day | ORAL | 1 refills | Status: DC
Start: 2020-09-08 — End: 2021-03-17

## 2020-09-08 NOTE — Telephone Encounter (Signed)
Called and spoke with daughter Eliot Ford.  I started asking the questions for Dr. Charlett Blake and apparently they have had this paperwork filled out at the office he went to before.  She is going to call that office (Dr. Gladis Riffle) and get the paperwork faxed here.  This will help will refilling this out.

## 2020-09-09 ENCOUNTER — Telehealth: Payer: Self-pay | Admitting: Family Medicine

## 2020-09-09 NOTE — Telephone Encounter (Signed)
Possible same form faxed in to be filled out  Put into b[place] folder

## 2020-09-09 NOTE — Telephone Encounter (Signed)
Duplicate message. 

## 2020-09-09 NOTE — Telephone Encounter (Signed)
Duplicate form

## 2020-09-09 NOTE — Telephone Encounter (Signed)
Document faxed to our office to be filled out by provider [facility] 6 pages [facility]) Document put at front office tray under providers name.

## 2020-09-11 ENCOUNTER — Encounter: Payer: Self-pay | Admitting: Family Medicine

## 2020-09-11 MED ORDER — STIOLTO RESPIMAT 2.5-2.5 MCG/ACT IN AERS: 1.0000 | INHALATION_SPRAY | Freq: Every day | RESPIRATORY_TRACT | 5 refills | Status: DC

## 2020-09-11 MED ORDER — TAMSULOSIN HCL 0.4 MG PO CAPS: 0.4000 mg | ORAL_CAPSULE | Freq: Every day | ORAL | 1 refills | Status: DC

## 2020-09-20 DIAGNOSIS — J449 Chronic obstructive pulmonary disease, unspecified: Secondary | ICD-10-CM | POA: Diagnosis not present

## 2020-10-06 DIAGNOSIS — I82502 Chronic embolism and thrombosis of unspecified deep veins of left lower extremity: Secondary | ICD-10-CM | POA: Diagnosis not present

## 2020-10-06 DIAGNOSIS — Z86711 Personal history of pulmonary embolism: Secondary | ICD-10-CM | POA: Diagnosis not present

## 2020-10-06 DIAGNOSIS — R791 Abnormal coagulation profile: Secondary | ICD-10-CM | POA: Diagnosis not present

## 2020-10-19 DIAGNOSIS — I82502 Chronic embolism and thrombosis of unspecified deep veins of left lower extremity: Secondary | ICD-10-CM | POA: Diagnosis not present

## 2020-10-19 DIAGNOSIS — Z86711 Personal history of pulmonary embolism: Secondary | ICD-10-CM | POA: Diagnosis not present

## 2020-10-20 ENCOUNTER — Encounter: Payer: Self-pay | Admitting: *Deleted

## 2020-10-21 DIAGNOSIS — J449 Chronic obstructive pulmonary disease, unspecified: Secondary | ICD-10-CM | POA: Diagnosis not present

## 2020-10-23 ENCOUNTER — Telehealth: Payer: Self-pay

## 2020-10-23 NOTE — Telephone Encounter (Signed)
Pt called back and pt is scheduled for pre-op appointment

## 2020-11-02 DIAGNOSIS — Z87438 Personal history of other diseases of male genital organs: Secondary | ICD-10-CM | POA: Diagnosis not present

## 2020-11-02 DIAGNOSIS — N3941 Urge incontinence: Secondary | ICD-10-CM | POA: Diagnosis not present

## 2020-11-02 DIAGNOSIS — Z8551 Personal history of malignant neoplasm of bladder: Secondary | ICD-10-CM | POA: Diagnosis not present

## 2020-11-03 ENCOUNTER — Other Ambulatory Visit: Payer: Self-pay

## 2020-11-03 ENCOUNTER — Ambulatory Visit (INDEPENDENT_AMBULATORY_CARE_PROVIDER_SITE_OTHER): Payer: Medicare HMO | Admitting: Family Medicine

## 2020-11-03 ENCOUNTER — Encounter: Payer: Self-pay | Admitting: Family Medicine

## 2020-11-03 VITALS — BP 122/70 | HR 72 | Temp 98.1°F | Resp 16 | Ht 71.0 in | Wt 227.6 lb

## 2020-11-03 DIAGNOSIS — G8929 Other chronic pain: Secondary | ICD-10-CM

## 2020-11-03 DIAGNOSIS — D649 Anemia, unspecified: Secondary | ICD-10-CM | POA: Diagnosis not present

## 2020-11-03 DIAGNOSIS — R9431 Abnormal electrocardiogram [ECG] [EKG]: Secondary | ICD-10-CM | POA: Diagnosis not present

## 2020-11-03 DIAGNOSIS — J449 Chronic obstructive pulmonary disease, unspecified: Secondary | ICD-10-CM | POA: Diagnosis not present

## 2020-11-03 DIAGNOSIS — Z01818 Encounter for other preprocedural examination: Secondary | ICD-10-CM

## 2020-11-03 DIAGNOSIS — R7982 Elevated C-reactive protein (CRP): Secondary | ICD-10-CM | POA: Diagnosis not present

## 2020-11-03 DIAGNOSIS — I10 Essential (primary) hypertension: Secondary | ICD-10-CM

## 2020-11-03 DIAGNOSIS — E669 Obesity, unspecified: Secondary | ICD-10-CM | POA: Diagnosis not present

## 2020-11-03 DIAGNOSIS — E7800 Pure hypercholesterolemia, unspecified: Secondary | ICD-10-CM | POA: Diagnosis not present

## 2020-11-03 DIAGNOSIS — I2699 Other pulmonary embolism without acute cor pulmonale: Secondary | ICD-10-CM

## 2020-11-03 DIAGNOSIS — M25561 Pain in right knee: Secondary | ICD-10-CM

## 2020-11-03 NOTE — Assessment & Plan Note (Signed)
No recent exacerbation. No changes 

## 2020-11-03 NOTE — Assessment & Plan Note (Addendum)
Follows with coumadin clinic at [place], Venia Minks and they will provide him with a Lovenox Bridge when it is time for surgery.

## 2020-11-03 NOTE — Assessment & Plan Note (Signed)
Increase leafy greens, consider increased lean red meat and using cast iron cookware. Continue to monitor, report any concerns 

## 2020-11-03 NOTE — Assessment & Plan Note (Signed)
Tolerating statin, encouraged heart healthy diet, avoid trans fats, minimize simple carbs and saturated fats. Increase exercise as tolerated 

## 2020-11-03 NOTE — Assessment & Plan Note (Signed)
Encouraged DASH or MIND diet, decrease po intake and increase exercise as tolerated. Needs 7-8 hours of sleep nightly. Avoid trans fats, eat small, frequent meals every 4-5 hours with lean proteins, complex carbs and healthy fats. Minimize simple carbs

## 2020-11-03 NOTE — Progress Notes (Signed)
Patient ID: Victor Cooper, male    DOB: Oct 27, 1940  Age: 80 y.o. MRN: [id number]    Subjective:  Subjective  HPI NATHENIAL BEACHAM presents for office visit today for pre-op for right knee surgery. He reports that he is feeling good and has no recent sicknesses or recent ER visits to report. He denies any chest pain, SOB, fever, abdominal pain, cough, chills, sore throat, dysuria, urinary incontinence, back pain, HA, or N/VD. He states that his eating habits are not the best, because he states that his specialist instructed him to cut down on the green vegetables, which he likes to eat due to his PE.    Review of Systems  Constitutional: Negative for chills, fatigue and fever.  HENT: Negative for congestion, rhinorrhea, sinus pressure, sinus pain and sore throat.   Eyes: Negative for pain.  Respiratory: Negative for cough and shortness of breath.   Cardiovascular: Negative for chest pain, palpitations and leg swelling.  Gastrointestinal: Negative for abdominal pain, blood in stool, diarrhea, nausea and vomiting.  Genitourinary: Negative for flank pain, frequency and penile pain.  Musculoskeletal: Negative for back pain.  Neurological: Negative for headaches.    History Past Medical History:  Diagnosis Date  . Anemia   . Bladder cancer ([place])   . COPD (chronic obstructive pulmonary disease) ([place])   . High cholesterol   . Hypertension   . Pulmonary embolism ([place])     He has a past surgical history that includes Bladder surgery; DG KNEE LEFT COMPLETE ([place] [facility] HX); and Craniotomy.   His family history includes Diabetes in his mother; Heart disease in his father; Hypertension in his father and mother; Vision loss in his mother.He reports that he has quit smoking. He has never used smokeless tobacco. He reports that he does not drink alcohol and does not use drugs.  Current Outpatient Medications on File Prior to Visit  Medication Sig Dispense Refill  . acetaminophen (TYLENOL) 500 MG tablet  Take 1,000 mg by mouth every 6 (six) hours as needed for fever.    Marland Kitchen acetaminophen (TYLENOL) 650 MG CR tablet Take 1,300 mg by mouth daily as needed for pain (arthritis).    Marland Kitchen albuterol (VENTOLIN HFA) 108 (90 Base) MCG/ACT inhaler Inhale 2 puffs into the lungs every 6 (six) hours. (Patient taking differently: Inhale 2 puffs into the lungs every 6 (six) hours as needed for wheezing or shortness of breath.) 8 g 0  . ascorbic acid (VITAMIN C) 500 MG tablet Take 1 tablet (500 mg total) by mouth daily. 7 tablet 0  . aspirin EC 81 MG tablet Take 81 mg by mouth daily.     . colchicine 0.6 MG tablet Take 1 tablet (0.6 mg total) by mouth daily. 7 tablet 0  . isosorbide mononitrate (IMDUR) 30 MG 24 hr tablet Take 1 tablet (30 mg total) by mouth daily. 30 tablet 0  . Menthol, Topical Analgesic, (MENTHOL EX) Apply 1 application topically daily as needed (knee pain).    Marland Kitchen olmesartan-hydrochlorothiazide (BENICAR HCT) 40-25 MG tablet Take 1 tablet by mouth daily. 90 tablet 1  . pantoprazole (PROTONIX) 40 MG tablet Take 1 tablet (40 mg total) by mouth daily. 30 tablet 0  . rosuvastatin (CRESTOR) 20 MG tablet Take 1 tablet (20 mg total) by mouth daily. 90 tablet 1  . tamsulosin (FLOMAX) 0.4 MG CAPS capsule Take 1 capsule (0.4 mg total) by mouth at bedtime. 90 capsule 1  . Tiotropium Bromide-Olodaterol (STIOLTO RESPIMAT) 2.5-2.5 MCG/ACT AERS Inhale 1 puff  into the lungs daily. 4 g 5  . warfarin (COUMADIN) 5 MG tablet Take 5-7.5 mg by mouth See admin instructions. Take 1 1/2 tablets (7.5 mg) on Monday, Wednesday, Friday mornings, take 1 tablet (5 mg) on Sunday, Tuesday, Thursday, Saturday mornings    . zinc sulfate 220 (50 Zn) MG capsule Take 1 capsule (220 mg total) by mouth daily. 7 capsule 0   No current facility-administered medications on file prior to visit.     Objective:  Objective  Physical Exam Constitutional:      General: He is not in acute distress.    Appearance: Normal appearance. He is not  ill-appearing or toxic-appearing.  HENT:     Head: Normocephalic and atraumatic.     Right Ear: Tympanic membrane, ear canal and external ear normal.     Left Ear: Tympanic membrane, ear canal and external ear normal.     Nose: No congestion or rhinorrhea.  Eyes:     Extraocular Movements: Extraocular movements intact.     Pupils: Pupils are equal, round, and reactive to light.  Cardiovascular:     Rate and Rhythm: Normal rate and regular rhythm.     Pulses: Normal pulses.     Heart sounds: Normal heart sounds. No murmur heard.   Pulmonary:     Effort: Pulmonary effort is normal. No respiratory distress.     Breath sounds: Normal breath sounds. No wheezing, rhonchi or rales.  Abdominal:     General: Bowel sounds are normal.     Palpations: Abdomen is soft. There is no mass.     Tenderness: There is no abdominal tenderness. There is no guarding.     Hernia: No hernia is present.  Musculoskeletal:        General: Normal range of motion.     Cervical back: Normal range of motion and neck supple.  Skin:    General: Skin is warm and dry.  Neurological:     Mental Status: He is alert and oriented to person, place, and time.  Psychiatric:        Behavior: Behavior normal.    BP 122/70   Pulse 72   Temp 98.1 F (36.7 C)   Resp 16   Ht 5\' 11"  (1.803 m)   Wt 227 lb 9.6 oz (103.2 kg)   SpO2 98%   BMI 31.74 kg/m  Wt Readings from Last 3 Encounters:  11/03/20 227 lb 9.6 oz (103.2 kg)  07/09/20 218 lb 12.8 oz (99.2 kg)  04/03/20 207 lb (93.9 kg)     Lab Results  Component Value Date   WBC 4.9 04/03/2020   HGB 11.7 (L) 04/03/2020   HCT 35.5 (L) 04/03/2020   PLT 237 04/03/2020   GLUCOSE 97 04/03/2020   ALT 74 (H) 03/23/2020   AST 41 03/23/2020   NA 134 (L) 04/03/2020   K 3.9 04/03/2020   CL 102 04/03/2020   CREATININE 1.11 04/03/2020   BUN 10 04/03/2020   CO2 22 04/03/2020   INR 2.7 (H) 03/23/2020    DG Foot Complete Right  Result Date: 04/03/2020 CLINICAL DATA:   Right foot pain, swelling, erythema and warmth along the first and second MTP. EXAM: RIGHT FOOT COMPLETE - 3+ VIEW COMPARISON:  None. FINDINGS: Advanced osteoarthritis of the first metatarsal phalangeal joint. Minor degenerative change in the midfoot. There is a plantar calcaneal spur and Achilles tendon enthesophyte. No periosteal reaction, erosion, or bony destruction. Generalized soft tissue edema overlies the dorsum of the metatarsals.  No soft tissue air or radiopaque foreign body. IMPRESSION: 1. Generalized soft tissue edema overlies the dorsum of the metatarsals. No acute osseous abnormality. 2. Advanced osteoarthritis of the first metatarsophalangeal joint. 3. Plantar calcaneal spur and Achilles tendon enthesophyte. Electronically Signed   By: Keith Rake M.D.   On: 04/03/2020 22:29     Assessment & Plan:  Plan    No orders of the defined types were placed in this encounter.   Problem List Items Addressed This Visit    Hypertension    Well controlled, no changes to meds. Encouraged heart healthy diet such as the DASH diet and exercise as tolerated.       Relevant Orders   Comprehensive metabolic panel   CBC   Urinalysis   Hemoglobin A1c   Microalbumin / creatinine urine ratio   Protime-INR   C-reactive protein   Ambulatory referral to Cardiology   High cholesterol    Tolerating statin, encouraged heart healthy diet, avoid trans fats, minimize simple carbs and saturated fats. Increase exercise as tolerated      Relevant Orders   Comprehensive metabolic panel   CBC   Urinalysis   Hemoglobin A1c   Microalbumin / creatinine urine ratio   Protime-INR   C-reactive protein   Ambulatory referral to Cardiology   COPD (chronic obstructive pulmonary disease) ([place])    No recent exacerbation. No changes      Relevant Orders   Ambulatory referral to Cardiology   Pulmonary embolism [facility])    Follows with coumadin clinic at [place], [place]  and they will provide him with a Lovenox Bridge when it is time for surgery.      Obesity    Encouraged DASH or MIND diet, decrease po intake and increase exercise as tolerated. Needs 7-8 hours of sleep nightly. Avoid trans fats, eat small, frequent meals every 4-5 hours with lean proteins, complex carbs and healthy fats. Minimize simple carbs      Relevant Orders   Ambulatory referral to Cardiology   Anemia    Increase leafy greens, consider increased lean red meat and using cast iron cookware. Continue to monitor, report any concerns      Right knee pain    He is here today for pre op evaluation so that he can undergo a right total knee replacement. He is in daily pain and he is debilitated by his knee and so he is anxious to proceed with surgery. Labs are ordered today and provided labs are normal he is cleared medically pending cardiac clearance. EKG today showed sinus rhythm with first degree AV block and possibly LVH so given his risk factors he is referred for cardiac clearance.        Other Visit Diagnoses    Preop examination    -  Primary   Relevant Orders   Comprehensive metabolic panel   CBC   Urinalysis   Hemoglobin A1c   Microalbumin / creatinine urine ratio   Protime-INR   Ambulatory referral to Cardiology   CRP elevated       Relevant Orders   C-reactive protein   Abnormal EKG       Relevant Orders   Ambulatory referral to Cardiology      Follow-up: Return in about 6 months (around 05/06/2021).   I,David Hanna,acting as a scribe for Penni Homans, MD.,have documented all relevant documentation on the behalf of Penni Homans, MD,as directed by  Penni Homans, MD while in the presence of  Penni Homans, MD.  I, Mosie Lukes, MD personally performed the services described in this documentation. All medical record entries made by the scribe were at my direction and in my presence. I have reviewed the chart and agree that the record reflects my personal performance  and is accurate and complete

## 2020-11-03 NOTE — Assessment & Plan Note (Signed)
Well controlled, no changes to meds. Encouraged heart healthy diet such as the DASH diet and exercise as tolerated.  °

## 2020-11-03 NOTE — Assessment & Plan Note (Signed)
He is here today for pre op evaluation so that he can undergo a right total knee replacement. He is in daily pain and he is debilitated by his knee and so he is anxious to proceed with surgery. Labs are ordered today and provided labs are normal he is cleared medically pending cardiac clearance. EKG today showed sinus rhythm with first degree AV block and possibly LVH so given his risk factors he is referred for cardiac clearance.

## 2020-11-03 NOTE — Patient Instructions (Signed)

## 2020-11-04 LAB — URINALYSIS, ROUTINE W REFLEX MICROSCOPIC
Bilirubin Urine: NEGATIVE
Ketones, ur: NEGATIVE
Nitrite: NEGATIVE
Specific Gravity, Urine: 1.01 (ref 1.000–1.030)
Total Protein, Urine: NEGATIVE
Urine Glucose: NEGATIVE
Urobilinogen, UA: 0.2 (ref 0.0–1.0)
pH: 6 (ref 5.0–8.0)

## 2020-11-04 LAB — CBC
HCT: 38 % — ABNORMAL LOW (ref 39.0–52.0)
Hemoglobin: 12.4 g/dL — ABNORMAL LOW (ref 13.0–17.0)
MCHC: 32.6 g/dL (ref 30.0–36.0)
MCV: 79.1 fl (ref 78.0–100.0)
Platelets: 258 10*3/uL (ref 150.0–400.0)
RBC: 4.8 Mil/uL (ref 4.22–5.81)
RDW: 16.3 % — ABNORMAL HIGH (ref 11.5–15.5)
WBC: 7.3 10*3/uL (ref 4.0–10.5)

## 2020-11-04 LAB — HEMOGLOBIN A1C: Hgb A1c MFr Bld: 6.4 % (ref 4.6–6.5)

## 2020-11-04 LAB — COMPREHENSIVE METABOLIC PANEL
ALT: 24 U/L (ref 0–53)
AST: 31 U/L (ref 0–37)
Albumin: 4.4 g/dL (ref 3.5–5.2)
Alkaline Phosphatase: 52 U/L (ref 39–117)
BUN: 21 mg/dL (ref 6–23)
CO2: 28 mEq/L (ref 19–32)
Calcium: 9.9 mg/dL (ref 8.4–10.5)
Chloride: 103 mEq/L (ref 96–112)
Creatinine, Ser: 1.32 mg/dL (ref 0.40–1.50)
GFR: 51.84 mL/min — ABNORMAL LOW (ref >60.00)
Glucose, Bld: 90 mg/dL (ref 70–99)
Potassium: 4.2 mEq/L (ref 3.5–5.1)
Sodium: 138 mEq/L (ref 135–145)
Total Bilirubin: 0.6 mg/dL (ref 0.2–1.2)
Total Protein: 7.5 g/dL (ref 6.0–8.3)

## 2020-11-04 LAB — PROTIME-INR
INR: 2.9 ratio — ABNORMAL HIGH (ref 0.8–1.0)
Prothrombin Time: 32.1 s — ABNORMAL HIGH (ref 9.6–13.1)

## 2020-11-04 LAB — MICROALBUMIN / CREATININE URINE RATIO
Creatinine,U: 86.1 mg/dL
Microalb Creat Ratio: 4.1 mg/g (ref 0.0–30.0)
Microalb, Ur: 3.5 mg/dL — ABNORMAL HIGH (ref 0.0–1.9)

## 2020-11-04 LAB — C-REACTIVE PROTEIN: CRP: <1 mg/dL (ref 0.5–20.0)

## 2020-11-06 ENCOUNTER — Other Ambulatory Visit: Payer: Self-pay | Admitting: *Deleted

## 2020-11-06 DIAGNOSIS — R319 Hematuria, unspecified: Secondary | ICD-10-CM

## 2020-11-09 DIAGNOSIS — Z86711 Personal history of pulmonary embolism: Secondary | ICD-10-CM | POA: Diagnosis not present

## 2020-11-09 DIAGNOSIS — R791 Abnormal coagulation profile: Secondary | ICD-10-CM | POA: Diagnosis not present

## 2020-11-09 DIAGNOSIS — I82502 Chronic embolism and thrombosis of unspecified deep veins of left lower extremity: Secondary | ICD-10-CM | POA: Diagnosis not present

## 2020-11-13 ENCOUNTER — Other Ambulatory Visit: Payer: Self-pay

## 2020-11-13 ENCOUNTER — Other Ambulatory Visit (INDEPENDENT_AMBULATORY_CARE_PROVIDER_SITE_OTHER): Payer: Medicare HMO

## 2020-11-13 DIAGNOSIS — R319 Hematuria, unspecified: Secondary | ICD-10-CM | POA: Diagnosis not present

## 2020-11-15 ENCOUNTER — Other Ambulatory Visit: Payer: Self-pay | Admitting: Family Medicine

## 2020-11-15 LAB — URINE CULTURE
MICRO NUMBER:: 11888029
SPECIMEN QUALITY:: ADEQUATE

## 2020-11-15 MED ORDER — SULFAMETHOXAZOLE-TRIMETHOPRIM 800-160 MG PO TABS: 1.0000 | ORAL_TABLET | Freq: Two times a day (BID) | ORAL | 0 refills | Status: DC

## 2020-11-18 DIAGNOSIS — Z86711 Personal history of pulmonary embolism: Secondary | ICD-10-CM | POA: Diagnosis not present

## 2020-11-18 DIAGNOSIS — I82502 Chronic embolism and thrombosis of unspecified deep veins of left lower extremity: Secondary | ICD-10-CM | POA: Diagnosis not present

## 2020-11-20 DIAGNOSIS — J449 Chronic obstructive pulmonary disease, unspecified: Secondary | ICD-10-CM | POA: Diagnosis not present

## 2020-11-23 ENCOUNTER — Telehealth: Payer: Self-pay | Admitting: *Deleted

## 2020-11-23 DIAGNOSIS — Z86711 Personal history of pulmonary embolism: Secondary | ICD-10-CM | POA: Diagnosis not present

## 2020-11-23 DIAGNOSIS — I82502 Chronic embolism and thrombosis of unspecified deep veins of left lower extremity: Secondary | ICD-10-CM | POA: Diagnosis not present

## 2020-11-23 NOTE — Telephone Encounter (Signed)
Homecare advise given to patient daughter per end of note.

## 2020-11-23 NOTE — Telephone Encounter (Signed)
Who Is Calling Patient / Member / Family / Caregiver Call Type Triage / Clinical Caller Name Jules Baty Relationship To Patient Daughter Return Phone Number [phone] (Primary) Chief Complaint Vomiting Reason for Call Symptomatic / Request for [place] states her father is experiencing vomiting and diarrhea(has urinated in the past 8 hours) and is currently taking Warfarin and would like to [employer]. Translation No Nurse Assessment Nurse: Ysidro Evert, RN, Levada Dy Date/Time (Eastern Time): 11/21/2020 9:43:57 AM Confirm and document reason for call. If symptomatic, describe symptoms. ---Caller states he vomited last night and had some diarrhea this morning. No fever.  Disp. Time Eilene Ghazi Time) Disposition Final User 11/21/2020 9:52:22 AM Home Care Yes Ysidro Evert, RN, Marin Shutter Disagree/Comply Comply Caller Understands Yes PreDisposition Did not know what to do Care Advice Given Per Guideline HOME CARE: * You should be able to treat this at home. REASSURANCE AND EDUCATION - DIARRHEA: * Diarrhea may caused by a virus ('stomach flu') or a bacteria. Diarrhea is one of the body's way of getting rid of germs. FLUID THERAPY DURING MILD TO MODERATE DIARRHEA: * Drink more fluids, at least 8 to 10 cups daily. One cup equals 8 oz (240 ml). * Staying well-hydrated is the most important thing if you have diarrhea. From what you have told me, it sounds like you are not severely dehydrated at this point. * WATER: For mild to moderate diarrhea, water is often the best liquid to drink. You should also eat some salty foods (e.g., potato chips, pretzels, saltine crackers). This is important to make sure you are getting enough salt, sugars, and fluids to meet your body's needs. * SPORTS DRINKS: You can also drink half-strength sports drinks (e.g., Gatorade, Powerade) to help treat and prevent dehydration. Mix the sports drink half and half with water. * Avoid  caffeinated beverages. Reason: Caffeine is mildly dehydrating. * Avoid alcohol beverages (e.g., beer, wine, hard liquor). * Avoid carbonated soft drinks (soda) as these can make your diarrhea worse. FOOD AND NUTRITION DURING MILD TO MODERATE DIARRHEA: * Maintaining some food intake during episodes of diarrhea is important. * Begin with boiled starches / cereals (e.g., potatoes, rice, noodles, wheat, oats) with a small amount of salt to taste. * You can also eat bananas, yogurt, crackers, soup. * Eat smaller meals and snacks more often during the day rather than 3 larger meals. * As the diarrhea starts to get better, you can slowly return to a normal diet. * Diarrhea lasts over 7 days * Signs of dehydration occur (e.g., no urine over 12 hours, very dry mouth, lightheaded, etc.) CALL BACK IF: * You become worse CARE ADVICE given per Diarrhea (Adult) guideline.

## 2020-12-07 ENCOUNTER — Other Ambulatory Visit: Payer: Self-pay

## 2020-12-07 ENCOUNTER — Ambulatory Visit: Payer: Medicare HMO | Admitting: Cardiology

## 2020-12-07 ENCOUNTER — Encounter: Payer: Self-pay | Admitting: Cardiology

## 2020-12-07 VITALS — BP 130/86 | HR 67 | Ht 70.5 in | Wt 226.0 lb

## 2020-12-07 DIAGNOSIS — I10 Essential (primary) hypertension: Secondary | ICD-10-CM | POA: Diagnosis not present

## 2020-12-07 DIAGNOSIS — E785 Hyperlipidemia, unspecified: Secondary | ICD-10-CM | POA: Diagnosis not present

## 2020-12-07 DIAGNOSIS — R0602 Shortness of breath: Secondary | ICD-10-CM | POA: Diagnosis not present

## 2020-12-07 DIAGNOSIS — R7303 Prediabetes: Secondary | ICD-10-CM | POA: Diagnosis not present

## 2020-12-07 DIAGNOSIS — R079 Chest pain, unspecified: Secondary | ICD-10-CM

## 2020-12-07 DIAGNOSIS — R011 Cardiac murmur, unspecified: Secondary | ICD-10-CM
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
## 2020-12-07 NOTE — Patient Instructions (Signed)
Medication Instructions:  Your physician recommends that you continue on your current medications as directed. Please refer to the Current Medication list given to you today.  *If you need a refill on your cardiac medications before your next appointment, please call your pharmacy*   Lab Work: NOne If you have labs (blood work) drawn today and your tests are completely normal, you will receive your results only by: Marland Kitchen MyChart Message (if you have MyChart) OR . A paper copy in the mail If you have any lab test that is abnormal or we need to change your treatment, we will call you to review the results.   Testing/Procedures: Your physician has requested that you have an echocardiogram. Echocardiography is a painless test that uses sound waves to create images of your heart. It provides your doctor with information about the size and shape of your heart and how well your heart's chambers and valves are working. This procedure takes approximately one hour. There are no restrictions for this procedure.  Your physician has requested that you have a lexiscan myoview. For further information please visit [email or web address]. Please follow instruction sheet, as given.    Follow-Up: At [facility], you and your health needs are our priority.  As part of our continuing mission to provide you with exceptional heart care, we have created designated Provider Care Teams.  These Care Teams include your primary Cardiologist (physician) and Advanced Practice Providers (APPs -  Physician Assistants and Nurse Practitioners) who all work together to provide you with the care you need, when you need it.  We recommend signing up for the patient portal called "MyChart".  Sign up information is provided on this After Visit Summary.  MyChart is used to connect with patients for Virtual Visits (Telemedicine).  Patients are able to view lab/test results, encounter notes, upcoming appointments, etc.  Non-urgent  messages can be sent to your provider as well.   To learn more about what you can do with MyChart, go to [email or web address].    Your next appointment:   3 month(s)  The format for your next appointment:   In Person  Provider:   Berniece Salines, DO   Other [facility] at [facility] [address], [place],  [address] Phone: [phone]    Please arrive 15 minutes prior to your appointment time for registration and insurance purposes.  The test will take approximately 3 to 4 hours to complete; you may bring reading material.  If someone comes with you to your appointment, they will need to remain in the main lobby due to limited space in the testing area. **If you are pregnant or breastfeeding, please notify the nuclear lab prior to your appointment**  How to prepare for your Myocardial Perfusion Test: . Do not eat or drink 3 hours prior to your test, except you may have water. . Do not consume products containing caffeine (regular or decaffeinated) 12 hours prior to your test. (ex: coffee, chocolate, sodas, tea). . Do bring a list of your current medications with you.  If not listed below, you may take your medications as normal. . Do wear comfortable clothes (no dresses or overalls) and walking shoes, tennis shoes preferred (No heels or open toe shoes are allowed). . Do NOT wear cologne, perfume, aftershave, or lotions (deodorant is allowed). . If these instructions are not followed, your test will have to be rescheduled.  Please report to [address], [address] for  your test.  If you have questions or concerns about your appointment, you can call the Nuclear Lab at [phone].  If you cannot keep your appointment, please provide 24 hours notification to the Nuclear Lab, to avoid a possible $50 charge to your account. Echocardiogram An echocardiogram is a test that uses sound waves (ultrasound) to  produce images of the heart. Images from an echocardiogram can provide important information about:  Heart size and shape.  The size and thickness and movement of your heart's walls.  Heart muscle function and strength.  Heart valve function or if you have stenosis. Stenosis is when the heart valves are too narrow.  If blood is flowing backward through the heart valves (regurgitation).  A tumor or infectious growth around the heart valves.  Areas of heart muscle that are not working well because of poor blood flow or injury from a heart attack.  Aneurysm detection. An aneurysm is a weak or damaged part of an artery wall. The wall bulges out from the normal force of blood pumping through the body. Tell a health care provider about:  Any allergies you have.  All medicines you are taking, including vitamins, herbs, eye drops, creams, and over-the-counter medicines.  Any blood disorders you have.  Any surgeries you have had.  Any medical conditions you have.  Whether you are pregnant or may be pregnant. What are the risks? Generally, this is a safe test. However, problems may occur, including an allergic reaction to dye (contrast) that may be used during the test. What happens before the test? No specific preparation is needed. You may eat and drink normally. What happens during the test?  You will take off your clothes from the waist up and put on a hospital gown.  Electrodes or electrocardiogram (ECG)patches may be placed on your chest. The electrodes or patches are then connected to a device that monitors your heart rate and rhythm.  You will lie down on a table for an ultrasound exam. A gel will be applied to your chest to help sound waves pass through your skin.  A handheld device, called a transducer, will be pressed against your chest and moved over your heart. The transducer produces sound waves that travel to your heart and bounce back (or "echo" back) to the  transducer. These sound waves will be captured in real-time and changed into images of your heart that can be viewed on a video monitor. The images will be recorded on a computer and reviewed by your health care provider.  You may be asked to change positions or hold your breath for a short time. This makes it easier to get different views or better views of your heart.  In some cases, you may receive contrast through an IV in one of your veins. This can improve the quality of the pictures from your heart. The procedure may vary among health care providers and hospitals.   What can I expect after the test? You may return to your normal, everyday life, including diet, activities, and medicines, unless your health care provider tells you not to do that. Follow these instructions at home:  It is up to you to get the results of your test. Ask your health care provider, or the department that is doing the test, when your results will be ready.  Keep all follow-up visits. This is important. Summary  An echocardiogram is a test that uses sound waves (ultrasound) to produce images of the heart.  Images from  an echocardiogram can provide important information about the size and shape of your heart, heart muscle function, heart valve function, and other possible heart problems.  You do not need to do anything to prepare before this test. You may eat and drink normally.  After the echocardiogram is completed, you may return to your normal, everyday life, unless your health care provider tells you not to do that. This information is not intended to replace advice given to you by your health care provider. Make sure you discuss any questions you have with your health care provider. Document Revised: 02/11/2020 Document Reviewed: 02/11/2020 Elsevier Patient Education  2021 [employer].

## 2020-12-09 DIAGNOSIS — R791 Abnormal coagulation profile: Secondary | ICD-10-CM | POA: Diagnosis not present

## 2020-12-09 DIAGNOSIS — Z86711 Personal history of pulmonary embolism: Secondary | ICD-10-CM | POA: Diagnosis not present

## 2020-12-09 DIAGNOSIS — I82502 Chronic embolism and thrombosis of unspecified deep veins of left lower extremity: Secondary | ICD-10-CM | POA: Diagnosis not present

## 2020-12-11 ENCOUNTER — Other Ambulatory Visit: Payer: Self-pay | Admitting: Family Medicine

## 2020-12-21 DIAGNOSIS — J449 Chronic obstructive pulmonary disease, unspecified: Secondary | ICD-10-CM | POA: Diagnosis not present

## 2020-12-24 ENCOUNTER — Ambulatory Visit (INDEPENDENT_AMBULATORY_CARE_PROVIDER_SITE_OTHER): Payer: Medicare HMO | Admitting: Family Medicine

## 2020-12-24 ENCOUNTER — Other Ambulatory Visit: Payer: Self-pay

## 2020-12-24 DIAGNOSIS — I10 Essential (primary) hypertension: Secondary | ICD-10-CM

## 2020-12-24 DIAGNOSIS — R011 Cardiac murmur, unspecified: Secondary | ICD-10-CM

## 2020-12-24 DIAGNOSIS — G8929 Other chronic pain: Secondary | ICD-10-CM

## 2020-12-24 DIAGNOSIS — M25561 Pain in right knee: Secondary | ICD-10-CM | POA: Diagnosis not present

## 2020-12-24 DIAGNOSIS — E7800 Pure hypercholesterolemia, unspecified: Secondary | ICD-10-CM | POA: Diagnosis not present

## 2020-12-24 DIAGNOSIS — D649 Anemia, unspecified: Secondary | ICD-10-CM

## 2020-12-24 NOTE — Patient Instructions (Addendum)
Need a copy of FMLA form to fill out  Check with cardiology about switching from Coumadin to Eliquis  Consider getting a tetanus shot at the pharmacy (tdap)  Shingrix is the new shingles shot, 2 shots over 2-6 months, confirm coverage with insurance and document, then can return here for shots with nurse appt or at pharmacy   Chronic Knee Pain, Adult Chronic knee pain is pain in one or both knees that lasts longer than 3 months. Symptoms of chronic knee pain may include swelling, stiffness, and discomfort. Age-related wear and tear (osteoarthritis) of the knee joint is the most common cause of chronic knee pain. Other possible causes include: A long-term immune-related disease that causes inflammation of the knee (rheumatoid arthritis). This usually affects both knees. Inflammatory arthritis, such as gout or pseudogout. An injury to the knee that causes arthritis. An injury to the knee that damages the ligaments. Ligaments are strong tissues that connect bones to each other. Runner's knee or pain behind the kneecap. Treatment for chronic knee pain depends on the cause. The main treatments for chronic knee pain are physical therapy and weight loss. This condition may also be treated with medicines, injections, a knee sleeve or brace, and by using crutches. Rest, ice, pressure (compression), and elevation, also known as RICE therapy, may also be recommended. Follow these instructions at home: If you have a knee sleeve or brace:  Wear the knee sleeve or brace as told by your health care provider. Remove it only as told by your health care provider. Loosen it if your toes tingle, become numb, or turn cold and blue. Keep it clean. If the sleeve or brace is not waterproof: Do not let it get wet. Remove it if allowed by your health care provider, or cover it with a watertight covering when you take a bath or a shower.  Managing pain, stiffness, and swelling     If directed, apply heat to the  affected area as often as told by your health care provider. Use the heat source that your health care provider recommends, such as a moist heat pack or a heating pad. If you have a removable knee sleeve or brace, remove it as told by your health care provider. Place a towel between your skin and the heat source. Leave the heat on for 20-30 minutes. Remove the heat if your skin turns bright red. This is especially important if you are unable to feel pain, heat, or cold. You may have a greater risk of getting burned. If directed, put ice on the affected area. To do this: If you have a removable knee sleeve or brace, remove it as told by your health care provider. Put ice in a plastic bag. Place a towel between your skin and the bag. Leave the ice on for 20 minutes, 2-3 times a day. Remove the ice if your skin turns bright red. This is very important. If you cannot feel pain, heat, or cold, you have a greater risk of damage to the area. Move your toes often to reduce stiffness and swelling. Raise (elevate) the injured area above the level of your heart while you are sitting or lying down. Activity Avoid high-impact activities or exercises, such as running, jumping rope, or doing jumping jacks. Follow the exercise plan that your health care provider designed for you. Your health care provider may suggest that you: Avoid activities that make knee pain worse. This may require you to change your exercise routines, sport participation, or  job duties. Wear shoes with cushioned soles. Avoid sports that require running and sudden changes in direction. Do physical therapy. Physical therapy is planned to match your needs and abilities. It may include exercises for strength, flexibility, stability, and endurance. Do exercises that increase balance and strength, such as tai chi and yoga. Do not use the injured limb to support your body weight until your health care provider says that you can. Use crutches as  told by your health care provider. Return to your normal activities as told by your health care provider. Ask your health care provider what activities are safe for you. General instructions Take over-the-counter and prescription medicines only as told by your health care provider. Lose weight if you are overweight. Losing even a little weight can reduce knee pain. Ask your health care provider what your ideal weight is, and how to safely lose extra weight. A dietitian may be able to help you plan your meals. Do not use any products that contain nicotine or tobacco, such as cigarettes, e-cigarettes, and chewing tobacco. These can delay healing. If you need help quitting, ask your health care provider. Keep all follow-up visits. This is important. Contact a health care provider if: You have knee pain that is not getting better or gets worse. You are unable to do your physical therapy exercises due to knee pain. Get help right away if: Your knee swells and the swelling becomes worse. You cannot move your knee. You have severe knee pain. Summary Knee pain that lasts more than 3 months is considered chronic knee pain. The main treatments for chronic knee pain are physical therapy and weight loss. You may also need to take medicines, wear a knee sleeve or brace, use crutches, and apply ice or heat. Losing even a little weight can reduce knee pain. Ask your health care provider what your ideal weight is, and how to safely lose extra weight. A dietitian may be able to help you plan your meals. Follow the exercise plan that your health care provider designed for you. This information is not intended to replace advice given to you by your health care provider. Make sure you discuss any questions you have with your healthcare provider. Document Revised: 12/04/2019 Document Reviewed: 12/04/2019 Elsevier Patient Education  2022 [employer].

## 2020-12-24 NOTE — Assessment & Plan Note (Signed)
Well controlled, no changes to meds. Encouraged heart healthy diet such as the DASH diet and exercise as tolerated.  °

## 2020-12-24 NOTE — Assessment & Plan Note (Signed)
Tolerating statin, encouraged heart healthy diet, avoid trans fats, minimize simple carbs and saturated fats. Increase exercise as tolerated 

## 2020-12-27 NOTE — Assessment & Plan Note (Signed)
Awaiting final cardiac clearance and then he is anxious to proceed with surgery secondary to his level of pain. He is medically cleared once he receives cardiac clearance then he can have his surgery
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
## 2020-12-27 NOTE — Assessment & Plan Note (Addendum)
Increase leafy greens, consider increased lean red meat and using cast iron cookware. Continue to monitor, report any concerns, improving

## 2020-12-29 DIAGNOSIS — Z86711 Personal history of pulmonary embolism: Secondary | ICD-10-CM | POA: Diagnosis not present

## 2020-12-29 DIAGNOSIS — I82502 Chronic embolism and thrombosis of unspecified deep veins of left lower extremity: Secondary | ICD-10-CM | POA: Diagnosis not present

## 2020-12-30 ENCOUNTER — Other Ambulatory Visit: Payer: Self-pay

## 2020-12-30 ENCOUNTER — Ambulatory Visit (HOSPITAL_BASED_OUTPATIENT_CLINIC_OR_DEPARTMENT_OTHER)
Admission: RE | Admit: 2020-12-30 | Discharge: 2020-12-30 | Disposition: A | Discharge status: Home / Self Care | Payer: Medicare HMO | Source: Ambulatory Visit | Attending: Cardiology | Admitting: Cardiology

## 2020-12-30 DIAGNOSIS — R0602 Shortness of breath: Secondary | ICD-10-CM | POA: Diagnosis not present

## 2020-12-30 DIAGNOSIS — R011 Cardiac murmur, unspecified: Secondary | ICD-10-CM | POA: Diagnosis not present

## 2020-12-30 LAB — ECHOCARDIOGRAM COMPLETE
AR max vel: 1.87 cm2
AV Area VTI: 2.25 cm2
AV Area mean vel: 1.82 cm2
AV Mean grad: 13.3 mmHg
AV Peak grad: 22.5 mmHg
Ao pk vel: 2.37 m/s
Area-P 1/2: 2.99 cm2
Calc EF: 88.5 %
S' Lateral: 1.87 cm
Single Plane A2C EF: 92.8 %
Single Plane A4C EF: 80.6 %

## 2020-12-30 NOTE — Progress Notes (Signed)
*  PRELIMINARY RESULTS* Echocardiogram 2D Echocardiogram has been performed.  Luisa Hart RDCS 12/30/2020, 12:20 PM

## 2020-12-31 ENCOUNTER — Encounter (HOSPITAL_COMMUNITY): Payer: Medicare HMO

## 2020-12-31 ENCOUNTER — Telehealth (HOSPITAL_COMMUNITY): Payer: Self-pay | Admitting: *Deleted

## 2020-12-31 NOTE — Telephone Encounter (Signed)
Left message on voicemail per DPR in reference to upcoming appointment scheduled on 01/08/21 at 10:15 with detailed instructions given per Myocardial Perfusion Study Information Sheet for the test. LM to arrive 15 minutes early, and that it is imperative to arrive on time for appointment to keep from having the test rescheduled. If you need to cancel or reschedule your appointment, please call the office within 24 hours of your appointment. Failure to do so may result in a cancellation of your appointment, and a $50 no show fee. Phone number given for call back for any questions.

## 2021-01-01 ENCOUNTER — Telehealth: Payer: Self-pay

## 2021-01-01 NOTE — Telephone Encounter (Signed)
Spoke with patient's wife and let her know her husband will have to have the stress test before Dr. Harriet Masson can clear him for the knee surgery. She thanked me for calling her back before the weekend.

## 2021-01-08 ENCOUNTER — Ambulatory Visit (HOSPITAL_COMMUNITY): Payer: Medicare HMO | Attending: Cardiology

## 2021-01-08 ENCOUNTER — Other Ambulatory Visit: Payer: Self-pay

## 2021-01-08 DIAGNOSIS — R0602 Shortness of breath: Secondary | ICD-10-CM | POA: Diagnosis not present

## 2021-01-08 LAB — MYOCARDIAL PERFUSION IMAGING
LV dias vol: 83 mL (ref 62–150)
LV sys vol: 31 mL
Peak HR: 91 {beats}/min
Rest HR: 61 {beats}/min
SDS: 2
SRS: 0
SSS: 2
TID: 1.06

## 2021-01-08 MED ORDER — TECHNETIUM TC 99M TETROFOSMIN IV KIT
11.0000 | PACK | Freq: Once | INTRAVENOUS | Status: DC | PRN
  Filled 2021-01-08: qty 11

## 2021-01-08 MED ORDER — TECHNETIUM TC 99M TETROFOSMIN IV KIT
30.4000 | PACK | Freq: Once | INTRAVENOUS | Status: AC | PRN
  Administered 2021-01-08: 30.4 via INTRAVENOUS
  Filled 2021-01-08: qty 31

## 2021-01-08 MED ORDER — REGADENOSON 0.4 MG/5ML IV SOLN
0.4000 mg | Freq: Once | INTRAVENOUS | Status: AC
  Administered 2021-01-08: 0.4 mg via INTRAVENOUS

## 2021-01-08 MED ORDER — TECHNETIUM TC 99M TETROFOSMIN IV KIT
11.0000 | PACK | Freq: Once | INTRAVENOUS | Status: AC | PRN
  Administered 2021-01-08: 11 via INTRAVENOUS
  Filled 2021-01-08: qty 11

## 2021-01-20 DIAGNOSIS — J449 Chronic obstructive pulmonary disease, unspecified: Secondary | ICD-10-CM | POA: Diagnosis not present

## 2021-01-21 ENCOUNTER — Encounter: Payer: Self-pay | Admitting: Family Medicine

## 2021-01-26 DIAGNOSIS — Z86711 Personal history of pulmonary embolism: Secondary | ICD-10-CM | POA: Diagnosis not present

## 2021-01-26 DIAGNOSIS — I82502 Chronic embolism and thrombosis of unspecified deep veins of left lower extremity: Secondary | ICD-10-CM | POA: Diagnosis not present

## 2021-01-27 ENCOUNTER — Telehealth: Payer: Self-pay

## 2021-01-27 ENCOUNTER — Encounter: Payer: Self-pay | Admitting: Family Medicine

## 2021-01-27 DIAGNOSIS — M1711 Unilateral primary osteoarthritis, right knee: Secondary | ICD-10-CM | POA: Diagnosis not present

## 2021-01-27 NOTE — Telephone Encounter (Signed)
   [facility] Pre-operative Risk Assessment    Patient Name: Victor Cooper  DOB: 1941-04-08 MRN: [id number]  HEARTCARE STAFF:  - IMPORTANT!!!!!! Under Visit Info/Reason for Call, type in Other and utilize the format Clearance MM/DD/YY or Clearance TBD. Do not use dashes or single digits. - Please review there is not already an duplicate clearance open for this procedure. - If request is for dental extraction, please clarify the # of teeth to be extracted. - If the patient is currently at the dentist's office, call Pre-Op Callback Staff (MA/nurse) to input urgent request.  - If the patient is not currently in the dentist office, please route to the Pre-Op pool.  Request for surgical clearance:  What type of surgery is being performed? Right total knee arthroplasty  When is this surgery scheduled? TBD  What type of clearance is required (medical clearance vs. Pharmacy clearance to hold med vs. Both)? Medical   Are there any medications that need to be held prior to surgery and how long? Aspirin  Practice name and name of physician performing surgery? [facility], [facility]: Dr. Jonn Shingles  What is the office phone number?  [phone]   7.   What is the office fax number? [phone]  8.   Anesthesia type (None, local, MAC, general) ? Spinal   Orvan July 01/27/2021, 2:29 PM  _________________________________________________________________   (provider comments below)

## 2021-01-28 NOTE — Telephone Encounter (Signed)
Please advise on holding warfarin prior to right total knee arthroplasty.  Thank you.

## 2021-01-29 NOTE — Telephone Encounter (Signed)
Victor Cooper 80 year old male is requesting preoperative cardiac evaluation for right total knee arthroplasty.  He was last seen in the clinic on 12/07/2020.  During that time he complained of some intermittent mild chest discomfort.  He denied radiation.  He also reported shortness of breath with increased physical activity.  Nuclear stress test was ordered and showed EF 63% and was low risk 01/08/2021.  Has a PMH includes COPD, hyperlipidemia, PE, degenerative disc disease, and anemia.   May his aspirin be held prior to his surgery?  Thank you for your help.  Please direct your response to [facility] preop pool.  Jossie Ng. Victor Montenegro NP-C    01/29/2021, 1:46 PM [facility] [address] Office [phone] Fax [phone]

## 2021-01-29 NOTE — Telephone Encounter (Signed)
Patient with history of PE followed by [facility].  Note from PCP indicates they will help with bridging for procedure.  Would recommend that they give clearance for warfarin as we don't have the information needed to determine if bridging is needed.

## 2021-02-03 NOTE — Telephone Encounter (Signed)
    Patient Name: Victor Cooper  DOB: [birth year] MRN: [id number]  Primary Cardiologist: Berniece Salines, DO  Chart reviewed as part of pre-operative protocol coverage. Given past medical history and time since last visit, based on ACC/AHA guidelines, ADEBAYO HOLTER would be at acceptable risk for the planned procedure without further cardiovascular testing.  Recent echocardiogram and stress test were reassuring.  Per Dr. Harriet Masson, patient may hold aspirin for 7 days prior to the procedure.  His Coumadin is being managed by primary care provider.  Patient has been informed to double check with his PCP on how many days to hold Coumadin.  The patient was advised that if he develops new symptoms prior to surgery to contact our office to arrange for a follow-up visit, and he verbalized understanding.  I will route this recommendation to the requesting party via Epic fax function and remove from pre-op pool.  Please call with questions.  [place], [place] 02/03/2021, 4:33 PM

## 2021-02-10 ENCOUNTER — Other Ambulatory Visit: Payer: Self-pay

## 2021-02-10 ENCOUNTER — Ambulatory Visit (INDEPENDENT_AMBULATORY_CARE_PROVIDER_SITE_OTHER): Payer: Medicare HMO | Admitting: Medical

## 2021-02-10 VITALS — BP 141/55 | HR 74 | Resp 18 | Ht 70.0 in | Wt 231.0 lb

## 2021-02-10 DIAGNOSIS — Z8551 Personal history of malignant neoplasm of bladder: Secondary | ICD-10-CM | POA: Diagnosis not present

## 2021-02-10 DIAGNOSIS — R319 Hematuria, unspecified: Secondary | ICD-10-CM

## 2021-02-10 DIAGNOSIS — R350 Frequency of micturition: Secondary | ICD-10-CM

## 2021-02-10 LAB — POC URINALSYSI DIPSTICK (AUTOMATED)
Bilirubin, UA: NEGATIVE
Blood, UA: POSITIVE
Glucose, UA: NEGATIVE
Ketones, UA: NEGATIVE
Nitrite, UA: NEGATIVE
Protein, UA: POSITIVE — AB
Spec Grav, UA: 1.025 (ref 1.010–1.025)
Urobilinogen, UA: 0.2 E.U./dL
pH, UA: 5.5 (ref 5.0–8.0)

## 2021-02-10 LAB — PSA: PSA: 0.98 ng/mL (ref 0.10–4.00)

## 2021-02-10 MED ORDER — SULFAMETHOXAZOLE-TRIMETHOPRIM 800-160 MG PO TABS: 1.0000 | ORAL_TABLET | Freq: Two times a day (BID) | ORAL | 0 refills | Status: DC

## 2021-02-10 NOTE — Patient Instructions (Addendum)
Frequent urination and gross hematuria. Will prescribe bactrim ds for possible uti and prostatitis. With hx of bladder cancer will place referral back to your urologist as you may need repeat cystoscopy.   Follow studies and notify you. If you have worse or changing signs or symptoms notify us.  Hydrate well.  You have contacted the inr clinic. Follow there advise on when to repeat inr since starting antibiotic.  Follow up in 10-14 days or sooner if needed.
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
## 2021-02-12 LAB — URINE CULTURE
MICRO NUMBER:: 12225165
SPECIMEN QUALITY:: ADEQUATE

## 2021-02-14 MED ORDER — AMOXICILLIN-POT CLAVULANATE 875-125 MG PO TABS: 1.0000 | ORAL_TABLET | Freq: Two times a day (BID) | ORAL | 0 refills | Status: DC

## 2021-02-14 NOTE — Addendum Note (Signed)
Addended by: Anabel Halon on: 02/14/2021 09:51 AM   Modules accepted: Orders

## 2021-02-15 ENCOUNTER — Telehealth: Payer: Self-pay | Admitting: *Deleted

## 2021-02-15 NOTE — Telephone Encounter (Signed)
Spoke with Curt Bears she stated that you will need to clear him for surgery.  She only manage the coumadin levels.  She cannot advise to hold or stop coumadin.  She stated that he has had the lovenox bridge in the past.  If your able to hold the warfarin for the procedure then they could manage the bridge.  His surgery date is scheduled for 03/17/21.

## 2021-02-15 NOTE — Telephone Encounter (Signed)
Form placed in yellow folder for provider

## 2021-02-16 DIAGNOSIS — R791 Abnormal coagulation profile: Secondary | ICD-10-CM | POA: Diagnosis not present

## 2021-02-16 DIAGNOSIS — Z86711 Personal history of pulmonary embolism: Secondary | ICD-10-CM | POA: Diagnosis not present

## 2021-02-16 DIAGNOSIS — I82502 Chronic embolism and thrombosis of unspecified deep veins of left lower extremity: Secondary | ICD-10-CM | POA: Diagnosis not present

## 2021-02-17 DIAGNOSIS — R310 Gross hematuria: Secondary | ICD-10-CM | POA: Diagnosis not present

## 2021-02-17 DIAGNOSIS — N3941 Urge incontinence: Secondary | ICD-10-CM | POA: Diagnosis not present

## 2021-02-17 DIAGNOSIS — R82998 Other abnormal findings in urine: Secondary | ICD-10-CM | POA: Diagnosis not present

## 2021-02-17 DIAGNOSIS — Z8551 Personal history of malignant neoplasm of bladder: Secondary | ICD-10-CM | POA: Diagnosis not present

## 2021-02-17 DIAGNOSIS — N390 Urinary tract infection, site not specified: Secondary | ICD-10-CM | POA: Diagnosis not present

## 2021-02-17 DIAGNOSIS — R319 Hematuria, unspecified: Secondary | ICD-10-CM | POA: Diagnosis not present

## 2021-02-17 NOTE — Telephone Encounter (Signed)
Form has been faxed to [facility] on 02/16/21.

## 2021-02-20 DIAGNOSIS — J449 Chronic obstructive pulmonary disease, unspecified: Secondary | ICD-10-CM | POA: Diagnosis not present

## 2021-02-23 DIAGNOSIS — I82502 Chronic embolism and thrombosis of unspecified deep veins of left lower extremity: Secondary | ICD-10-CM | POA: Diagnosis not present

## 2021-02-23 DIAGNOSIS — Z86711 Personal history of pulmonary embolism: Secondary | ICD-10-CM | POA: Diagnosis not present

## 2021-02-23 NOTE — H&P (View-Only) (Signed)
Sent message, via epic in basket, requesting orders in epic from surgeon.  

## 2021-02-23 NOTE — Progress Notes (Signed)
Sent message, via epic in basket, requesting orders in epic from surgeon.  

## 2021-02-24 ENCOUNTER — Telehealth: Payer: Self-pay

## 2021-02-24 NOTE — Telephone Encounter (Signed)
Spoke to [facility] she states the pt's Coumadin was addressed on 8/16 in regards to his knee procedure in Sept.

## 2021-03-03 NOTE — Progress Notes (Addendum)
COVID swab appointment: 03/15/21  COVID Vaccine Completed: yes x3 Date COVID Vaccine completed: Has received booster: COVID vaccine manufacturer: [employer]   Date of COVID positive in last 90 days: No  PCP - Penni Homans, MD Cardiologist - Berniece Salines, DO  Chest x-ray - 03/21/20 Epic EKG - 12/07/20 Epic Stress Test - 01/08/21 Epic ECHO - 12/30/20 Epic Cardiac Cath - N/a Pacemaker/ICD device last checked: N/a Spinal Cord Stimulator: N/a  Sleep Study - N/a CPAP -   Fasting Blood Sugar - N/a Checks Blood Sugar _____ times a day  Blood Thinner Instructions: Warfarin, hold 2 days prior to surgery. Bridge with Lovenox '150mg'$ /kg every 24 hours. Aspirin Instructions: ASA 81, hold 7 days Last Dose:  Activity level: Can go up a flight of stairs and perform activities of daily living without stopping and without symptoms of chest pain or shortness of breath. Difficulty with knee      Anesthesia review: HTN, PE, heart murmur, 1st degree AV block, COPD  Patient denies shortness of breath, fever, cough and chest pain at PAT appointment   Patient verbalized understanding of instructions that were given to them at the PAT appointment. Patient was also instructed that they will need to review over the PAT instructions again at home before surgery.

## 2021-03-03 NOTE — Patient Instructions (Addendum)
DUE TO COVID-19 ONLY ONE VISITOR IS ALLOWED TO COME WITH YOU AND STAY IN THE WAITING ROOM ONLY DURING PRE OP AND PROCEDURE.   **NO VISITORS ARE ALLOWED IN THE SHORT STAY AREA OR RECOVERY ROOM!!**  IF YOU WILL BE ADMITTED INTO THE HOSPITAL YOU ARE ALLOWED ONLY TWO SUPPORT PEOPLE DURING VISITATION HOURS ONLY (10AM -8PM)   The support person(s) may change daily. The support person(s) must pass our screening, gel in and out, and wear a mask at all times, including in the patient's room. Patients must also wear a mask when staff or their support person are in the room.  No visitors under the age of 37. Any visitor under the age of 24 must be accompanied by an adult.    COVID SWAB TESTING MUST BE COMPLETED ON:  03/15/21 **MUST PRESENT COMPLETED FORM AT TESTING SITE**    [place] (backside of the building) You are not required to quarantine, however you are required to wear a well-fitted mask when you are out and around people not in your household.  Hand Hygiene often Do NOT share personal items Notify your provider if you are in close contact with someone who has COVID or you develop fever 100.4 or greater, new onset of sneezing, cough, sore throat, shortness of breath or body aches.       Your procedure is scheduled on: 03/17/21   Report to [facility] Main  Entrance   Report to Short Stay at 5:45 AM   [facility])   Call this number if you have problems the morning of surgery [phone]   Do not eat food :After Midnight.   May have liquids until 5:30 AM day of surgery  CLEAR LIQUID DIET  Foods Allowed                                                                     Foods Excluded  Water, Black Coffee and tea (No milk or creamer)          liquids that you cannot  Plain Jell-O in any flavor  (No red)                                   see through such as: Fruit ices (not with fruit pulp)                                           milk, soups,  orange juice              Iced Popsicles (No red)                                               All solid food                                   Apple juices Sports  drinks like Gatorade (No red) Lightly seasoned clear broth or consume(fat free) Sugar     The day of surgery:  Drink ONE (1) Pre-Surgery Clear Ensure by 5:30 am the morning of surgery. Drink in one sitting. Do not sip.  This drink was given to you during your hospital  pre-op appointment visit. Nothing else to drink after completing the  Pre-Surgery Clear Ensure.          If you have questions, please contact your surgeon's office.     Oral Hygiene is also important to reduce your risk of infection.                                    Remember - BRUSH YOUR TEETH THE MORNING OF SURGERY WITH YOUR REGULAR TOOTHPASTE   Take these medicines the morning of surgery with A SIP OF WATER: Tylenol, Amlodipine, Rosuvastatin.                               You may not have any metal on your body including jewelry, and body piercing             Do not wear lotions, powders, cologne, or deodorant               Men may shave face and neck.   Do not bring valuables to the hospital. [place].   Contacts, dentures or bridgework may not be worn into surgery.   Bring small overnight bag day of surgery.  Please read over the following fact sheets you were given: IF YOU HAVE QUESTIONS ABOUT YOUR PRE OP INSTRUCTIONS PLEASE CALL [place] - Preparing for Surgery Before surgery, you can play an important role.  Because skin is not sterile, your skin needs to be as free of germs as possible.  You can reduce the number of germs on your skin by washing with CHG (chlorahexidine gluconate) soap before surgery.  CHG is an antiseptic cleaner which kills germs and bonds with the skin to continue killing germs even after washing. Please DO NOT use if you have an allergy to CHG  or antibacterial soaps.  If your skin becomes reddened/irritated stop using the CHG and inform your nurse when you arrive at Short Stay. Do not shave (including legs and underarms) for at least 48 hours prior to the first CHG shower.  You may shave your face/neck.  Please follow these instructions carefully:  1.  Shower with CHG Soap the night before surgery and the  morning of surgery.  2.  If you choose to wash your hair, wash your hair first as usual with your normal  shampoo.  3.  After you shampoo, rinse your hair and body thoroughly to remove the shampoo.                             4.  Use CHG as you would any other liquid soap.  You can apply chg directly to the skin and wash.  Gently with a scrungie or clean washcloth.  5.  Apply the CHG Soap to your body ONLY FROM THE NECK DOWN.   Do   not use on face/ open  Wound or open sores. Avoid contact with eyes, ears mouth and   genitals (private parts).                       Wash face,  Genitals (private parts) with your normal soap.             6.  Wash thoroughly, paying special attention to the area where your    surgery  will be performed.  7.  Thoroughly rinse your body with warm water from the neck down.  8.  DO NOT shower/wash with your normal soap after using and rinsing off the CHG Soap.                9.  Pat yourself dry with a clean towel.            10.  Wear clean pajamas.            11.  Place clean sheets on your bed the night of your first shower and do not  sleep with pets. Day of Surgery : Do not apply any lotions/deodorants the morning of surgery.  Please wear clean clothes to the hospital/surgery center.  FAILURE TO FOLLOW THESE INSTRUCTIONS MAY RESULT IN THE CANCELLATION OF YOUR SURGERY  PATIENT SIGNATURE_________________________________  NURSE SIGNATURE__________________________________  ________________________________________________________________________   Victor Cooper  An  incentive spirometer is a tool that can help keep your lungs clear and active. This tool measures how well you are filling your lungs with each breath. Taking long deep breaths may help reverse or decrease the chance of developing breathing (pulmonary) problems (especially infection) following: A long period of time when you are unable to move or be active. BEFORE THE PROCEDURE  If the spirometer includes an indicator to show your best effort, your nurse or respiratory therapist will set it to a desired goal. If possible, sit up straight or lean slightly forward. Try not to slouch. Hold the incentive spirometer in an upright position. INSTRUCTIONS FOR USE  Sit on the edge of your bed if possible, or sit up as far as you can in bed or on a chair. Hold the incentive spirometer in an upright position. Breathe out normally. Place the mouthpiece in your mouth and seal your lips tightly around it. Breathe in slowly and as deeply as possible, raising the piston or the ball toward the top of the column. Hold your breath for 3-5 seconds or for as long as possible. Allow the piston or ball to fall to the bottom of the column. Remove the mouthpiece from your mouth and breathe out normally. Rest for a few seconds and repeat Steps 1 through 7 at least 10 times every 1-2 hours when you are awake. Take your time and take a few normal breaths between deep breaths. The spirometer may include an indicator to show your best effort. Use the indicator as a goal to work toward during each repetition. After each set of 10 deep breaths, practice coughing to be sure your lungs are clear. If you have an incision (the cut made at the time of surgery), support your incision when coughing by placing a pillow or rolled up towels firmly against it. Once you are able to get out of bed, walk around indoors and cough well. You may stop using the incentive spirometer when instructed by your caregiver.  RISKS AND COMPLICATIONS Take  your time so you do not get dizzy or light-headed. If you are in pain, you  may need to take or ask for pain medication before doing incentive spirometry. It is harder to take a deep breath if you are having pain. AFTER USE Rest and breathe slowly and easily. It can be helpful to keep track of a log of your progress. Your caregiver can provide you with a simple table to help with this. If you are using the spirometer at home, follow these instructions: [place] IF:  You are having difficultly using the spirometer. You have trouble using the spirometer as often as instructed. Your pain medication is not giving enough relief while using the spirometer. You develop fever of 100.5 F (38.1 C) or higher. SEEK IMMEDIATE MEDICAL CARE IF:  You cough up bloody sputum that had not been present before. You develop fever of 102 F (38.9 C) or greater. You develop worsening pain at or near the incision site. MAKE SURE YOU:  Understand these instructions. Will watch your condition. Will get help right away if you are not doing well or get worse. Document Released: 10/31/2006 Document Revised: 09/12/2011 Document Reviewed: 01/01/2007 ExitCare Patient Information 2014 ExitCare, [place].   ________________________________________________________________________    WHAT IS A BLOOD TRANSFUSION? Blood Transfusion Information  A transfusion is the replacement of blood or some of its parts. Blood is made up of multiple cells which provide different functions. Red blood cells carry oxygen and are used for blood loss replacement. White blood cells fight against infection. Platelets control bleeding. Plasma helps clot blood. Other blood products are available for specialized needs, such as hemophilia or other clotting disorders. BEFORE THE TRANSFUSION  Who gives blood for transfusions?  Healthy volunteers who are fully evaluated to make sure their blood is safe. This is blood bank  blood. Transfusion therapy is the safest it has ever been in the practice of medicine. Before blood is taken from a donor, a complete history is taken to make sure that person has no history of diseases nor engages in risky social behavior (examples are intravenous drug use or sexual activity with multiple partners). The donor's travel history is screened to minimize risk of transmitting infections, such as malaria. The donated blood is tested for signs of infectious diseases, such as HIV and hepatitis. The blood is then tested to be sure it is compatible with you in order to minimize the chance of a transfusion reaction. If you or a relative donates blood, this is often done in anticipation of surgery and is not appropriate for emergency situations. It takes many days to process the donated blood. RISKS AND COMPLICATIONS Although transfusion therapy is very safe and saves many lives, the main dangers of transfusion include:  Getting an infectious disease. Developing a transfusion reaction. This is an allergic reaction to something in the blood you were given. Every precaution is taken to prevent this. The decision to have a blood transfusion has been considered carefully by your caregiver before blood is given. Blood is not given unless the benefits outweigh the risks. AFTER THE TRANSFUSION Right after receiving a blood transfusion, you will usually feel much better and more energetic. This is especially true if your red blood cells have gotten low (anemic). The transfusion raises the level of the red blood cells which carry oxygen, and this usually causes an energy increase. The nurse administering the transfusion will monitor you carefully for complications. HOME CARE INSTRUCTIONS  No special instructions are needed after a transfusion. You may find your energy is better. Speak with your caregiver about any limitations  on activity for underlying diseases you may have. SEEK MEDICAL CARE IF:  Your  condition is not improving after your transfusion. You develop redness or irritation at the intravenous (IV) site. SEEK IMMEDIATE MEDICAL CARE IF:  Any of the following symptoms occur over the next 12 hours: Shaking chills. You have a temperature by mouth above 102 F (38.9 C), not controlled by medicine. Chest, back, or muscle pain. People around you feel you are not acting correctly or are confused. Shortness of breath or difficulty breathing. Dizziness and fainting. You get a rash or develop hives. You have a decrease in urine output. Your urine turns a dark color or changes to pink, red, or brown. Any of the following symptoms occur over the next 10 days: You have a temperature by mouth above 102 F (38.9 C), not controlled by medicine. Shortness of breath. Weakness after normal activity. The white part of the eye turns yellow (jaundice). You have a decrease in the amount of urine or are urinating less often. Your urine turns a dark color or changes to pink, red, or brown. Document Released: 06/17/2000 Document Revised: 09/12/2011 Document Reviewed: 02/04/2008 [facility] Patient Information 2014 [place], [place].  _______________________________________________________________________

## 2021-03-04 ENCOUNTER — Encounter (HOSPITAL_COMMUNITY): Payer: Self-pay

## 2021-03-04 ENCOUNTER — Other Ambulatory Visit: Payer: Self-pay

## 2021-03-04 ENCOUNTER — Encounter (HOSPITAL_COMMUNITY)
Admission: RE | Admit: 2021-03-04 | Discharge: 2021-03-04 | Disposition: A | Discharge status: Home / Self Care | Payer: Medicare HMO | Source: Ambulatory Visit | Attending: Specialist | Admitting: Specialist

## 2021-03-04 DIAGNOSIS — Z79899 Other long term (current) drug therapy: Secondary | ICD-10-CM | POA: Insufficient documentation

## 2021-03-04 DIAGNOSIS — Z7982 Long term (current) use of aspirin: Secondary | ICD-10-CM | POA: Diagnosis not present

## 2021-03-04 DIAGNOSIS — J449 Chronic obstructive pulmonary disease, unspecified: Secondary | ICD-10-CM | POA: Insufficient documentation

## 2021-03-04 DIAGNOSIS — Z792 Long term (current) use of antibiotics: Secondary | ICD-10-CM | POA: Diagnosis not present

## 2021-03-04 DIAGNOSIS — Z01812 Encounter for preprocedural laboratory examination: Secondary | ICD-10-CM | POA: Diagnosis not present

## 2021-03-04 DIAGNOSIS — I10 Essential (primary) hypertension: Secondary | ICD-10-CM | POA: Insufficient documentation

## 2021-03-04 DIAGNOSIS — M1711 Unilateral primary osteoarthritis, right knee: Secondary | ICD-10-CM | POA: Diagnosis not present

## 2021-03-04 DIAGNOSIS — Z7901 Long term (current) use of anticoagulants: Secondary | ICD-10-CM | POA: Insufficient documentation

## 2021-03-04 DIAGNOSIS — Z87891 Personal history of nicotine dependence: Secondary | ICD-10-CM | POA: Insufficient documentation

## 2021-03-04 DIAGNOSIS — I2699 Other pulmonary embolism without acute cor pulmonale: Secondary | ICD-10-CM | POA: Insufficient documentation

## 2021-03-04 HISTORY — DX: Cardiac arrhythmia, unspecified: I49.9

## 2021-03-04 HISTORY — DX: Unspecified osteoarthritis, unspecified site: M19.90

## 2021-03-04 LAB — SURGICAL PCR SCREEN
MRSA, PCR: NEGATIVE
Staphylococcus aureus: NEGATIVE

## 2021-03-04 LAB — COMPREHENSIVE METABOLIC PANEL
ALT: 45 U/L — ABNORMAL HIGH (ref 0–44)
AST: 49 U/L — ABNORMAL HIGH (ref 15–41)
Albumin: 3.8 g/dL (ref 3.5–5.0)
Alkaline Phosphatase: 47 U/L (ref 38–126)
Anion gap: 7 (ref 5–15)
BUN: 15 mg/dL (ref 8–23)
CO2: 26 mmol/L (ref 22–32)
Calcium: 9.5 mg/dL (ref 8.9–10.3)
Chloride: 106 mmol/L (ref 98–111)
Creatinine, Ser: 1.03 mg/dL (ref 0.61–1.24)
GFR, Estimated: >60 mL/min (ref >60)
Glucose, Bld: 94 mg/dL (ref 70–99)
Potassium: 5.2 mmol/L — ABNORMAL HIGH (ref 3.5–5.1)
Sodium: 139 mmol/L (ref 135–145)
Total Bilirubin: 1.1 mg/dL (ref 0.3–1.2)
Total Protein: 7.6 g/dL (ref 6.5–8.1)

## 2021-03-04 LAB — TYPE AND SCREEN
ABO/RH(D): A POS
Antibody Screen: NEGATIVE
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
## 2021-03-09 ENCOUNTER — Ambulatory Visit: Payer: Medicare HMO | Admitting: Cardiology

## 2021-03-09 ENCOUNTER — Encounter: Payer: Self-pay | Admitting: Cardiology

## 2021-03-09 ENCOUNTER — Other Ambulatory Visit: Payer: Self-pay

## 2021-03-09 VITALS — BP 130/78 | HR 67 | Ht 70.5 in | Wt 223.1 lb

## 2021-03-09 DIAGNOSIS — R011 Cardiac murmur, unspecified: Secondary | ICD-10-CM | POA: Diagnosis not present

## 2021-03-09 DIAGNOSIS — Z0000 Encounter for general adult medical examination without abnormal findings: Secondary | ICD-10-CM | POA: Insufficient documentation

## 2021-03-09 DIAGNOSIS — R7303 Prediabetes: Secondary | ICD-10-CM

## 2021-03-09 DIAGNOSIS — E1129 Type 2 diabetes mellitus with other diabetic kidney complication: Secondary | ICD-10-CM | POA: Insufficient documentation

## 2021-03-09 DIAGNOSIS — E669 Obesity, unspecified: Secondary | ICD-10-CM

## 2021-03-09 DIAGNOSIS — E118 Type 2 diabetes mellitus with unspecified complications: Secondary | ICD-10-CM | POA: Insufficient documentation

## 2021-03-09 DIAGNOSIS — I10 Essential (primary) hypertension: Secondary | ICD-10-CM

## 2021-03-09 DIAGNOSIS — Z01810 Encounter for preprocedural cardiovascular examination: Secondary | ICD-10-CM

## 2021-03-09 DIAGNOSIS — R739 Hyperglycemia, unspecified: Secondary | ICD-10-CM | POA: Insufficient documentation

## 2021-03-09 NOTE — Patient Instructions (Signed)
Medication Instructions:  Your physician recommends that you continue on your current medications as directed. Please refer to the Current Medication list given to you today.  *If you need a refill on your cardiac medications before your next appointment, please call your pharmacy*   Lab Work: None If you have labs (blood work) drawn today and your tests are completely normal, you will receive your results only by: [place] (if you have MyChart) OR A paper copy in the mail If you have any lab test that is abnormal or we need to change your treatment, we will call you to review the results.   Testing/Procedures: None   Follow-Up: At [facility], you and your health needs are our priority.  As part of our continuing mission to provide you with exceptional heart care, we have created designated Provider Care Teams.  These Care Teams include your primary Cardiologist (physician) and Advanced Practice Providers (APPs -  Physician Assistants and Nurse Practitioners) who all work together to provide you with the care you need, when you need it.  We recommend signing up for the patient portal called "MyChart".  Sign up information is provided on this After Visit Summary.  MyChart is used to connect with patients for Virtual Visits (Telemedicine).  Patients are able to view lab/test results, encounter notes, upcoming appointments, etc.  Non-urgent messages can be sent to your provider as well.   To learn more about what you can do with MyChart, go to [email or web address].    Your next appointment:   1 year(s)  The format for your next appointment:   In Person  Provider:   Berniece Salines, DO [address], [place], [place] [address]    Other Instructions  Deep Simerson is cleared for knee surgery from a cardiac standpoint.
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
## 2021-03-15 ENCOUNTER — Other Ambulatory Visit: Payer: Self-pay | Admitting: Specialist

## 2021-03-16 DIAGNOSIS — Z86711 Personal history of pulmonary embolism: Secondary | ICD-10-CM | POA: Diagnosis not present

## 2021-03-16 DIAGNOSIS — Z5181 Encounter for therapeutic drug level monitoring: Secondary | ICD-10-CM | POA: Diagnosis not present

## 2021-03-16 DIAGNOSIS — Z7901 Long term (current) use of anticoagulants: Secondary | ICD-10-CM | POA: Diagnosis not present

## 2021-03-16 DIAGNOSIS — R791 Abnormal coagulation profile: Secondary | ICD-10-CM | POA: Diagnosis not present

## 2021-03-16 DIAGNOSIS — I82502 Chronic embolism and thrombosis of unspecified deep veins of left lower extremity: Secondary | ICD-10-CM | POA: Diagnosis not present

## 2021-03-16 LAB — SARS CORONAVIRUS 2 (TAT 6-24 HRS): SARS Coronavirus 2: NEGATIVE

## 2021-03-17 ENCOUNTER — Encounter (HOSPITAL_COMMUNITY): Payer: Self-pay | Admitting: Specialist

## 2021-03-17 ENCOUNTER — Observation Stay (HOSPITAL_COMMUNITY)
Admission: RE | Admit: 2021-03-17 | Discharge: 2021-03-18 | Disposition: A | Discharge status: Home / Self Care | Payer: Medicare HMO | Source: Ambulatory Visit | Attending: Specialist | Admitting: Specialist

## 2021-03-17 ENCOUNTER — Other Ambulatory Visit: Payer: Self-pay | Admitting: Family Medicine

## 2021-03-17 ENCOUNTER — Other Ambulatory Visit: Payer: Self-pay

## 2021-03-17 ENCOUNTER — Ambulatory Visit (HOSPITAL_COMMUNITY): Payer: Medicare HMO | Admitting: Registered Nurse

## 2021-03-17 ENCOUNTER — Encounter (HOSPITAL_COMMUNITY)
Admission: RE | Disposition: A | Discharge status: Home / Self Care | Payer: Self-pay | Source: Ambulatory Visit | Attending: Specialist

## 2021-03-17 ENCOUNTER — Ambulatory Visit (HOSPITAL_COMMUNITY): Payer: Medicare HMO | Admitting: Physician Assistant

## 2021-03-17 DIAGNOSIS — G8918 Other acute postprocedural pain: Secondary | ICD-10-CM | POA: Diagnosis not present

## 2021-03-17 DIAGNOSIS — R7303 Prediabetes: Secondary | ICD-10-CM | POA: Insufficient documentation

## 2021-03-17 DIAGNOSIS — Z8616 Personal history of COVID-19: Secondary | ICD-10-CM | POA: Diagnosis not present

## 2021-03-17 DIAGNOSIS — I10 Essential (primary) hypertension: Secondary | ICD-10-CM | POA: Diagnosis not present

## 2021-03-17 DIAGNOSIS — M1711 Unilateral primary osteoarthritis, right knee: Principal | ICD-10-CM | POA: Diagnosis present

## 2021-03-17 DIAGNOSIS — E7800 Pure hypercholesterolemia, unspecified: Secondary | ICD-10-CM | POA: Diagnosis not present

## 2021-03-17 DIAGNOSIS — Z87891 Personal history of nicotine dependence: Secondary | ICD-10-CM | POA: Insufficient documentation

## 2021-03-17 DIAGNOSIS — J449 Chronic obstructive pulmonary disease, unspecified: Secondary | ICD-10-CM | POA: Diagnosis not present

## 2021-03-17 DIAGNOSIS — Z8551 Personal history of malignant neoplasm of bladder: Secondary | ICD-10-CM | POA: Insufficient documentation

## 2021-03-17 HISTORY — PX: TOTAL KNEE ARTHROPLASTY: SHX125

## 2021-03-17 LAB — CBC
HCT: 40.6 % (ref 39.0–52.0)
HCT: 38.8 % — ABNORMAL LOW (ref 39.0–52.0)
Hemoglobin: 13.2 g/dL (ref 13.0–17.0)
Hemoglobin: 12.7 g/dL — ABNORMAL LOW (ref 13.0–17.0)
MCH: 28 pg (ref 26.0–34.0)
MCH: 27.9 pg (ref 26.0–34.0)
MCHC: 32.5 g/dL (ref 30.0–36.0)
MCHC: 32.7 g/dL (ref 30.0–36.0)
MCV: 86 fL (ref 80.0–100.0)
MCV: 85.3 fL (ref 80.0–100.0)
Platelets: 191 10*3/uL (ref 150–400)
Platelets: 218 10*3/uL (ref 150–400)
RBC: 4.72 MIL/uL (ref 4.22–5.81)
RBC: 4.55 MIL/uL (ref 4.22–5.81)
RDW: 15.7 % — ABNORMAL HIGH (ref 11.5–15.5)
RDW: 15.8 % — ABNORMAL HIGH (ref 11.5–15.5)
WBC: 3.7 10*3/uL — ABNORMAL LOW (ref 4.0–10.5)
WBC: 11.4 10*3/uL — ABNORMAL HIGH (ref 4.0–10.5)
nRBC: 0 % (ref 0.0–0.2)
nRBC: 0 % (ref 0.0–0.2)

## 2021-03-17 LAB — CREATININE, SERUM
Creatinine, Ser: 1.2 mg/dL (ref 0.61–1.24)
GFR, Estimated: >60 mL/min (ref >60)

## 2021-03-17 SURGERY — ARTHROPLASTY, KNEE, TOTAL
Anesthesia: Spinal | Site: Knee | Laterality: Right

## 2021-03-17 MED ORDER — ONDANSETRON HCL 4 MG/2ML IJ SOLN
INTRAMUSCULAR | Status: AC
  Filled 2021-03-17: qty 2

## 2021-03-17 MED ORDER — COLCHICINE 0.6 MG PO TABS
0.6000 mg | ORAL_TABLET | Freq: Every day | ORAL | Status: DC
  Administered 2021-03-18: 0.6 mg via ORAL
  Filled 2021-03-17: qty 1

## 2021-03-17 MED ORDER — MIDAZOLAM HCL 2 MG/2ML IJ SOLN
INTRAMUSCULAR | Status: AC
  Filled 2021-03-17: qty 2

## 2021-03-17 MED ORDER — AMLODIPINE BESYLATE 10 MG PO TABS
10.0000 mg | ORAL_TABLET | Freq: Every day | ORAL | Status: DC
  Administered 2021-03-18: 10 mg via ORAL
  Filled 2021-03-17: qty 1

## 2021-03-17 MED ORDER — TRANEXAMIC ACID-NACL 1000-0.7 MG/100ML-% IV SOLN
INTRAVENOUS | Status: DC | PRN
  Administered 2021-03-17: 1000 mg via INTRAVENOUS

## 2021-03-17 MED ORDER — DEXAMETHASONE SODIUM PHOSPHATE 10 MG/ML IJ SOLN
INTRAMUSCULAR | Status: AC
  Filled 2021-03-17: qty 1

## 2021-03-17 MED ORDER — PROPOFOL 10 MG/ML IV BOLUS
INTRAVENOUS | Status: DC | PRN
Start: 2021-03-17 — End: 2021-03-17
  Administered 2021-03-17: 200 mg via INTRAVENOUS

## 2021-03-17 MED ORDER — IRBESARTAN 150 MG PO TABS
300.0000 mg | ORAL_TABLET | Freq: Every day | ORAL | Status: DC
  Administered 2021-03-17 – 2021-03-18 (×2): 300 mg via ORAL
  Filled 2021-03-17 (×2): qty 2

## 2021-03-17 MED ORDER — PHENYLEPHRINE 40 MCG/ML (10ML) SYRINGE FOR IV PUSH (FOR BLOOD PRESSURE SUPPORT)
PREFILLED_SYRINGE | INTRAVENOUS | Status: AC
  Filled 2021-03-17: qty 10

## 2021-03-17 MED ORDER — SODIUM CHLORIDE 0.9 % IV SOLN: INTRAVENOUS | Status: DC

## 2021-03-17 MED ORDER — SODIUM CHLORIDE (PF) 0.9 % IJ SOLN
INTRAMUSCULAR | Status: AC
  Filled 2021-03-17: qty 10

## 2021-03-17 MED ORDER — HYDROCHLOROTHIAZIDE 25 MG PO TABS
25.0000 mg | ORAL_TABLET | Freq: Every day | ORAL | Status: DC
  Administered 2021-03-17 – 2021-03-18 (×2): 25 mg via ORAL
  Filled 2021-03-17 (×2): qty 1

## 2021-03-17 MED ORDER — METHOCARBAMOL 500 MG IVPB - SIMPLE MED
INTRAVENOUS | Status: AC
  Filled 2021-03-17: qty 50

## 2021-03-17 MED ORDER — HYDROMORPHONE HCL 1 MG/ML IJ SOLN: 1.0000 mg | INTRAMUSCULAR | Status: DC | PRN

## 2021-03-17 MED ORDER — LACTATED RINGERS IV SOLN: INTRAVENOUS | Status: DC

## 2021-03-17 MED ORDER — ACETAMINOPHEN 10 MG/ML IV SOLN
1000.0000 mg | Freq: Once | INTRAVENOUS | Status: DC | PRN
  Administered 2021-03-17: 1000 mg via INTRAVENOUS

## 2021-03-17 MED ORDER — ACETAMINOPHEN 10 MG/ML IV SOLN
INTRAVENOUS | Status: AC
  Filled 2021-03-17: qty 100

## 2021-03-17 MED ORDER — IRRISEPT - 450ML BOTTLE WITH 0.05% CHG IN STERILE WATER, USP 99.95% OPTIME
TOPICAL | Status: DC | PRN
  Administered 2021-03-17: 450 mL via TOPICAL

## 2021-03-17 MED ORDER — ONDANSETRON HCL 4 MG/2ML IJ SOLN: 4.0000 mg | Freq: Once | INTRAMUSCULAR | Status: DC | PRN

## 2021-03-17 MED ORDER — HYDROMORPHONE HCL 1 MG/ML IJ SOLN
0.2500 mg | INTRAMUSCULAR | Status: DC | PRN
  Administered 2021-03-17 (×2): 0.5 mg via INTRAVENOUS

## 2021-03-17 MED ORDER — ORAL CARE MOUTH RINSE: 15.0000 mL | Freq: Once | OROMUCOSAL | Status: AC

## 2021-03-17 MED ORDER — POVIDONE-IODINE 10 % EX SWAB
2.0000 "application " | Freq: Once | CUTANEOUS | Status: AC
  Administered 2021-03-17: 2 via TOPICAL

## 2021-03-17 MED ORDER — ROPIVACAINE HCL 5 MG/ML IJ SOLN
INTRAMUSCULAR | Status: DC | PRN
  Administered 2021-03-17: 30 mL via PERINEURAL

## 2021-03-17 MED ORDER — ONDANSETRON HCL 4 MG/2ML IJ SOLN
4.0000 mg | Freq: Four times a day (QID) | INTRAMUSCULAR | Status: DC | PRN
  Administered 2021-03-17: 4 mg via INTRAVENOUS
  Filled 2021-03-17: qty 2

## 2021-03-17 MED ORDER — CHLORHEXIDINE GLUCONATE 0.12 % MT SOLN
15.0000 mL | Freq: Once | OROMUCOSAL | Status: AC
  Administered 2021-03-17: 15 mL via OROMUCOSAL

## 2021-03-17 MED ORDER — MIDAZOLAM HCL 5 MG/5ML IJ SOLN
INTRAMUSCULAR | Status: DC | PRN
Start: 2021-03-17 — End: 2021-03-17
  Administered 2021-03-17: 1 mg via INTRAVENOUS

## 2021-03-17 MED ORDER — METHOCARBAMOL 500 MG IVPB - SIMPLE MED
500.0000 mg | Freq: Four times a day (QID) | INTRAVENOUS | Status: DC | PRN
  Administered 2021-03-17: 500 mg via INTRAVENOUS
  Filled 2021-03-17: qty 50

## 2021-03-17 MED ORDER — DIPHENHYDRAMINE HCL 12.5 MG/5ML PO ELIX: 12.5000 mg | ORAL_SOLUTION | ORAL | Status: DC | PRN

## 2021-03-17 MED ORDER — FENTANYL CITRATE (PF) 100 MCG/2ML IJ SOLN
INTRAMUSCULAR | Status: AC
  Filled 2021-03-17: qty 2

## 2021-03-17 MED ORDER — ARFORMOTEROL TARTRATE 15 MCG/2ML IN NEBU
15.0000 ug | INHALATION_SOLUTION | Freq: Two times a day (BID) | RESPIRATORY_TRACT | Status: DC
  Administered 2021-03-17 – 2021-03-18 (×2): 15 ug via RESPIRATORY_TRACT
  Filled 2021-03-17 (×2): qty 2

## 2021-03-17 MED ORDER — FENTANYL CITRATE (PF) 250 MCG/5ML IJ SOLN
INTRAMUSCULAR | Status: AC
  Filled 2021-03-17: qty 5

## 2021-03-17 MED ORDER — KETOROLAC TROMETHAMINE 15 MG/ML IJ SOLN: 7.5000 mg | Freq: Four times a day (QID) | INTRAMUSCULAR | Status: DC | PRN

## 2021-03-17 MED ORDER — BUPIVACAINE LIPOSOME 1.3 % IJ SUSP: 20.0000 mL | Freq: Once | INTRAMUSCULAR | Status: DC

## 2021-03-17 MED ORDER — STERILE WATER FOR IRRIGATION IR SOLN
Status: DC | PRN
  Administered 2021-03-17: 2000 mL

## 2021-03-17 MED ORDER — OLMESARTAN MEDOXOMIL-HCTZ 40-25 MG PO TABS: 1.0000 | ORAL_TABLET | Freq: Every day | ORAL | Status: DC

## 2021-03-17 MED ORDER — SODIUM CHLORIDE 0.9 % IV SOLN: INTRAVENOUS | Status: DC | PRN

## 2021-03-17 MED ORDER — ALBUTEROL SULFATE (2.5 MG/3ML) 0.083% IN NEBU: 3.0000 mL | INHALATION_SOLUTION | Freq: Four times a day (QID) | RESPIRATORY_TRACT | Status: DC | PRN

## 2021-03-17 MED ORDER — BUPIVACAINE LIPOSOME 1.3 % IJ SUSP: INTRAMUSCULAR | Status: DC | PRN

## 2021-03-17 MED ORDER — HYDROMORPHONE HCL 1 MG/ML IJ SOLN
INTRAMUSCULAR | Status: DC | PRN
  Administered 2021-03-17: 1 mg via INTRAVENOUS

## 2021-03-17 MED ORDER — BUPIVACAINE LIPOSOME 1.3 % IJ SUSP
INTRAMUSCULAR | Status: AC
  Filled 2021-03-17: qty 20

## 2021-03-17 MED ORDER — ONDANSETRON HCL 4 MG PO TABS: 4.0000 mg | ORAL_TABLET | Freq: Four times a day (QID) | ORAL | Status: DC | PRN

## 2021-03-17 MED ORDER — FENTANYL CITRATE (PF) 100 MCG/2ML IJ SOLN
INTRAMUSCULAR | Status: DC | PRN
  Administered 2021-03-17: 75 ug via INTRAVENOUS
  Administered 2021-03-17 (×3): 50 ug via INTRAVENOUS
  Administered 2021-03-17: 25 ug via INTRAVENOUS
  Administered 2021-03-17 (×2): 50 ug via INTRAVENOUS

## 2021-03-17 MED ORDER — SODIUM CHLORIDE 0.9 % IR SOLN
Status: DC | PRN
  Administered 2021-03-17: 1000 mL

## 2021-03-17 MED ORDER — HYDROMORPHONE HCL 1 MG/ML IJ SOLN
INTRAMUSCULAR | Status: AC
  Filled 2021-03-17: qty 1

## 2021-03-17 MED ORDER — UMECLIDINIUM BROMIDE 62.5 MCG/INH IN AEPB
1.0000 | INHALATION_SPRAY | Freq: Every day | RESPIRATORY_TRACT | Status: DC
  Administered 2021-03-18: 1 via RESPIRATORY_TRACT
  Filled 2021-03-17: qty 7

## 2021-03-17 MED ORDER — METHOCARBAMOL 500 MG PO TABS
500.0000 mg | ORAL_TABLET | Freq: Four times a day (QID) | ORAL | Status: DC | PRN
  Administered 2021-03-17 – 2021-03-18 (×3): 500 mg via ORAL
  Filled 2021-03-17 (×3): qty 1

## 2021-03-17 MED ORDER — DEXAMETHASONE SODIUM PHOSPHATE 10 MG/ML IJ SOLN
8.0000 mg | Freq: Once | INTRAMUSCULAR | Status: AC
  Administered 2021-03-17: 10 mg via INTRAVENOUS

## 2021-03-17 MED ORDER — ONDANSETRON HCL 4 MG/2ML IJ SOLN
INTRAMUSCULAR | Status: DC | PRN
Start: 2021-03-17 — End: 2021-03-17
  Administered 2021-03-17: 4 mg via INTRAVENOUS

## 2021-03-17 MED ORDER — BUPIVACAINE HCL (PF) 0.5 % IJ SOLN: INTRAMUSCULAR | Status: DC | PRN

## 2021-03-17 MED ORDER — TRANEXAMIC ACID-NACL 1000-0.7 MG/100ML-% IV SOLN
INTRAVENOUS | Status: AC
  Filled 2021-03-17: qty 100

## 2021-03-17 MED ORDER — ISOSORBIDE MONONITRATE ER 30 MG PO TB24
30.0000 mg | ORAL_TABLET | Freq: Every day | ORAL | Status: DC
  Administered 2021-03-18: 30 mg via ORAL
  Filled 2021-03-17: qty 1

## 2021-03-17 MED ORDER — PHENYLEPHRINE 40 MCG/ML (10ML) SYRINGE FOR IV PUSH (FOR BLOOD PRESSURE SUPPORT)
PREFILLED_SYRINGE | INTRAVENOUS | Status: DC | PRN
  Administered 2021-03-17 (×2): 120 ug via INTRAVENOUS
  Administered 2021-03-17: 80 ug via INTRAVENOUS

## 2021-03-17 MED ORDER — CEFAZOLIN SODIUM-DEXTROSE 1-4 GM/50ML-% IV SOLN
1.0000 g | Freq: Three times a day (TID) | INTRAVENOUS | Status: AC
  Administered 2021-03-17 – 2021-03-18 (×3): 1 g via INTRAVENOUS
  Filled 2021-03-17 (×3): qty 50

## 2021-03-17 MED ORDER — LIDOCAINE 2% (20 MG/ML) 5 ML SYRINGE
INTRAMUSCULAR | Status: DC | PRN
  Administered 2021-03-17: 100 mg via INTRAVENOUS

## 2021-03-17 MED ORDER — TAMSULOSIN HCL 0.4 MG PO CAPS
0.4000 mg | ORAL_CAPSULE | Freq: Every day | ORAL | Status: DC
  Administered 2021-03-17: 0.4 mg via ORAL
  Filled 2021-03-17: qty 1

## 2021-03-17 MED ORDER — EPHEDRINE 5 MG/ML INJ
INTRAVENOUS | Status: AC
  Filled 2021-03-17: qty 5

## 2021-03-17 MED ORDER — PROPOFOL 10 MG/ML IV BOLUS
INTRAVENOUS | Status: AC
  Filled 2021-03-17: qty 20

## 2021-03-17 MED ORDER — SENNOSIDES-DOCUSATE SODIUM 8.6-50 MG PO TABS: 1.0000 | ORAL_TABLET | Freq: Every evening | ORAL | Status: DC | PRN

## 2021-03-17 MED ORDER — 0.9 % SODIUM CHLORIDE (POUR BTL) OPTIME
TOPICAL | Status: DC | PRN
  Administered 2021-03-17: 1000 mL

## 2021-03-17 MED ORDER — PANTOPRAZOLE SODIUM 40 MG PO TBEC
40.0000 mg | DELAYED_RELEASE_TABLET | Freq: Every day | ORAL | Status: DC
  Administered 2021-03-18: 40 mg via ORAL
  Filled 2021-03-17: qty 1

## 2021-03-17 MED ORDER — ROSUVASTATIN CALCIUM 20 MG PO TABS
20.0000 mg | ORAL_TABLET | Freq: Every day | ORAL | Status: DC
  Administered 2021-03-18: 20 mg via ORAL
  Filled 2021-03-17: qty 1

## 2021-03-17 MED ORDER — ENOXAPARIN SODIUM 40 MG/0.4ML IJ SOSY
40.0000 mg | PREFILLED_SYRINGE | INTRAMUSCULAR | Status: DC
  Administered 2021-03-18: 40 mg via SUBCUTANEOUS
  Filled 2021-03-17: qty 0.4

## 2021-03-17 MED ORDER — CEFAZOLIN SODIUM-DEXTROSE 2-4 GM/100ML-% IV SOLN
2.0000 g | INTRAVENOUS | Status: AC
  Administered 2021-03-17: 2 g via INTRAVENOUS
  Filled 2021-03-17: qty 100

## 2021-03-17 MED ORDER — BISACODYL 5 MG PO TBEC: 5.0000 mg | DELAYED_RELEASE_TABLET | Freq: Every day | ORAL | Status: DC | PRN

## 2021-03-17 MED ORDER — PROPOFOL 1000 MG/100ML IV EMUL
INTRAVENOUS | Status: AC
  Filled 2021-03-17: qty 100

## 2021-03-17 MED ORDER — OXYCODONE HCL 5 MG PO TABS
5.0000 mg | ORAL_TABLET | ORAL | Status: DC | PRN
  Administered 2021-03-17 – 2021-03-18 (×4): 10 mg via ORAL
  Filled 2021-03-17 (×4): qty 2

## 2021-03-17 MED ORDER — HYDROMORPHONE HCL 2 MG/ML IJ SOLN
INTRAMUSCULAR | Status: AC
  Filled 2021-03-17: qty 1

## 2021-03-17 SURGICAL SUPPLY — 60 items
ADH SKN CLS APL DERMABOND .7 (GAUZE/BANDAGES/DRESSINGS) ×1
ATTUNE MED DOME PAT 41 KNEE (Knees) ×1 IMPLANT
ATTUNE PS FEM RT SZ 6 CEM KNEE (Femur) ×1 IMPLANT
ATTUNE PSRP INSR SZ6 6 KNEE (Insert) ×1 IMPLANT
BAG COUNTER SPONGE SURGICOUNT (BAG) ×2 IMPLANT
BAG SPEC THK2 15X12 ZIP CLS (MISCELLANEOUS) ×1
BAG SPNG CNTER NS LX DISP (BAG) ×1
BAG ZIPLOCK 12X15 (MISCELLANEOUS) ×2 IMPLANT
BASE TIBIAL ROT PLAT SZ 8 KNEE (Knees) IMPLANT
BLADE SAG 18X100X1.27 (BLADE) ×2 IMPLANT
BLADE SAW SGTL 11.0X1.19X90.0M (BLADE) ×2 IMPLANT
BNDG ELASTIC 4X5.8 VLCR STR LF (GAUZE/BANDAGES/DRESSINGS) ×2 IMPLANT
BNDG ELASTIC 6X5.8 VLCR STR LF (GAUZE/BANDAGES/DRESSINGS) ×2 IMPLANT
BOWL SMART MIX CTS (DISPOSABLE) ×2 IMPLANT
BSPLAT TIB 8 CMNT ROT PLAT STR (Knees) ×1 IMPLANT
CEMENT HV SMART SET (Cement) ×2 IMPLANT
COVER SURGICAL LIGHT HANDLE (MISCELLANEOUS) ×2 IMPLANT
CUFF TOURN SGL QUICK 34 (TOURNIQUET CUFF) ×2
CUFF TRNQT CYL 34X4.125X (TOURNIQUET CUFF) ×1 IMPLANT
DECANTER SPIKE VIAL GLASS SM (MISCELLANEOUS) ×2 IMPLANT
DERMABOND ADVANCED (GAUZE/BANDAGES/DRESSINGS) ×1
DERMABOND ADVANCED .7 DNX12 (GAUZE/BANDAGES/DRESSINGS) ×1 IMPLANT
DRAPE INCISE IOBAN 66X45 STRL (DRAPES) ×6 IMPLANT
DRAPE U-SHAPE 47X51 STRL (DRAPES) ×2 IMPLANT
DRSG AQUACEL AG ADV 3.5X10 (GAUZE/BANDAGES/DRESSINGS) ×2 IMPLANT
DURAPREP 26ML APPLICATOR (WOUND CARE) ×4 IMPLANT
ELECT REM PT RETURN 15FT ADLT (MISCELLANEOUS) ×2 IMPLANT
GLOVE SRG 8 PF TXTR STRL LF DI (GLOVE) ×1 IMPLANT
GLOVE SURG NEOPR MICRO LF SZ8 (GLOVE) ×2 IMPLANT
GLOVE SURG ORTHO LTX SZ9 (GLOVE) ×2 IMPLANT
GLOVE SURG POLYISO LF SZ7 (GLOVE) ×2 IMPLANT
GLOVE SURG UNDER POLY LF SZ7.5 (GLOVE) ×2 IMPLANT
GLOVE SURG UNDER POLY LF SZ8 (GLOVE) ×2
GOWN STRL REUS W/TWL XL LVL3 (GOWN DISPOSABLE) ×4 IMPLANT
HANDPIECE INTERPULSE COAX TIP (DISPOSABLE) ×2
IRRIGATION SURGIPHOR STRL (IV SOLUTION) ×2 IMPLANT
KIT TURNOVER KIT A (KITS) ×2 IMPLANT
NS IRRIG 1000ML POUR BTL (IV SOLUTION) ×2 IMPLANT
PACK TOTAL KNEE CUSTOM (KITS) ×2 IMPLANT
PIN DRILL FIX HALF THREAD (BIT) ×1 IMPLANT
PIN STEINMAN FIXATION KNEE (PIN) ×1 IMPLANT
PROTECTOR NERVE ULNAR (MISCELLANEOUS) ×2 IMPLANT
SET HNDPC FAN SPRY TIP SCT (DISPOSABLE) ×1 IMPLANT
SET PAD KNEE POSITIONER (MISCELLANEOUS) ×2 IMPLANT
SPONGE SURGIFOAM ABS GEL 100 (HEMOSTASIS) ×2 IMPLANT
SPONGE T-LAP 18X18 ~~LOC~~+RFID (SPONGE) IMPLANT
STOCKINETTE 6  STRL (DRAPES) ×2
STOCKINETTE 6 STRL (DRAPES) ×1 IMPLANT
SUT MNCRL AB 3-0 PS2 18 (SUTURE) ×2 IMPLANT
SUT VIC AB 1 CT1 27 (SUTURE) ×6
SUT VIC AB 1 CT1 27XBRD ANTBC (SUTURE) ×3 IMPLANT
SUT VIC AB 2-0 CT1 27 (SUTURE) ×4
SUT VIC AB 2-0 CT1 TAPERPNT 27 (SUTURE) ×2 IMPLANT
SUT VLOC 180 0 24IN GS25 (SUTURE) ×2 IMPLANT
SYR 50ML LL SCALE MARK (SYRINGE) ×2 IMPLANT
TAPE STRIPS DRAPE STRL (GAUZE/BANDAGES/DRESSINGS) ×2 IMPLANT
TIBIAL BASE ROT PLAT SZ 8 KNEE (Knees) ×2 IMPLANT
TRAY CATH INTERMITTENT SS 16FR (CATHETERS) ×2 IMPLANT
WATER STERILE IRR 1000ML POUR (IV SOLUTION) ×4 IMPLANT
WRAP KNEE MAXI GEL POST OP (GAUZE/BANDAGES/DRESSINGS) ×2 IMPLANT

## 2021-03-17 NOTE — Evaluation (Signed)
Physical Therapy Evaluation Patient Details Name: Victor Cooper MRN: [id number] DOB: May 21, 1941 Today's Date: 03/17/2021  History of Present Illness  Patient is 80 y.o. male s/p Rt TKA on 03/17/21 with PMH significant for OA, anemia, bladder cancer, COPD, HTN, PE, hearing loss in Lt ear.    Clinical Impression  JAHZIR CORDIAL is a 80 y.o. male POD 0 s/p Rt TKA. Patient reports independence with mobility at baseline. Patient is now limited by functional impairments (see PT problem list below) and requires min assist for transfers and gait with RW. Patient was able to ambulate ~35 feet with RW and min assist. Patient instructed in exercise to facilitate circulation to manage edema and reduce risk of DVT. Patient will benefit from continued skilled PT interventions to address impairments and progress towards PLOF. Acute PT will follow to progress mobility and stair training in preparation for safe discharge home.        Recommendations for follow up therapy are one component of a multi-disciplinary discharge planning process, led by the attending physician.  Recommendations may be updated based on patient status, additional functional criteria and insurance authorization.  Follow Up Recommendations Follow surgeon's recommendation for DC plan and follow-up therapies;Home health PT    Equipment Recommendations  Rolling walker with 5" wheels;3in1 (PT)    Recommendations for Other Services       Precautions / Restrictions Precautions Precautions: Fall Restrictions Weight Bearing Restrictions: No Other Position/Activity Restrictions: WABT      Mobility  Bed Mobility Overal bed mobility: Needs Assistance Bed Mobility: Supine to Sit     Supine to sit: Min assist;HOB elevated     General bed mobility comments: cues for use of bed rail and assist to bring Rt LE off EOB.    Transfers Overall transfer level: Needs assistance Equipment used: Rolling walker (2 wheeled) Transfers: Sit  to/from Stand Sit to Stand: Min assist;From elevated surface         General transfer comment: cues for hand placement and light assist to initiate and steady with rise from EOB.  Ambulation/Gait Ambulation/Gait assistance: Min assist Gait Distance (Feet): 35 Feet Assistive device: Rolling walker (2 wheeled) Gait Pattern/deviations: Step-to pattern;Decreased stride length;Decreased weight shift to right Gait velocity: decr   General Gait Details: cues for step to pattern and proximity to RW, no buckling at Rt knee and no LOB noted. distance limited as pt needed to void bladder.  Stairs            Wheelchair Mobility    Modified Rankin (Stroke Patients Only)       Balance Overall balance assessment: Needs assistance Sitting-balance support: Feet supported Sitting balance-Leahy Scale: Good     Standing balance support: During functional activity;Bilateral upper extremity supported Standing balance-Leahy Scale: Fair Standing balance comment: pt able to stand and void with urinal with no UE support for balance                             Pertinent Vitals/Pain Pain Assessment: 0-10 Pain Score: 1  Pain Location: Rt knee Pain Descriptors / Indicators: Aching;Discomfort Pain Intervention(s): Limited activity within patient's tolerance;Monitored during session;Repositioned    Home Living Family/patient expects to be discharged to:: Private residence Living Arrangements: Spouse/significant other;Children;Other relatives Available Help at Discharge: Family Type of Home: House Home Access: Stairs to enter Entrance Stairs-Rails: None Entrance Stairs-Number of Steps: 1+1 Home Layout: One level Home Equipment: Environmental consultant - 2 wheels;Cane - single point;Toilet  riser;Shower seat      Prior Function Level of Independence: Independent               Hand Dominance   Dominant Hand: Right    Extremity/Trunk Assessment   Upper Extremity Assessment Upper  Extremity Assessment: Overall WFL for tasks assessed    Lower Extremity Assessment Lower Extremity Assessment: RLE deficits/detail RLE Deficits / Details: good quad activation, no extensor lag with SLR RLE Sensation: WNL RLE Coordination: WNL    Cervical / Trunk Assessment Cervical / Trunk Assessment: Normal  Communication   Communication: No difficulties  Cognition Arousal/Alertness: Awake/alert Behavior During Therapy: WFL for tasks assessed/performed Overall Cognitive Status: Within Functional Limits for tasks assessed                                        General Comments      Exercises Total Joint Exercises Ankle Circles/Pumps: AROM;Both;20 reps;Seated   Assessment/Plan    PT Assessment Patient needs continued PT services  PT Problem List Decreased strength;Decreased activity tolerance;Decreased range of motion;Decreased balance;Decreased mobility;Decreased knowledge of use of DME;Decreased knowledge of precautions       PT Treatment Interventions DME instruction;Gait training;Stair training;Functional mobility training;Therapeutic activities;Balance training;Therapeutic exercise;Patient/family education    PT Goals (Current goals can be found in the Care Plan section)  Acute Rehab PT Goals Patient Stated Goal: get dancing PT Goal Formulation: With patient Time For Goal Achievement: 03/24/21 Potential to Achieve Goals: Good    Frequency 7X/week   Barriers to discharge        Co-evaluation               AM-PAC PT "6 Clicks" Mobility  Outcome Measure Help needed turning from your back to your side while in a flat bed without using bedrails?: A Little Help needed moving from lying on your back to sitting on the side of a flat bed without using bedrails?: A Little Help needed moving to and from a bed to a chair (including a wheelchair)?: A Little Help needed standing up from a chair using your arms (e.g., wheelchair or bedside chair)?: A  Little Help needed to walk in hospital room?: A Little Help needed climbing 3-5 steps with a railing? : A Little 6 Click Score: 18    End of Session Equipment Utilized During Treatment: Gait belt Activity Tolerance: Patient tolerated treatment well Patient left: in chair;with call bell/phone within reach;with chair alarm set;with family/visitor present Nurse Communication: Mobility status PT Visit Diagnosis: Muscle weakness (generalized) (M62.81);Difficulty in walking, not elsewhere classified (R26.2)    Time: [id number] PT Time Calculation (min) (ACUTE ONLY): 34 min   Charges:   PT Evaluation $PT Eval Low Complexity: 1 Low PT Treatments $Gait Training: 8-22 mins        Verner Mould, DPT Acute Rehabilitation Services Office [phone] Pager [phone]   [name] 03/17/2021, 4:30 PM
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
## 2021-03-17 NOTE — Anesthesia Postprocedure Evaluation (Signed)
Anesthesia Post Note  Patient: Victor Cooper  Procedure(s) Performed: TOTAL KNEE ARTHROPLASTY (Right: Knee)     Patient location during evaluation: PACU Anesthesia Type: General Level of consciousness: awake and alert Pain management: pain level controlled Vital Signs Assessment: post-procedure vital signs reviewed and stable Respiratory status: spontaneous breathing, nonlabored ventilation, respiratory function stable and patient connected to nasal cannula oxygen Cardiovascular status: blood pressure returned to baseline and stable Postop Assessment: no apparent nausea or vomiting Anesthetic complications: no   No notable events documented.  Last Vitals:  Vitals:   03/17/21 1221 03/17/21 1230  BP:  (!) 154/83  Pulse: 75 72  Resp: 11 10  Temp:    SpO2: 97% 96%    Last Pain:  Vitals:   03/17/21 1230  TempSrc:   PainSc: 4                  [name] S

## 2021-03-17 NOTE — Care Plan (Signed)
Ortho Bundle Case Management Note  Patient Details  Name: MITHRAN WHITSON MRN: [id number] Date of Birth: 02-16-1941                  R TKA on 03/17/21. DCP: Home with wife. 1 story home with 0 steps. DME: No needs. Has RW & 3in1. PT: [facility] 9/19   DME Arranged:  N/A DME Agency:     HH Arranged:    [place] Agency:     Additional Comments: Please contact me with any questions of if this plan should need to change.  Marianne Sofia, RN,CCM [facility]  [phone] 03/17/2021, 9:20 AM

## 2021-03-17 NOTE — Op Note (Signed)
DATE OF SURGERY:  03/17/2021  TIME: 10:59 AM  PATIENT NAME:  Victor Cooper    AGE: 80 y.o.   PRE-OPERATIVE DIAGNOSIS:  Right knee osteoarthritis  POST-OPERATIVE DIAGNOSIS:  Right knee osteoarthritis  PROCEDURE:  Procedure(s): TOTAL KNEE ARTHROPLASTY  SURGEON:  Lizzeth Meder ANDREW  ASSISTANT:  Leeanne Haus, PA-C, present and scrubbed throughout the case, critical for assistance with exposure, retraction, instrumentation, and closure.  OPERATIVE IMPLANTS: Depuy PFC Attune Rotating Platform.  Femur size 6, Tibia size 8, Patella size 41 3-peg oval button, with a 6 mm polyethylene insert.   PREOPERATIVE INDICATIONS:   Victor Cooper is a 80 y.o. year old male with end stage bone on bone arthritis of the knee who failed conservative treatment and elected for Total Knee Arthroplasty.   The risks, benefits, and alternatives were discussed at length including but not limited to the risks of infection, bleeding, nerve injury, stiffness, blood clots, the need for revision surgery, cardiopulmonary complications, among others, and they were willing to proceed.  OPERATIVE DESCRIPTION:  The patient was brought to the operative room and placed in a supine position.  Spinal anesthesia was administered.  IV antibiotics were given.  The lower extremity was prepped and draped in the usual sterile fashion.  Time out was performed.  The leg was elevated and exsanguinated and the tourniquet was inflated.  Anterior quadriceps tendon splitting approach was performed.  The patella was retracted and osteophytes were removed.  The anterior horn of the medial and lateral meniscus was removed and cruciate ligaments resected.   The distal femur was opened with the drill and the intramedullary distal femoral cutting jig was utilized, set at 5 degrees resecting 10 mm off the distal femur.  Care was taken to protect the collateral ligaments.  The distal femoral sizing jig was applied, taking care to avoid  notching.  Then the 4-in-1 cutting jig was applied and the anterior and posterior femur was cut, along with the chamfer cuts.    Then the extramedullary tibial cutting jig was utilized making the appropriate cut using the anterior tibial crest as a reference building in appropriate posterior slope.  Care was taken during the cut to protect the medial and collateral ligaments.  The proximal tibia was removed along with the posterior horns of the menisci.   The posterior medial femoral osteophytes and posterior lateral femoral osteophytes were removed.    The flexion gap was then measured and was symmetric with the extension gap, measured at 6.  I completed the distal femoral preparation using the appropriate jig to prepare the box.  The patella was then measured, and cut with the saw.    The proximal tibia sized and prepared accordingly with the reamer and the punch, and then all components were trialed with the trial insert.  The knee was found to have excellent balance and full motion.    The above named components were then cemented into place and all excess cement was removed.  The trial polyethylene component was in place during cementation, and then was exchanged for the real polyethylene component.    The knee was easily taken through a range of motion and the patella tracked well and the knee irrigated copiously and the parapatellar and subcutaneous tissue closed with vicryl, and monocryl with steri strips for the skin.  The arthrotomy was closed at 90 of flexion. The wounds were dressed with sterile gauze and the tourniquet released and the patient was awakened and returned to the PACU in  stable and satisfactory condition.  There were no complications.  Total tourniquet time was 90 minutes.

## 2021-03-17 NOTE — Anesthesia Procedure Notes (Signed)
Procedure Name: LMA Insertion Date/Time: 03/17/2021 12:43 AM Performed by: Talbot Grumbling, CRNA Pre-anesthesia Checklist: Patient identified, Emergency Drugs available, Suction available and Patient being monitored Patient Re-evaluated:Patient Re-evaluated prior to induction Oxygen Delivery Method: Circle system utilized Preoxygenation: Pre-oxygenation with 100% oxygen Induction Type: IV induction Ventilation: Mask ventilation without difficulty LMA: LMA with gastric port inserted LMA Size: 5.0 Number of attempts: 1 Placement Confirmation: positive ETCO2 and breath sounds checked- equal and bilateral Tube secured with: Tape Dental Injury: Teeth and Oropharynx as per pre-operative assessment

## 2021-03-17 NOTE — Transfer of Care (Signed)
Immediate Anesthesia Transfer of Care Note  Patient: Victor Cooper  Procedure(s) Performed: TOTAL KNEE ARTHROPLASTY (Right: Knee)  Patient Location: PACU  Anesthesia Type:General  Level of Consciousness: sedated  Airway & Oxygen Therapy: Patient Spontanous Breathing and Patient connected to face mask oxygen  Post-op Assessment: Report given to RN and Post -op Vital signs reviewed and stable  Post vital signs: Reviewed and stable  Last Vitals:  Vitals Value Taken Time  BP 155/83 03/17/21 1111  Temp    Pulse 76 03/17/21 1112  Resp 12 03/17/21 1112  SpO2 100 % 03/17/21 1112  Vitals shown include unvalidated device data.  Last Pain:  Vitals:   03/17/21 0643  TempSrc:   PainSc: 0-No pain         Complications: No notable events documented.

## 2021-03-17 NOTE — Anesthesia Preprocedure Evaluation (Signed)
Anesthesia Evaluation  Patient identified by MRN, date of birth, ID band Patient awake    Reviewed: Allergy & Precautions, NPO status , Patient's Chart, lab work & pertinent test results  Airway Mallampati: II  TM Distance: >3 FB Neck ROM: Full    Dental no notable dental hx.    Pulmonary COPD, former smoker, PE   Pulmonary exam normal breath sounds clear to auscultation       Cardiovascular hypertension, Normal cardiovascular exam+ dysrhythmias  Rhythm:Regular Rate:Normal     Neuro/Psych negative neurological ROS  negative psych ROS   GI/Hepatic negative GI ROS, Neg liver ROS,   Endo/Other  negative endocrine ROS  Renal/GU negative Renal ROS  negative genitourinary   Musculoskeletal negative musculoskeletal ROS (+)   Abdominal   Peds negative pediatric ROS (+)  Hematology negative hematology ROS (+)   Anesthesia Other Findings   Reproductive/Obstetrics negative OB ROS                             Anesthesia Physical Anesthesia Plan  ASA: 3  Anesthesia Plan: Spinal   Post-op Pain Management:  Regional for Post-op pain   Induction: Intravenous  PONV Risk Score and Plan: 1 and Treatment may vary due to age or medical condition  Airway Management Planned: Simple Face Mask  Additional Equipment:   Intra-op Plan:   Post-operative Plan:   Informed Consent: I have reviewed the patients History and Physical, chart, labs and discussed the procedure including the risks, benefits and alternatives for the proposed anesthesia with the patient or authorized representative who has indicated his/her understanding and acceptance.     Dental advisory given  Plan Discussed with: CRNA and Surgeon  Anesthesia Plan Comments:         Anesthesia Quick Evaluation

## 2021-03-17 NOTE — Anesthesia Procedure Notes (Signed)
Anesthesia Procedure Image    

## 2021-03-17 NOTE — Interval H&P Note (Signed)
History and Physical Interval Note:  03/17/2021 8:24 AM  Victor Cooper  has presented today for surgery, with the diagnosis of Right knee osteoarthritis.  The various methods of treatment have been discussed with the patient and family. After consideration of risks, benefits and other options for treatment, the patient has consented to  Procedure(s) with comments: TOTAL KNEE ARTHROPLASTY (Right) - adductor canal block as a surgical intervention.  The patient's history has been reviewed, patient examined, no change in status, stable for surgery.  I have reviewed the patient's chart and labs.  Questions were answered to the patient's satisfaction.     Mahari Strahm ANDREW

## 2021-03-17 NOTE — Anesthesia Procedure Notes (Addendum)
Anesthesia Regional Block: Adductor canal block   Pre-Anesthetic Checklist: , timeout performed,  Correct Patient, Correct Site, Correct Laterality,  Correct Procedure, Correct Position, site marked,  Risks and benefits discussed,  Surgical consent,  Pre-op evaluation,  At surgeon's request and post-op pain management  Laterality: Left and Right  Prep: chloraprep       Needles:  Injection technique: Single-shot  Needle Type: Echogenic Needle     Needle Length: 9cm      Additional Needles:   Procedures:,,,, ultrasound used (permanent image in chart),,    Narrative:  Start time: 03/17/2021 8:05 AM End time: 03/17/2021 8:12 AM Injection made incrementally with aspirations every 5 mL.  Performed by: Personally  Anesthesiologist: Myrtie Soman, MD  Additional Notes: Patient tolerated the procedure well without complications

## 2021-03-18 DIAGNOSIS — M1711 Unilateral primary osteoarthritis, right knee: Secondary | ICD-10-CM | POA: Diagnosis not present

## 2021-03-18 DIAGNOSIS — I10 Essential (primary) hypertension: Secondary | ICD-10-CM | POA: Diagnosis not present

## 2021-03-18 DIAGNOSIS — J449 Chronic obstructive pulmonary disease, unspecified: Secondary | ICD-10-CM | POA: Diagnosis not present

## 2021-03-18 DIAGNOSIS — Z8616 Personal history of COVID-19: Secondary | ICD-10-CM | POA: Diagnosis not present

## 2021-03-18 DIAGNOSIS — Z87891 Personal history of nicotine dependence: Secondary | ICD-10-CM | POA: Diagnosis not present

## 2021-03-18 DIAGNOSIS — R7303 Prediabetes: Secondary | ICD-10-CM | POA: Diagnosis not present

## 2021-03-18 DIAGNOSIS — Z8551 Personal history of malignant neoplasm of bladder: Secondary | ICD-10-CM | POA: Diagnosis not present

## 2021-03-18 MED ORDER — METHOCARBAMOL 500 MG PO TABS: 500.0000 mg | ORAL_TABLET | Freq: Four times a day (QID) | ORAL | 0 refills | Status: DC | PRN

## 2021-03-18 MED ORDER — TRAMADOL HCL 50 MG PO TABS: 50.0000 mg | ORAL_TABLET | Freq: Four times a day (QID) | ORAL | 0 refills | Status: AC | PRN

## 2021-03-18 MED ORDER — OXYCODONE HCL 5 MG PO TABS: 5.0000 mg | ORAL_TABLET | ORAL | 0 refills | Status: AC | PRN

## 2021-03-18 MED ORDER — ONDANSETRON HCL 4 MG PO TABS: 4.0000 mg | ORAL_TABLET | Freq: Three times a day (TID) | ORAL | 0 refills | Status: DC | PRN

## 2021-03-18 NOTE — Progress Notes (Signed)
Patient discharged to home w/ son. Given all belongings, instructions. Verbalized understanding of instructions. Escorted to pov via w/c.

## 2021-03-18 NOTE — Plan of Care (Signed)

## 2021-03-18 NOTE — Progress Notes (Signed)
Physical Therapy Treatment Patient Details Name: Victor Cooper MRN: [id number] DOB: April 17, 1941 Today's Date: 03/18/2021   History of Present Illness Patient is 80 y.o. male s/p Rt TKA on 03/17/21 with PMH significant for OA, anemia, bladder cancer, COPD, HTN, PE, hearing loss in Lt ear.    PT Comments    Patient seen for afternoon follow up session with PT. He was able to ambulate ~40' with RW and complete stair mobility. Pt does fatigue quickly an c/o Rt UE fatigue/weakness. No strength differences noted between UE's with MMT. Pt completed single step/curb negotiation with RW and balance improved with reverse step up vs. Forward step up. He verbalized safe guarding position and planning for return home with reverse technique and placing chairs on porch to sit if he is fatigued. Reviewed HEP. Pt is at safe level for discharge home with assist from family.    Recommendations for follow up therapy are one component of a multi-disciplinary discharge planning process, led by the attending physician.  Recommendations may be updated based on patient status, additional functional criteria and insurance authorization.  Follow Up Recommendations  Follow surgeon's recommendation for DC plan and follow-up therapies;Home health PT     Equipment Recommendations  Rolling walker with 5" wheels;3in1 (PT)    Recommendations for Other Services       Precautions / Restrictions Precautions Precautions: Fall Restrictions Weight Bearing Restrictions: No Other Position/Activity Restrictions: WABT     Mobility  Bed Mobility Overal bed mobility: Needs Assistance Bed Mobility: Supine to Sit     Supine to sit: HOB elevated;Min guard     General bed mobility comments: pt using bed rail to raise trunk and able to bring LE's off EOB without assist    Transfers Overall transfer level: Needs assistance Equipment used: Rolling walker (2 wheeled) Transfers: Sit to/from Stand Sit to Stand: From  elevated surface;Min guard         General transfer comment: cues for bil UE use from EOB for power up, close guarding for safety but pt able to rise with extra time and effort. cues for reach back to sit and to extend Rt knee.  Ambulation/Gait Ambulation/Gait assistance: Min assist;Min guard Gait Distance (Feet): 40 Feet Assistive device: Rolling walker (2 wheeled) Gait Pattern/deviations: Step-to pattern;Decreased stride length;Decreased weight shift to right Gait velocity: decr   General Gait Details: pt ambulated greater distance this session with min guard and intermittent assist to maintain safe walker position as he occasionally advances walker too far forward.   Stairs Stairs: Yes   Stair Management: No rails;Step to pattern;Backwards;Forwards;With walker Number of Stairs: 2 (2x1) General stair comments: cues for safe step sequencing with RW to negotiate curb. Attempted step up with forward technique and then reverse. pt with better use of UE's  on reverse step up. verbalized safe guarding position for family to provide.   Wheelchair Mobility    Modified Rankin (Stroke Patients Only)       Balance Overall balance assessment: Needs assistance Sitting-balance support: Feet supported Sitting balance-Leahy Scale: Good     Standing balance support: During functional activity;Bilateral upper extremity supported Standing balance-Leahy Scale: Fair                              Cognition Arousal/Alertness: Awake/alert Behavior During Therapy: WFL for tasks assessed/performed Overall Cognitive Status: Within Functional Limits for tasks assessed  Exercises Total Joint Exercises Ankle Circles/Pumps: AROM;Both;Seated;15 reps Quad Sets: AROM;Right;Seated;5 reps Short Arc Quad: AROM;Right;Seated;5 reps Heel Slides: AROM;Right;Seated;5 reps Hip ABduction/ADduction: AROM;Right;Seated;5 reps Straight Leg  Raises: AROM;Right;Seated;Other reps (comment) (2)    General Comments        Pertinent Vitals/Pain Pain Assessment: 0-10 Pain Score: 8  Pain Location: Rt knee Pain Descriptors / Indicators: Aching;Discomfort Pain Intervention(s): Limited activity within patient's tolerance;Monitored during session;Repositioned;Ice applied    Home Living                      Prior Function            PT Goals (current goals can now be found in the care plan section) Acute Rehab PT Goals Patient Stated Goal: get dancing PT Goal Formulation: With patient Time For Goal Achievement: 03/24/21 Potential to Achieve Goals: Good Progress towards PT goals: Progressing toward goals    Frequency    7X/week      PT Plan Current plan remains appropriate    Co-evaluation              AM-PAC PT "6 Clicks" Mobility   Outcome Measure  Help needed turning from your back to your side while in a flat bed without using bedrails?: A Little Help needed moving from lying on your back to sitting on the side of a flat bed without using bedrails?: A Little Help needed moving to and from a bed to a chair (including a wheelchair)?: A Little Help needed standing up from a chair using your arms (e.g., wheelchair or bedside chair)?: A Little Help needed to walk in hospital room?: A Little Help needed climbing 3-5 steps with a railing? : A Little 6 Click Score: 18    End of Session Equipment Utilized During Treatment: Gait belt Activity Tolerance: Patient tolerated treatment well Patient left: in chair;with call bell/phone within reach;with chair alarm set;with family/visitor present Nurse Communication: Mobility status PT Visit Diagnosis: Muscle weakness (generalized) (M62.81);Difficulty in walking, not elsewhere classified (R26.2)     Time: [id number] PT Time Calculation (min) (ACUTE ONLY): 28 min  Charges:  $Gait Training: 8-22 mins $Therapeutic Exercise: 8-22 mins                      Verner Mould, DPT Acute Rehabilitation Services Office [phone] Pager [phone]    [name] 03/18/2021, 4:32 PM

## 2021-03-18 NOTE — Progress Notes (Signed)
Subjective: 1 Day Post-Op Procedure(s) (LRB): TOTAL KNEE ARTHROPLASTY (Right) Patient reports pain as moderate.   Patient was able to ambulate down the hall yesterday with rolling walker during therapy Positive for voiding flatus Overall doing okay, he is still having some pain in his knee but controlled with p.o. pain medication.  Objective: Vital signs in last 24 hours: Temp:  [97.5 F (36.4 C)-99.4 F (37.4 C)] 99.1 F (37.3 C) (09/15 0416) Pulse Rate:  [72-94] 89 (09/15 0416) Resp:  [10-21] 18 (09/15 0416) BP: (122-165)/(69-94) 142/76 (09/15 0416) SpO2:  [93 %-100 %] 95 % (09/15 0416) Weight:  [101.2 kg] 101.2 kg (09/14 1442)  Intake/Output from previous day: 09/14 0701 - 09/15 0700 In: 2511.3 [P.O.:610; I.V.:1551.3; IV Piggyback:350] Out: 1225 [Urine:1125; Blood:100] Intake/Output this shift: Total I/O In: 661.3 [P.O.:360; I.V.:251.3; IV Piggyback:50] Out: 925 [Urine:925]  Recent Labs    03/17/21 0607 03/17/21 1206  HGB 13.2 12.7*   Recent Labs    03/17/21 0607 03/17/21 1206  WBC 3.7* 11.4*  RBC 4.72 4.55  HCT 40.6 38.8*  PLT 191 218   Recent Labs    03/17/21 1206  CREATININE 1.20   No results for input(s): LABPT, INR in the last 72 hours.  Neurologically intact Neurovascular intact Sensation intact distally Intact pulses distally Dorsiflexion/Plantar flexion intact Incision: dressing C/D/I Compartment soft   Assessment/Plan: 1 Day Post-Op Procedure(s) (LRB): TOTAL KNEE ARTHROPLASTY (Right) Advance diet Up with therapy weightbearing as tolerated Hopefully discharge home today with wife He has physical therapy already set up He is going to continue Lovenox injections for DVT prophylaxis while in the hospital, he already has his DVT prophylaxis at home per his Coumadin clinic instructions Leave Ace bandage on until tomorrow, sent home with extra Ted stocking he will put on tomorrow by himself. I will see him in the office in 2 weeks for  follow-up. All of this was relayed to the patient today     Patient's anticipated LOS is less than 2 midnights, meeting these requirements: - Younger than 64 - Lives within 1 hour of care - Has a competent adult at home to recover with post-op recover - NO history of  - Chronic pain requiring opiods  - Diabetes  - Coronary Artery Disease  - Heart failure  - Heart attack  - Stroke  - DVT/VTE  - Cardiac arrhythmia  - Respiratory Failure/COPD  - Renal failure  - Anemia  - Advanced Liver disease     Derrick Ravel [phone] 03/18/2021, 6:36 AM

## 2021-03-18 NOTE — Progress Notes (Signed)
Physical Therapy Treatment Patient Details Name: Victor Cooper MRN: [id number] DOB: [birth year] Today's Date: 03/18/2021   History of Present Illness Patient is 80 y.o. male s/p Rt TKA on 03/17/21 with PMH significant for OA, anemia, bladder cancer, COPD, HTN, PE, hearing loss in Lt ear.    PT Comments    Patient is progressing well with acute therapy but was limited by pain this AM. He ambulated~25' before requiring seated rest due to pain. Initiated HEP for ROM and circulation this session. Will follow up for stair mobility and additional training to progress mobility as able in preparation for safe discharge home.    Recommendations for follow up therapy are one component of a multi-disciplinary discharge planning process, led by the attending physician.  Recommendations may be updated based on patient status, additional functional criteria and insurance authorization.  Follow Up Recommendations  Follow surgeon's recommendation for DC plan and follow-up therapies;Home health PT     Equipment Recommendations  Rolling walker with 5" wheels;3in1 (PT)    Recommendations for Other Services       Precautions / Restrictions Precautions Precautions: Fall Restrictions Weight Bearing Restrictions: No Other Position/Activity Restrictions: WABT     Mobility  Bed Mobility Overal bed mobility: Needs Assistance Bed Mobility: Supine to Sit     Supine to sit: Min assist;HOB elevated     General bed mobility comments: cues for use of bed rail and assist to bring Rt LE off EOB and raise trunk.    Transfers Overall transfer level: Needs assistance Equipment used: Rolling walker (2 wheeled) Transfers: Sit to/from Stand Sit to Stand: Min assist;From elevated surface         General transfer comment: pt unable to rise from low bed height. cues for hand placement and assist to power up and rise to RW. cues for safe reach back to recliner and to extend Rt knee with sitting to prevent  excessive flexion.  Ambulation/Gait Ambulation/Gait assistance: Min assist Gait Distance (Feet): 25 Feet Assistive device: Rolling walker (2 wheeled) Gait Pattern/deviations: Step-to pattern;Decreased stride length;Decreased weight shift to right Gait velocity: decr   General Gait Details: distance limited by pain, assist required to manage walker proximity safely and cues for step sequencing.   Stairs             Wheelchair Mobility    Modified Rankin (Stroke Patients Only)       Balance Overall balance assessment: Needs assistance Sitting-balance support: Feet supported Sitting balance-Leahy Scale: Good     Standing balance support: During functional activity;Bilateral upper extremity supported Standing balance-Leahy Scale: Fair                              Cognition Arousal/Alertness: Awake/alert Behavior During Therapy: WFL for tasks assessed/performed Overall Cognitive Status: Within Functional Limits for tasks assessed                                        Exercises Total Joint Exercises Ankle Circles/Pumps: AROM;Both;20 reps;Seated Quad Sets: AROM;Right;10 reps;Seated Heel Slides: AROM;Right;10 reps;Seated Hip ABduction/ADduction: AROM;Right;10 reps;Seated    General Comments        Pertinent Vitals/Pain Pain Assessment: 0-10 Pain Score: 8  Pain Location: Rt knee Pain Descriptors / Indicators: Aching;Discomfort Pain Intervention(s): Limited activity within patient's tolerance;Monitored during session;Repositioned;RN gave pain meds during session    Home Living  Prior Function            PT Goals (current goals can now be found in the care plan section) Acute Rehab PT Goals Patient Stated Goal: get dancing PT Goal Formulation: With patient Time For Goal Achievement: 03/24/21 Potential to Achieve Goals: Good Progress towards PT goals: Progressing toward goals    Frequency     7X/week      PT Plan Current plan remains appropriate    Co-evaluation              AM-PAC PT "6 Clicks" Mobility   Outcome Measure  Help needed turning from your back to your side while in a flat bed without using bedrails?: A Little Help needed moving from lying on your back to sitting on the side of a flat bed without using bedrails?: A Little Help needed moving to and from a bed to a chair (including a wheelchair)?: A Little Help needed standing up from a chair using your arms (e.g., wheelchair or bedside chair)?: A Little Help needed to walk in hospital room?: A Little Help needed climbing 3-5 steps with a railing? : A Little 6 Click Score: 18    End of Session Equipment Utilized During Treatment: Gait belt Activity Tolerance: Patient tolerated treatment well Patient left: in chair;with call bell/phone within reach;with chair alarm set;with family/visitor present Nurse Communication: Mobility status PT Visit Diagnosis: Muscle weakness (generalized) (M62.81);Difficulty in walking, not elsewhere classified (R26.2)     Time: [id number] PT Time Calculation (min) (ACUTE ONLY): 18 min  Charges:  $Gait Training: 8-22 mins                     Verner Mould, DPT Acute Rehabilitation Services Office [phone] Pager [phone]    [name] 03/18/2021, 11:36 AM

## 2021-03-18 NOTE — TOC Transition Note (Signed)
Transition of Care [facility]) - CM/SW Discharge Note  Patient Details  Name: Victor Cooper MRN: [id number] Date of Birth: Feb 14, 1941  [facility] CM/SW Contact:  Sherie Don, LCSW Phone Number: 03/18/2021, 9:33 AM  Clinical Narrative: Patient is expected to discharge after working with PT. CSW spoke with patient to confirm discharge plan. Patient will discharge home with OPPT at [facility]. Patient has a rolling walker and 3N1 at home, so there are no DME needs at this time. TOC signing off.  Final next level of care: OP Rehab Barriers to Discharge: No Barriers Identified  Patient Goals and CMS Choice Patient states their goals for this hospitalization and ongoing recovery are:: Discharge home with OPPT Choice offered to / list presented to : NA  Discharge Plan and Services         DME Arranged: N/A DME Agency: NA  Readmission Risk Interventions No flowsheet data found.

## 2021-03-18 NOTE — Discharge Summary (Signed)
Physician Discharge Summary  Patient ID: Victor Cooper MRN: [id number] DOB/AGE: December 30, 1940 80 y.o.  Admit date: 03/17/2021 Discharge date: 03/18/2021  Admission Diagnoses: Right knee osteoarthritis  Discharge Diagnoses:  Active Problems:   Osteoarthritis of right knee   Discharged Condition: good  Hospital Course: Patient was admitted on March 17, 2021 for end-stage right knee osteoarthritis.  He was admitted for a right total knee arthroplasty.  Patient did well during surgery.  He was sent to PACU in stable condition, sent up to the postop floor in stable condition.  Patient worked with physical therapy same-day surgery.  He is able to get up and ambulate with rolling walker.  No events occurred overnight.  Postop day 1 patient doing well, pain well controlled with p.o. pain medication.  Receiving Ancef for postoperative antibiotics.  He did well physical therapy day 1 and was able to be discharged home.  Patient is going to be doing Lovenox injections per Coumadin clinic for postop DVT prophylaxis and will eventually transition back onto his Coumadin.  He also got sent home with oxycodone, tramadol, Zofran and methocarbamol.  Consults: None  Significant Diagnostic Studies: None  Treatments: IV hydration, antibiotics: Ancef, analgesia: acetaminophen and Hydrocodone, tramadol, Dilaudid, Methocarbamol, anticoagulation: Lovenox, therapies: PT, and surgery: right TKA  Discharge Exam: Blood pressure (!) 142/76, pulse 89, temperature 99.1 F (37.3 C), temperature source Oral, resp. rate 18, height 5' 10.5" (1.791 m), weight 101.2 kg, SpO2 95 %. General appearance: alert, cooperative, appears stated age, and no distress Extremities: extremities normal, atraumatic, no cyanosis or edema and Homans sign is negative, no sign of DVT Pulses:  L brachial 2+ R brachial 2+  L radial 2+ R radial 2+  L inguinal 2+ R inguinal 2+  L popliteal 2+ R popliteal 2+  L posterior tibial 2+ R posterior  tibial 2+  L dorsalis pedis 2+ R dorsalis pedis 2+   Skin: Skin color, texture, turgor normal. No rashes or lesions Neurologic: Grossly normal, decreased strength in right leg due to Right TKA Incision/Wound: Dressings clean dry and intact  Disposition: Discharge disposition: 01-Home or Self Care       Discharge Instructions     Call MD / Call 911   Complete by: As directed    If you experience chest pain or shortness of breath, CALL 911 and be transported to the hospital emergency room.  If you develope a fever above 101 F, pus (white drainage) or increased drainage or redness at the wound, or calf pain, call your surgeon's office.   Constipation Prevention   Complete by: As directed    Drink plenty of fluids.  Prune juice may be helpful.  You may use a stool softener, such as Colace (over the counter) 100 mg twice a day.  Use MiraLax (over the counter) for constipation as needed.   Diet - low sodium heart healthy   Complete by: As directed    Discharge instructions   Complete by: As directed    Dr. Sydnee Cabal [facility] [address] [place], [place] [address] [phone]  TOTAL KNEE REPLACEMENT POSTOPERATIVE DIRECTIONS  Knee Rehabilitation, Guidelines Following Surgery  Results after knee surgery are often greatly improved when you follow the exercise, range of motion and muscle strengthening exercises prescribed by your doctor. Safety measures are also important to protect the knee from further injury. Any time any of these exercises cause you to have increased pain or swelling in your knee joint, decrease the amount until  you are comfortable again and slowly increase them. If you have problems or questions, call your caregiver or physical therapist for advice.   HOME CARE INSTRUCTIONS  Remove items at home which could result in a fall. This includes throw rugs or furniture in walking pathways.  ICE to the affected knee every three hours for 30 minutes  at a time and then as needed for pain and swelling.  Continue to use ice on the knee for pain and swelling from surgery. You may notice swelling that will progress down to the foot and ankle.  This is normal after surgery.  Elevate the leg when you are not up walking on it.   Continue to use the breathing machine which will help keep your temperature down.  It is common for your temperature to cycle up and down following surgery, especially at night when you are not up moving around and exerting yourself.  The breathing machine keeps your lungs expanded and your temperature down. Do not place pillow under knee, focus on keeping the knee straight while resting   DIET You may resume your previous home diet once your are discharged from the hospital.  DRESSING / WOUND CARE / SHOWERING Keep the surgical dressing until follow up.  The dressing is water proof, but you need to put extra covering over it like plastic wrap.  IF THE DRESSING FALLS OFF or the wound gets wet inside, change the dressing with sterile gauze.  Please use good hand washing techniques before changing the dressing.  Do not use any lotions or creams on the incision until instructed by your surgeon.   You may start showering once you are discharged home but do not submerge the incision under water.  You are sent home with an ACE bandage on over the leg, this can be removed at 3 days from surgery. At this time you can start showering. Please place the white TED stocking on the surgical leg after. This needs to be worn on the surgical leg for 2 weeks after surgery.   ACTIVITY Walk with your walker as instructed. Use walker as long as suggested by your caregivers. Avoid periods of inactivity such as sitting longer than an hour when not asleep. This helps prevent blood clots.  You may resume a sexual relationship in one month or when given the OK by your doctor.  You may return to work once you are cleared by your doctor.  Do not drive a  car for 6 weeks or until released by you surgeon.  Do not drive while taking narcotics.  WEIGHT BEARING Weight bearing as tolerated with assist device (walker, cane, etc) as directed, use it as long as suggested by your surgeon or therapist, typically at least 4-6 weeks.  POSTOPERATIVE CONSTIPATION PROTOCOL Constipation - defined medically as fewer than three stools per week and severe constipation as less than one stool per week.  One of the most common issues patients have following surgery is constipation.  Even if you have a regular bowel pattern at home, your normal regimen is likely to be disrupted due to multiple reasons following surgery.  Combination of anesthesia, postoperative narcotics, change in appetite and fluid intake all can affect your bowels.  In order to avoid complications following surgery, here are some recommendations in order to help you during your recovery period.  Colace (docusate) - Pick up an over-the-counter form of Colace or another stool softener and take twice a day as long as you are requiring  postoperative pain medications.  Take with a full glass of water daily.  If you experience loose stools or diarrhea, hold the colace until you stool forms back up.  If your symptoms do not get better within 1 week or if they get worse, check with your doctor.  Dulcolax (bisacodyl) - Pick up over-the-counter and take as directed by the product packaging as needed to assist with the movement of your bowels.  Take with a full glass of water.  Use this product as needed if not relieved by Colace only.   MiraLax (polyethylene glycol) - Pick up over-the-counter to have on hand.  MiraLax is a solution that will increase the amount of water in your bowels to assist with bowel movements.  Take as directed and can mix with a glass of water, juice, soda, coffee, or tea.  Take if you go more than two days without a movement. Do not use MiraLax more than once per day. Call your doctor if  you are still constipated or irregular after using this medication for 7 days in a row.  If you continue to have problems with postoperative constipation, please contact the office for further assistance and recommendations.  If you experience "the worst abdominal pain ever" or develop nausea or vomiting, please contact the office immediatly for further recommendations for treatment.  ITCHING  If you experience itching with your medications, try taking only a single pain pill, or even half a pain pill at a time.  You can also use Benadryl over the counter for itching or also to help with sleep.   TED HOSE STOCKINGS Wear the elastic stockings on both legs for two weeks following surgery during the day but you may remove then at night for sleeping.  Okay to remove ACE in 3 days, put TED on after  MEDICATIONS See your medication summary on the "After Visit Summary" that the nursing staff will review with you prior to discharge.  You may have some home medications which will be placed on hold until you complete the course of blood thinner medication.  It is important for you to complete the blood thinner medication as prescribed by your surgeon.  Continue your approved medications as instructed at time of discharge. If you were not previously taking any blood thinners prior to surgery please start taking Aspirin 325 mg tabs twice daily for 6 weeks. If you are unable to take Aspirin please let your doctor know.   PRECAUTIONS If you experience chest pain or shortness of breath - call 911 immediately for transfer to the hospital emergency department.  If you develop a fever greater that 101 F, purulent drainage from wound, increased redness or drainage from wound, foul odor from the wound/dressing, or calf pain - CONTACT YOUR SURGEON.                                                   FOLLOW-UP APPOINTMENTS Make sure you keep all of your appointments after your operation with your surgeon and  caregivers. You should call the office at the above phone number and make an appointment for approximately two weeks after the date of your surgery or on the date instructed by your surgeon outlined in the "After Visit Summary".   RANGE OF MOTION AND STRENGTHENING EXERCISES  Rehabilitation of the knee is important following a  knee injury or an operation. After just a few days of immobilization, the muscles of the thigh which control the knee become weakened and shrink (atrophy). Knee exercises are designed to build up the tone and strength of the thigh muscles and to improve knee motion. Often times heat used for twenty to thirty minutes before working out will loosen up your tissues and help with improving the range of motion but do not use heat for the first two weeks following surgery. These exercises can be done on a training (exercise) mat, on the floor, on a table or on a bed. Use what ever works the best and is most comfortable for you Knee exercises include:  Leg Lifts - While your knee is still immobilized in a splint or cast, you can do straight leg raises. Lift the leg to 60 degrees, hold for 3 sec, and slowly lower the leg. Repeat 10-20 times 2-3 times daily. Perform this exercise against resistance later as your knee gets better.  Quad and Hamstring Sets - Tighten up the muscle on the front of the thigh (Quad) and hold for 5-10 sec. Repeat this 10-20 times hourly. Hamstring sets are done by pushing the foot backward against an object and holding for 5-10 sec. Repeat as with quad sets.  Leg Slides: Lying on your back, slowly slide your foot toward your buttocks, bending your knee up off the floor (only go as far as is comfortable). Then slowly slide your foot back down until your leg is flat on the floor again. Angel Wings: Lying on your back spread your legs to the side as far apart as you can without causing discomfort.  A rehabilitation program following serious knee injuries can speed  recovery and prevent re-injury in the future due to weakened muscles. Contact your doctor or a physical therapist for more information on knee rehabilitation.   IF YOU ARE TRANSFERRED TO A SKILLED REHAB FACILITY If the patient is transferred to a skilled rehab facility following release from the hospital, a list of the current medications will be sent to the facility for the patient to continue.  When discharged from the skilled rehab facility, please have the facility set up the patient's [place] prior to being released. Also, the skilled facility will be responsible for providing the patient with their medications at time of release from the facility to include their pain medication, the muscle relaxants, and their blood thinner medication. If the patient is still at the rehab facility at time of the two week follow up appointment, the skilled rehab facility will also need to assist the patient in arranging follow up appointment in our office and any transportation needs.  MAKE SURE YOU:  Understand these instructions.  Get help right away if you are not doing well or get worse.    Pick up stool softner and laxative for home use following surgery while on pain medications. May shower starting three days after surgery. Please use a clean towel to pat the leg dry following showers. Continue to use ice for pain and swelling after surgery. Do not use any lotions or creams on the incision until instructed by you Start Aspirin immediately following surgery.   Do not put a pillow under the knee. Place it under the heel.   Complete by: As directed    Driving restrictions   Complete by: As directed    No driving for two weeks   Post-operative opioid taper instructions:   Complete by:  As directed    POST-OPERATIVE OPIOID TAPER INSTRUCTIONS: It is important to wean off of your opioid medication as soon as possible. If you do not need pain medication after your surgery it is ok to  stop day one. Opioids include: Codeine, Hydrocodone(Norco, Vicodin), Oxycodone(Percocet, oxycontin) and hydromorphone amongst others.  Long term and even short term use of opiods can cause: Increased pain response Dependence Constipation Depression Respiratory depression And more.  Withdrawal symptoms can include Flu like symptoms Nausea, vomiting And more Techniques to manage these symptoms Hydrate well Eat regular healthy meals Stay active Use relaxation techniques(deep breathing, meditating, yoga) Do Not substitute Alcohol to help with tapering If you have been on opioids for less than two weeks and do not have pain than it is ok to stop all together.  Plan to wean off of opioids This plan should start within one week post op of your joint replacement. Maintain the same interval or time between taking each dose and first decrease the dose.  Cut the total daily intake of opioids by one tablet each day Next start to increase the time between doses. The last dose that should be eliminated is the evening dose.      TED hose   Complete by: As directed    Use stockings (TED hose) for three weeks on both leg(s).  You may remove them at night for sleeping.   Weight bearing as tolerated   Complete by: As directed       Allergies as of 03/18/2021   No Known Allergies      Medication List     TAKE these medications    acetaminophen 650 MG CR tablet Commonly known as: TYLENOL Take 1,300 mg by mouth every 8 (eight) hours as needed for pain (arthritis).   albuterol 108 (90 Base) MCG/ACT inhaler Commonly known as: VENTOLIN HFA Inhale 2 puffs into the lungs every 6 (six) hours. What changed:  when to take this reasons to take this   amLODipine 10 MG tablet Commonly known as: NORVASC Take 10 mg by mouth daily.   ascorbic acid 500 MG tablet Commonly known as: VITAMIN C Take 1 tablet (500 mg total) by mouth daily.   aspirin EC 81 MG tablet Take 81 mg by mouth  daily.   colchicine 0.6 MG tablet Take 1 tablet (0.6 mg total) by mouth daily.   diclofenac Sodium 1 % Gel Commonly known as: VOLTAREN Apply 2 g topically daily as needed (pain).   isosorbide mononitrate 30 MG 24 hr tablet Commonly known as: IMDUR Take 1 tablet (30 mg total) by mouth daily.   methocarbamol 500 MG tablet Commonly known as: ROBAXIN Take 1 tablet (500 mg total) by mouth every 6 (six) hours as needed for muscle spasms.   olmesartan-hydrochlorothiazide 40-25 MG tablet Commonly known as: BENICAR HCT Take 1 tablet by mouth once daily   ondansetron 4 MG tablet Commonly known as: ZOFRAN Take 1 tablet (4 mg total) by mouth every 8 (eight) hours as needed for nausea or vomiting.   oxyCODONE 5 MG immediate release tablet Commonly known as: Oxy IR/ROXICODONE Take 1-2 tablets (5-10 mg total) by mouth every 4 (four) hours as needed for up to 7 days for severe pain.   pantoprazole 40 MG tablet Commonly known as: PROTONIX Take 1 tablet (40 mg total) by mouth daily.   rosuvastatin 20 MG tablet Commonly known as: CRESTOR Take 1 tablet by mouth once daily   Stiolto Respimat 2.5-2.5 MCG/ACT Aers Generic drug:  Tiotropium Bromide-Olodaterol Inhale 1 puff into the lungs daily.   tamsulosin 0.4 MG Caps capsule Commonly known as: FLOMAX Take 1 capsule (0.4 mg total) by mouth at bedtime.   traMADol 50 MG tablet Commonly known as: Ultram Take 1 tablet (50 mg total) by mouth every 6 (six) hours as needed for up to 7 days for moderate pain.   warfarin 5 MG tablet Commonly known as: COUMADIN Take 5-7.5 mg by mouth See admin instructions. Take 5 mg by mouth daily except on Thursday take 7.5 mg               Discharge Care Instructions  (From admission, onward)           Start     Ordered   03/18/21 0000  Weight bearing as tolerated        03/18/21 0645            Follow-up Information     Sydnee Cabal, MD. Schedule an appointment as soon as possible  for a visit in 2 week(s).   Specialty: Orthopedic Surgery Contact information: [address] [place] [address] [id number]                 Signed: Drue Novel 03/18/2021, 6:45 AM

## 2021-03-20 ENCOUNTER — Encounter (HOSPITAL_COMMUNITY): Payer: Self-pay | Admitting: Specialist

## 2021-03-22 ENCOUNTER — Ambulatory Visit: Payer: Medicare HMO | Attending: Specialist | Admitting: Physical Therapy

## 2021-03-22 ENCOUNTER — Encounter: Payer: Self-pay | Admitting: Physical Therapy

## 2021-03-22 ENCOUNTER — Other Ambulatory Visit: Payer: Self-pay

## 2021-03-22 DIAGNOSIS — M6281 Muscle weakness (generalized): Secondary | ICD-10-CM

## 2021-03-22 DIAGNOSIS — M25561 Pain in right knee: Secondary | ICD-10-CM | POA: Diagnosis not present

## 2021-03-22 DIAGNOSIS — M25661 Stiffness of right knee, not elsewhere classified: Secondary | ICD-10-CM

## 2021-03-22 DIAGNOSIS — R2689 Other abnormalities of gait and mobility: Secondary | ICD-10-CM | POA: Diagnosis not present

## 2021-03-22 DIAGNOSIS — R262 Difficulty in walking, not elsewhere classified: Secondary | ICD-10-CM

## 2021-03-22 DIAGNOSIS — R600 Localized edema: Secondary | ICD-10-CM | POA: Diagnosis not present
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
## 2021-03-22 NOTE — Patient Instructions (Signed)
    Access Code: [email or web address] URL: [email or web address] Date: 03/22/2021 Prepared by: Annie Paras  Exercises Supine Single Leg Ankle Pumps - 3 x daily - 7 x weekly - 2 sets - 10 reps - 3 sec hold Supine Quadricep Sets - 3 x daily - 7 x weekly - 2 sets - 10 reps - 5 sec hold Active Straight Leg Raise with Quad Set - 3 x daily - 7 x weekly - 2 sets - 10 reps - 3-5 sec hold Supine Heel Slide with Strap - 3 x daily - 7 x weekly - 2 sets - 10 reps - 3 sec hold Seated Heel Slide - 3 x daily - 7 x weekly - 2 sets - 10 reps - 3 sec hold Seated Long Arc Quad - 3 x daily - 7 x weekly - 2 sets - 10 reps - 3 sec hold

## 2021-03-23 ENCOUNTER — Telehealth: Payer: Self-pay

## 2021-03-23 DIAGNOSIS — I82502 Chronic embolism and thrombosis of unspecified deep veins of left lower extremity: Secondary | ICD-10-CM | POA: Diagnosis not present

## 2021-03-23 DIAGNOSIS — Z86711 Personal history of pulmonary embolism: Secondary | ICD-10-CM | POA: Diagnosis not present

## 2021-03-23 DIAGNOSIS — J449 Chronic obstructive pulmonary disease, unspecified: Secondary | ICD-10-CM | POA: Diagnosis not present

## 2021-03-23 NOTE — Telephone Encounter (Signed)
Transition Care Management Follow-up Telephone Call Date of discharge and from where: 03/18/2021  [facility] How have you been since you were released from the hospital? Doing well Any questions or concerns? No  Items Reviewed: Did the pt receive and understand the discharge instructions provided? Yes  Medications obtained and verified? Yes  Other? Yes   Daughter reports patient had blisters around dressing and went to Ortho on 03/22/2021 for dressing change.   Hard of hearing so phone call with daughter Any new allergies since your discharge? No  Dietary orders reviewed? Yes Do you have support at home? Yes   Home Care and Equipment/Supplies: Were home health services ordered? no If so, what is the name of the agency?  Has the agency set up a time to come to the patient's home? not applicable Were any new equipment or medical supplies ordered?  No What is the name of the medical supply agency?  Were you able to get the supplies/equipment? not applicable Do you have any questions related to the use of the equipment or supplies? No  Patient is going to outpatient PT. Reviewed schedule with daughter.  Functional Questionnaire: (I = Independent and D = Dependent) ADLs: D  needs assistance  Bathing/Dressing- D  needs assistance  Meal Prep- D  needs assistance  Eating- I  Maintaining continence- Using elevated toilet seat  Transferring/Ambulation- using walker  Managing Meds- I  Follow up appointments reviewed:  PCP Hospital f/u appt confirmed? No   Specialist Hospital f/u appt confirmed?  Yes  daughter states she has it written down Are transportation arrangements needed? No  If their condition worsens, is the pt aware to call PCP or go to the Emergency Dept.? Yes Was the patient provided with contact information for the PCP's office or ED? Yes Was to pt encouraged to call back with questions or concerns? Yes  Tomasa Rand, RN, BSN, CEN [facility] [employer] [phone]

## 2021-03-24 ENCOUNTER — Ambulatory Visit: Payer: Medicare HMO

## 2021-03-25 ENCOUNTER — Encounter: Payer: Self-pay | Admitting: Family Medicine

## 2021-03-31 ENCOUNTER — Telehealth: Payer: Self-pay

## 2021-03-31 NOTE — Telephone Encounter (Signed)
   Victor Cooper DOB: [birth year] MRN: [id number]   RIDER WAIVER AND RELEASE OF LIABILITY  For purposes of improving physical access to our facilities, [facility] is pleased to partner with third parties to provide [facility] patients or other authorized individuals the option of convenient, on-demand ground transportation services (the [employer] bre[employer]") through use of the technology service that enables users to request on-demand ground transportation from independent third-party providers.  By opting to use and accept these [employer], I, the undersigned, hereby agree on behalf of myself, and on behalf of any minor child using the Government social research officer for whom I am the parent or legal guardian, as follows:  Government social research officer provided to me are provided by independent third-party transportation providers who are not [employer] or employees and who are unaffiliated with [employer]. [place] is neither a transportation carrier nor a common or public carrier. [facility] has no control over the quality or safety of the transportation that occurs as a result of the [employer]. [facility] cannot guarantee that any third-party transportation provider will complete any arranged transportation service. [facility] makes no representation, warranty, or guarantee regarding the reliability, timeliness, quality, safety, suitability, or availability of any of the Transport Services or that they will be error free. I fully understand that traveling by vehicle involves risks and dangers of serious bodily injury, including permanent disability, paralysis, and death. I agree, on behalf of myself and on behalf of any minor child using the Transport Services for whom I am the parent or legal guardian, that the entire risk arising out of my use of the [employer] remains solely with me, to the maximum extent permitted under applicable law. The [employer] are provided "as  is" and "as available." [place] disclaims all representations and warranties, express, implied or statutory, not expressly set out in these terms, including the implied warranties of merchantability and fitness for a particular purpose. I hereby waive and release [place], its agents, employees, officers, directors, representatives, insurers, attorneys, assigns, successors, subsidiaries, and affiliates from any and all past, present, or future claims, demands, liabilities, actions, causes of action, or suits of any kind directly or indirectly arising from acceptance and use of the [employer]. I further waive and release [place] and its affiliates from all present and future liability and responsibility for any injury or death to persons or damages to property caused by or related to the use of the [employer]. I have read this Waiver and Release of Liability, and I understand the terms used in it and their legal significance. This Waiver is freely and voluntarily given with the understanding that my right (as well as the right of any minor child for whom I am the parent or legal guardian using the [employer]) to legal recourse against [place] in connection with the [employer] is knowingly surrendered in return for use of these services.   I attest that I read the consent document to Anselm Pancoast, gave Mr. Train the opportunity to ask questions and answered the questions asked (if any). I affirm that Anselm Pancoast then provided consent for he's participation in this program.     Drucie Ip

## 2021-04-01 ENCOUNTER — Ambulatory Visit: Payer: Medicare HMO

## 2021-04-01 ENCOUNTER — Other Ambulatory Visit: Payer: Self-pay

## 2021-04-01 DIAGNOSIS — R262 Difficulty in walking, not elsewhere classified: Secondary | ICD-10-CM | POA: Diagnosis not present

## 2021-04-01 DIAGNOSIS — M25661 Stiffness of right knee, not elsewhere classified: Secondary | ICD-10-CM | POA: Diagnosis not present

## 2021-04-01 DIAGNOSIS — M25561 Pain in right knee: Secondary | ICD-10-CM | POA: Diagnosis not present

## 2021-04-01 DIAGNOSIS — R2689 Other abnormalities of gait and mobility: Secondary | ICD-10-CM

## 2021-04-01 DIAGNOSIS — R600 Localized edema: Secondary | ICD-10-CM | POA: Diagnosis not present

## 2021-04-01 DIAGNOSIS — M6281 Muscle weakness (generalized): Secondary | ICD-10-CM | POA: Diagnosis not present
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
## 2021-04-02 DIAGNOSIS — Z4889 Encounter for other specified surgical aftercare: Secondary | ICD-10-CM | POA: Diagnosis not present

## 2021-04-06 ENCOUNTER — Ambulatory Visit: Payer: Medicare HMO | Attending: Specialist

## 2021-04-06 ENCOUNTER — Other Ambulatory Visit: Payer: Self-pay

## 2021-04-06 DIAGNOSIS — M25561 Pain in right knee: Secondary | ICD-10-CM | POA: Diagnosis not present

## 2021-04-06 DIAGNOSIS — M6281 Muscle weakness (generalized): Secondary | ICD-10-CM | POA: Insufficient documentation

## 2021-04-06 DIAGNOSIS — R262 Difficulty in walking, not elsewhere classified: Secondary | ICD-10-CM | POA: Insufficient documentation

## 2021-04-06 DIAGNOSIS — R2689 Other abnormalities of gait and mobility: Secondary | ICD-10-CM | POA: Diagnosis not present

## 2021-04-06 DIAGNOSIS — R600 Localized edema: Secondary | ICD-10-CM | POA: Insufficient documentation

## 2021-04-06 DIAGNOSIS — M25661 Stiffness of right knee, not elsewhere classified: Secondary | ICD-10-CM | POA: Insufficient documentation
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
## 2021-04-07 ENCOUNTER — Ambulatory Visit: Payer: Medicare HMO

## 2021-04-07 DIAGNOSIS — R600 Localized edema: Secondary | ICD-10-CM

## 2021-04-07 DIAGNOSIS — M25561 Pain in right knee: Secondary | ICD-10-CM | POA: Diagnosis not present

## 2021-04-07 DIAGNOSIS — R262 Difficulty in walking, not elsewhere classified: Secondary | ICD-10-CM | POA: Diagnosis not present

## 2021-04-07 DIAGNOSIS — R2689 Other abnormalities of gait and mobility: Secondary | ICD-10-CM | POA: Diagnosis not present

## 2021-04-07 DIAGNOSIS — Z86711 Personal history of pulmonary embolism: Secondary | ICD-10-CM | POA: Diagnosis not present

## 2021-04-07 DIAGNOSIS — M6281 Muscle weakness (generalized): Secondary | ICD-10-CM | POA: Diagnosis not present

## 2021-04-07 DIAGNOSIS — M25661 Stiffness of right knee, not elsewhere classified: Secondary | ICD-10-CM

## 2021-04-07 DIAGNOSIS — I82502 Chronic embolism and thrombosis of unspecified deep veins of left lower extremity: Secondary | ICD-10-CM | POA: Diagnosis not present

## 2021-04-07 DIAGNOSIS — R791 Abnormal coagulation profile: Secondary | ICD-10-CM | POA: Diagnosis not present
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
## 2021-04-08 NOTE — Telephone Encounter (Signed)
Will get scheduled 04/15/21 at 1pm

## 2021-04-14 ENCOUNTER — Other Ambulatory Visit: Payer: Self-pay

## 2021-04-14 ENCOUNTER — Ambulatory Visit: Payer: Medicare HMO

## 2021-04-14 DIAGNOSIS — R2689 Other abnormalities of gait and mobility: Secondary | ICD-10-CM

## 2021-04-14 DIAGNOSIS — M25661 Stiffness of right knee, not elsewhere classified: Secondary | ICD-10-CM | POA: Diagnosis not present

## 2021-04-14 DIAGNOSIS — R600 Localized edema: Secondary | ICD-10-CM

## 2021-04-14 DIAGNOSIS — M6281 Muscle weakness (generalized): Secondary | ICD-10-CM

## 2021-04-14 DIAGNOSIS — M25561 Pain in right knee: Secondary | ICD-10-CM | POA: Diagnosis not present

## 2021-04-14 DIAGNOSIS — R262 Difficulty in walking, not elsewhere classified: Secondary | ICD-10-CM | POA: Diagnosis not present
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
## 2021-04-15 ENCOUNTER — Ambulatory Visit: Payer: Medicare HMO

## 2021-04-15 ENCOUNTER — Telehealth: Payer: Medicare HMO | Admitting: Family Medicine

## 2021-04-15 DIAGNOSIS — M25561 Pain in right knee: Secondary | ICD-10-CM

## 2021-04-15 DIAGNOSIS — R262 Difficulty in walking, not elsewhere classified: Secondary | ICD-10-CM

## 2021-04-15 DIAGNOSIS — R2689 Other abnormalities of gait and mobility: Secondary | ICD-10-CM | POA: Diagnosis not present

## 2021-04-15 DIAGNOSIS — M25661 Stiffness of right knee, not elsewhere classified: Secondary | ICD-10-CM

## 2021-04-15 DIAGNOSIS — M6281 Muscle weakness (generalized): Secondary | ICD-10-CM

## 2021-04-15 DIAGNOSIS — R600 Localized edema: Secondary | ICD-10-CM
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
## 2021-04-19 ENCOUNTER — Encounter: Payer: Self-pay | Admitting: Physical Therapy

## 2021-04-19 ENCOUNTER — Ambulatory Visit: Payer: Medicare HMO | Admitting: Physical Therapy

## 2021-04-19 ENCOUNTER — Other Ambulatory Visit: Payer: Self-pay

## 2021-04-19 DIAGNOSIS — M25561 Pain in right knee: Secondary | ICD-10-CM | POA: Diagnosis not present

## 2021-04-19 DIAGNOSIS — M25661 Stiffness of right knee, not elsewhere classified: Secondary | ICD-10-CM | POA: Diagnosis not present

## 2021-04-19 DIAGNOSIS — M6281 Muscle weakness (generalized): Secondary | ICD-10-CM

## 2021-04-19 DIAGNOSIS — R2689 Other abnormalities of gait and mobility: Secondary | ICD-10-CM

## 2021-04-19 DIAGNOSIS — R600 Localized edema: Secondary | ICD-10-CM

## 2021-04-19 DIAGNOSIS — R262 Difficulty in walking, not elsewhere classified: Secondary | ICD-10-CM | POA: Diagnosis not present
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
## 2021-04-20 ENCOUNTER — Ambulatory Visit: Payer: Medicare HMO | Admitting: Physical Therapy

## 2021-04-20 ENCOUNTER — Encounter: Payer: Self-pay | Admitting: Physical Therapy

## 2021-04-20 DIAGNOSIS — R2689 Other abnormalities of gait and mobility: Secondary | ICD-10-CM | POA: Diagnosis not present

## 2021-04-20 DIAGNOSIS — R600 Localized edema: Secondary | ICD-10-CM

## 2021-04-20 DIAGNOSIS — M25661 Stiffness of right knee, not elsewhere classified: Secondary | ICD-10-CM | POA: Diagnosis not present

## 2021-04-20 DIAGNOSIS — M6281 Muscle weakness (generalized): Secondary | ICD-10-CM

## 2021-04-20 DIAGNOSIS — M25561 Pain in right knee: Secondary | ICD-10-CM | POA: Diagnosis not present

## 2021-04-20 DIAGNOSIS — R262 Difficulty in walking, not elsewhere classified: Secondary | ICD-10-CM | POA: Diagnosis not present

## 2021-04-20 NOTE — Patient Instructions (Addendum)
    AAccess Code: [email or web address] URL: [email or web address] Date: 04/20/2021 Prepared by: Annie Paras  Exercises Supine Single Leg Ankle Pumps - 3 x daily - 7 x weekly - 2 sets - 10 reps - 3 sec hold Supine Quadricep Sets - 3 x daily - 7 x weekly - 2 sets - 10 reps - 5 sec hold Active Straight Leg Raise with Quad Set - 3 x daily - 7 x weekly - 2 sets - 10 reps - 3-5 sec hold Supine Heel Slide with Strap - 3 x daily - 7 x weekly - 2 sets - 10 reps - 3 sec hold Seated Heel Slide - 3 x daily - 7 x weekly - 2 sets - 10 reps - 3 sec hold Seated Long Arc Quad - 3 x daily - 7 x weekly - 2 sets - 10 reps - 3 sec hold Modified Thomas Stretch - 2-3 x daily - 7 x weekly - 3 reps - 30 sec hold Retro Step - 1 x daily - 7 x weekly - 2 sets - 10 reps - 3 sec hold Standing Terminal Knee Extension with Resistance - 1 x daily - 7 x weekly - 2 sets - 10 reps - 5 sec hold Hip Abduction with Resistance Loop - 1 x daily - 7 x weekly - 2 sets - 10 reps - 3 sec hold Hip Extension with Resistance Loop - 1 x daily - 7 x weekly - 2 sets - 10 reps - 3 sec hold Standing Hip Flexion with Resistance Loop - 1 x daily - 7 x weekly - 2 sets - 10 reps - 3 sec hold
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
## 2021-04-22 ENCOUNTER — Ambulatory Visit: Payer: Medicare HMO

## 2021-04-22 ENCOUNTER — Other Ambulatory Visit: Payer: Self-pay

## 2021-04-22 DIAGNOSIS — R262 Difficulty in walking, not elsewhere classified: Secondary | ICD-10-CM

## 2021-04-22 DIAGNOSIS — M6281 Muscle weakness (generalized): Secondary | ICD-10-CM

## 2021-04-22 DIAGNOSIS — M25661 Stiffness of right knee, not elsewhere classified: Secondary | ICD-10-CM

## 2021-04-22 DIAGNOSIS — R2689 Other abnormalities of gait and mobility: Secondary | ICD-10-CM

## 2021-04-22 DIAGNOSIS — R600 Localized edema: Secondary | ICD-10-CM | POA: Diagnosis not present

## 2021-04-22 DIAGNOSIS — M25561 Pain in right knee: Secondary | ICD-10-CM

## 2021-04-22 DIAGNOSIS — J449 Chronic obstructive pulmonary disease, unspecified: Secondary | ICD-10-CM | POA: Diagnosis not present
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
## 2021-04-26 ENCOUNTER — Encounter: Payer: Medicare HMO | Admitting: Physical Therapy

## 2021-04-26 ENCOUNTER — Ambulatory Visit: Payer: Medicare HMO

## 2021-04-26 ENCOUNTER — Other Ambulatory Visit: Payer: Self-pay

## 2021-04-26 DIAGNOSIS — R600 Localized edema: Secondary | ICD-10-CM | POA: Diagnosis not present

## 2021-04-26 DIAGNOSIS — M25561 Pain in right knee: Secondary | ICD-10-CM

## 2021-04-26 DIAGNOSIS — R2689 Other abnormalities of gait and mobility: Secondary | ICD-10-CM

## 2021-04-26 DIAGNOSIS — R262 Difficulty in walking, not elsewhere classified: Secondary | ICD-10-CM

## 2021-04-26 DIAGNOSIS — M6281 Muscle weakness (generalized): Secondary | ICD-10-CM

## 2021-04-26 DIAGNOSIS — M25661 Stiffness of right knee, not elsewhere classified: Secondary | ICD-10-CM
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
## 2021-04-27 DIAGNOSIS — Z86711 Personal history of pulmonary embolism: Secondary | ICD-10-CM | POA: Diagnosis not present

## 2021-04-27 DIAGNOSIS — I82502 Chronic embolism and thrombosis of unspecified deep veins of left lower extremity: Secondary | ICD-10-CM | POA: Diagnosis not present

## 2021-04-28 ENCOUNTER — Other Ambulatory Visit: Payer: Self-pay

## 2021-04-28 ENCOUNTER — Ambulatory Visit: Payer: Medicare HMO

## 2021-04-28 DIAGNOSIS — R2689 Other abnormalities of gait and mobility: Secondary | ICD-10-CM | POA: Diagnosis not present

## 2021-04-28 DIAGNOSIS — R262 Difficulty in walking, not elsewhere classified: Secondary | ICD-10-CM | POA: Diagnosis not present

## 2021-04-28 DIAGNOSIS — M25661 Stiffness of right knee, not elsewhere classified: Secondary | ICD-10-CM | POA: Diagnosis not present

## 2021-04-28 DIAGNOSIS — R600 Localized edema: Secondary | ICD-10-CM

## 2021-04-28 DIAGNOSIS — M25561 Pain in right knee: Secondary | ICD-10-CM | POA: Diagnosis not present

## 2021-04-28 DIAGNOSIS — M6281 Muscle weakness (generalized): Secondary | ICD-10-CM
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
## 2021-04-29 ENCOUNTER — Ambulatory Visit: Payer: Medicare HMO

## 2021-04-29 DIAGNOSIS — M25661 Stiffness of right knee, not elsewhere classified: Secondary | ICD-10-CM

## 2021-04-29 DIAGNOSIS — R2689 Other abnormalities of gait and mobility: Secondary | ICD-10-CM | POA: Diagnosis not present

## 2021-04-29 DIAGNOSIS — M6281 Muscle weakness (generalized): Secondary | ICD-10-CM

## 2021-04-29 DIAGNOSIS — R600 Localized edema: Secondary | ICD-10-CM

## 2021-04-29 DIAGNOSIS — R262 Difficulty in walking, not elsewhere classified: Secondary | ICD-10-CM

## 2021-04-29 DIAGNOSIS — M25561 Pain in right knee: Secondary | ICD-10-CM | POA: Diagnosis not present
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
## 2021-04-30 DIAGNOSIS — Z4889 Encounter for other specified surgical aftercare: Secondary | ICD-10-CM | POA: Diagnosis not present

## 2021-05-03 ENCOUNTER — Encounter: Payer: Self-pay | Admitting: Physical Therapy

## 2021-05-03 ENCOUNTER — Ambulatory Visit: Payer: Medicare HMO | Admitting: Physical Therapy

## 2021-05-03 ENCOUNTER — Other Ambulatory Visit: Payer: Self-pay

## 2021-05-03 DIAGNOSIS — M25561 Pain in right knee: Secondary | ICD-10-CM

## 2021-05-03 DIAGNOSIS — R2689 Other abnormalities of gait and mobility: Secondary | ICD-10-CM | POA: Diagnosis not present

## 2021-05-03 DIAGNOSIS — R600 Localized edema: Secondary | ICD-10-CM

## 2021-05-03 DIAGNOSIS — M25661 Stiffness of right knee, not elsewhere classified: Secondary | ICD-10-CM | POA: Diagnosis not present

## 2021-05-03 DIAGNOSIS — M6281 Muscle weakness (generalized): Secondary | ICD-10-CM | POA: Diagnosis not present

## 2021-05-03 DIAGNOSIS — R262 Difficulty in walking, not elsewhere classified: Secondary | ICD-10-CM
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
## 2021-05-03 NOTE — Patient Instructions (Signed)
   Access Code: [email or web address] URL: [email or web address] Date: 05/03/2021 Prepared by: Annie Paras  Exercises Supine Single Leg Ankle Pumps - 3 x daily - 7 x weekly - 2 sets - 10 reps - 3 sec hold Supine Quadricep Sets - 3 x daily - 7 x weekly - 2 sets - 10 reps - 5 sec hold Active Straight Leg Raise with Quad Set - 3 x daily - 7 x weekly - 2 sets - 10 reps - 3-5 sec hold Supine Heel Slide with Strap - 3 x daily - 7 x weekly - 2 sets - 10 reps - 3 sec hold Seated Heel Slide - 3 x daily - 7 x weekly - 2 sets - 10 reps - 3 sec hold Seated Long Arc Quad - 3 x daily - 7 x weekly - 2 sets - 10 reps - 3 sec hold Modified Thomas Stretch - 2-3 x daily - 7 x weekly - 3 reps - 30 sec hold Retro Step - 1 x daily - 7 x weekly - 2 sets - 10 reps - 3 sec hold Standing Terminal Knee Extension with Resistance - 1 x daily - 7 x weekly - 2 sets - 10 reps - 5 sec hold Standing Knee Flexion Stretch on Step - 1 x daily - 7 x weekly - 2 sets - 10 reps - 3 sec hold Standing Hip Flexion with Anchored Resistance and Chair Support - 1 x daily - 7 x weekly - 2 sets - 10 reps - 3 sec hold Standing Hip Adduction with Anchored Resistance - 1 x daily - 7 x weekly - 2 sets - 10 reps - 3 sec hold Standing Hip Extension with Anchored Resistance - 1 x daily - 7 x weekly - 2 sets - 10 reps - 3 sec hold Standing Hip Abduction with Anchored Resistance - 1 x daily - 7 x weekly - 2 sets - 10 reps - 3 sec hold

## 2021-05-06 ENCOUNTER — Other Ambulatory Visit: Payer: Self-pay

## 2021-05-06 ENCOUNTER — Ambulatory Visit: Payer: Medicare HMO | Attending: Specialist

## 2021-05-06 DIAGNOSIS — M25661 Stiffness of right knee, not elsewhere classified: Secondary | ICD-10-CM | POA: Diagnosis not present

## 2021-05-06 DIAGNOSIS — M6281 Muscle weakness (generalized): Secondary | ICD-10-CM | POA: Insufficient documentation

## 2021-05-06 DIAGNOSIS — R600 Localized edema: Secondary | ICD-10-CM | POA: Insufficient documentation

## 2021-05-06 DIAGNOSIS — R2689 Other abnormalities of gait and mobility: Secondary | ICD-10-CM | POA: Diagnosis not present

## 2021-05-06 DIAGNOSIS — R262 Difficulty in walking, not elsewhere classified: Secondary | ICD-10-CM | POA: Insufficient documentation

## 2021-05-06 DIAGNOSIS — M25561 Pain in right knee: Secondary | ICD-10-CM | POA: Insufficient documentation
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
## 2021-05-10 ENCOUNTER — Ambulatory Visit: Payer: Medicare HMO | Admitting: Physical Therapy

## 2021-05-10 DIAGNOSIS — Z08 Encounter for follow-up examination after completed treatment for malignant neoplasm: Secondary | ICD-10-CM | POA: Diagnosis not present

## 2021-05-10 DIAGNOSIS — Z8551 Personal history of malignant neoplasm of bladder: Secondary | ICD-10-CM | POA: Diagnosis not present

## 2021-05-13 ENCOUNTER — Other Ambulatory Visit: Payer: Self-pay

## 2021-05-13 ENCOUNTER — Ambulatory Visit: Payer: Medicare HMO

## 2021-05-13 DIAGNOSIS — M25561 Pain in right knee: Secondary | ICD-10-CM

## 2021-05-13 DIAGNOSIS — R2689 Other abnormalities of gait and mobility: Secondary | ICD-10-CM

## 2021-05-13 DIAGNOSIS — M25661 Stiffness of right knee, not elsewhere classified: Secondary | ICD-10-CM | POA: Diagnosis not present

## 2021-05-13 DIAGNOSIS — R600 Localized edema: Secondary | ICD-10-CM

## 2021-05-13 DIAGNOSIS — R262 Difficulty in walking, not elsewhere classified: Secondary | ICD-10-CM

## 2021-05-13 DIAGNOSIS — M6281 Muscle weakness (generalized): Secondary | ICD-10-CM | POA: Diagnosis not present
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
## 2021-05-17 ENCOUNTER — Ambulatory Visit: Payer: Medicare HMO | Admitting: Physical Therapy

## 2021-05-17 ENCOUNTER — Other Ambulatory Visit: Payer: Self-pay

## 2021-05-17 ENCOUNTER — Encounter: Payer: Self-pay | Admitting: Physical Therapy

## 2021-05-17 DIAGNOSIS — R600 Localized edema: Secondary | ICD-10-CM | POA: Diagnosis not present

## 2021-05-17 DIAGNOSIS — R262 Difficulty in walking, not elsewhere classified: Secondary | ICD-10-CM

## 2021-05-17 DIAGNOSIS — M6281 Muscle weakness (generalized): Secondary | ICD-10-CM | POA: Diagnosis not present

## 2021-05-17 DIAGNOSIS — M25561 Pain in right knee: Secondary | ICD-10-CM

## 2021-05-17 DIAGNOSIS — M25661 Stiffness of right knee, not elsewhere classified: Secondary | ICD-10-CM

## 2021-05-17 DIAGNOSIS — R2689 Other abnormalities of gait and mobility: Secondary | ICD-10-CM
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
## 2021-05-20 ENCOUNTER — Ambulatory Visit: Payer: Medicare HMO | Admitting: Physical Therapy

## 2021-05-25 DIAGNOSIS — I82502 Chronic embolism and thrombosis of unspecified deep veins of left lower extremity: Secondary | ICD-10-CM | POA: Diagnosis not present

## 2021-05-25 DIAGNOSIS — R791 Abnormal coagulation profile: Secondary | ICD-10-CM | POA: Diagnosis not present

## 2021-05-25 DIAGNOSIS — Z86711 Personal history of pulmonary embolism: Secondary | ICD-10-CM | POA: Diagnosis not present

## 2021-05-26 ENCOUNTER — Ambulatory Visit: Payer: Medicare HMO

## 2021-05-26 ENCOUNTER — Other Ambulatory Visit: Payer: Self-pay

## 2021-05-26 ENCOUNTER — Other Ambulatory Visit: Payer: Self-pay | Admitting: Family Medicine

## 2021-05-26 DIAGNOSIS — M25561 Pain in right knee: Secondary | ICD-10-CM | POA: Diagnosis not present

## 2021-05-26 DIAGNOSIS — M25661 Stiffness of right knee, not elsewhere classified: Secondary | ICD-10-CM

## 2021-05-26 DIAGNOSIS — M6281 Muscle weakness (generalized): Secondary | ICD-10-CM

## 2021-05-26 DIAGNOSIS — R600 Localized edema: Secondary | ICD-10-CM

## 2021-05-26 DIAGNOSIS — R2689 Other abnormalities of gait and mobility: Secondary | ICD-10-CM

## 2021-05-26 DIAGNOSIS — R262 Difficulty in walking, not elsewhere classified: Secondary | ICD-10-CM
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
## 2021-05-28 DIAGNOSIS — I82502 Chronic embolism and thrombosis of unspecified deep veins of left lower extremity: Secondary | ICD-10-CM | POA: Diagnosis not present

## 2021-05-28 DIAGNOSIS — Z86711 Personal history of pulmonary embolism: Secondary | ICD-10-CM | POA: Diagnosis not present

## 2021-05-30 ENCOUNTER — Emergency Department (HOSPITAL_BASED_OUTPATIENT_CLINIC_OR_DEPARTMENT_OTHER)
Admission: EM | Admit: 2021-05-30 | Discharge: 2021-05-30 | Disposition: A | Discharge status: Home / Self Care | Payer: Medicare HMO | Attending: Emergency Medicine | Admitting: Emergency Medicine

## 2021-05-30 ENCOUNTER — Encounter (HOSPITAL_BASED_OUTPATIENT_CLINIC_OR_DEPARTMENT_OTHER): Payer: Self-pay | Admitting: *Deleted

## 2021-05-30 ENCOUNTER — Emergency Department (HOSPITAL_BASED_OUTPATIENT_CLINIC_OR_DEPARTMENT_OTHER): Payer: Medicare HMO

## 2021-05-30 ENCOUNTER — Other Ambulatory Visit: Payer: Self-pay

## 2021-05-30 DIAGNOSIS — Z7982 Long term (current) use of aspirin: Secondary | ICD-10-CM | POA: Insufficient documentation

## 2021-05-30 DIAGNOSIS — Z87891 Personal history of nicotine dependence: Secondary | ICD-10-CM | POA: Insufficient documentation

## 2021-05-30 DIAGNOSIS — J449 Chronic obstructive pulmonary disease, unspecified: Secondary | ICD-10-CM | POA: Insufficient documentation

## 2021-05-30 DIAGNOSIS — Z8616 Personal history of COVID-19: Secondary | ICD-10-CM | POA: Diagnosis not present

## 2021-05-30 DIAGNOSIS — I10 Essential (primary) hypertension: Secondary | ICD-10-CM | POA: Insufficient documentation

## 2021-05-30 DIAGNOSIS — Z8551 Personal history of malignant neoplasm of bladder: Secondary | ICD-10-CM | POA: Diagnosis not present

## 2021-05-30 DIAGNOSIS — M545 Low back pain, unspecified: Secondary | ICD-10-CM | POA: Insufficient documentation

## 2021-05-30 DIAGNOSIS — Z79899 Other long term (current) drug therapy: Secondary | ICD-10-CM | POA: Insufficient documentation

## 2021-05-30 DIAGNOSIS — Z96651 Presence of right artificial knee joint: Secondary | ICD-10-CM | POA: Insufficient documentation

## 2021-05-30 MED ORDER — LIDOCAINE 5 % EX PTCH: 1.0000 | MEDICATED_PATCH | CUTANEOUS | 0 refills | Status: AC

## 2021-05-30 MED ORDER — METHOCARBAMOL 500 MG PO TABS: 500.0000 mg | ORAL_TABLET | Freq: Two times a day (BID) | ORAL | 0 refills | Status: AC

## 2021-05-30 MED ORDER — PREDNISONE 20 MG PO TABS: 40.0000 mg | ORAL_TABLET | Freq: Every day | ORAL | 0 refills | Status: AC

## 2021-05-30 MED ORDER — HYDROCODONE-ACETAMINOPHEN 5-325 MG PO TABS
1.0000 | ORAL_TABLET | Freq: Once | ORAL | Status: AC
Start: 2021-05-30 — End: 2021-05-30
  Administered 2021-05-30: 11:00:00 1 via ORAL
  Filled 2021-05-30: qty 1

## 2021-05-30 MED ORDER — METHOCARBAMOL 500 MG PO TABS
750.0000 mg | ORAL_TABLET | Freq: Once | ORAL | Status: AC
  Administered 2021-05-30: 11:00:00 750 mg via ORAL
  Filled 2021-05-30: qty 2

## 2021-05-30 MED ORDER — LIDOCAINE 5 % EX PTCH
1.0000 | MEDICATED_PATCH | CUTANEOUS | Status: DC
  Administered 2021-05-30: 11:00:00 1 via TRANSDERMAL
  Filled 2021-05-30: qty 1
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
## 2021-05-30 NOTE — Discharge Instructions (Addendum)
I have prescribed muscle relaxers for your pain, please do not drink or drive while taking this medications as it can make you drowsy.   I have also prescribed steroids, be aware this medication can cause insomnia, appetite changes.    Please follow-up with PCP in 1 week for reevaluation of your symptoms.If you experience any bowel or bladder incontinence, fever, worsening in your symptoms please return to the ED.

## 2021-05-30 NOTE — ED Triage Notes (Signed)
BIB spouse from home for L lower back pain, reports pain only, denies other sx, no radiation. H/o bad back and DDD. Recent R knee surgery, reports thinks PT on Wednesday triggered back pain. No meds for pain PTA.

## 2021-05-30 NOTE — ED Notes (Addendum)
Pt transferred from w/c to stretcher on his own, ambulatory, bears weight well. Describes L lower back pain as chronic flared by PT. Alert, NAD, calm.

## 2021-06-02 ENCOUNTER — Encounter: Payer: Self-pay | Admitting: Family Medicine

## 2021-06-02 ENCOUNTER — Ambulatory Visit: Payer: Medicare HMO

## 2021-06-02 ENCOUNTER — Other Ambulatory Visit: Payer: Self-pay

## 2021-06-02 DIAGNOSIS — M25661 Stiffness of right knee, not elsewhere classified: Secondary | ICD-10-CM

## 2021-06-02 DIAGNOSIS — M25561 Pain in right knee: Secondary | ICD-10-CM | POA: Diagnosis not present

## 2021-06-02 DIAGNOSIS — R600 Localized edema: Secondary | ICD-10-CM

## 2021-06-02 DIAGNOSIS — M6281 Muscle weakness (generalized): Secondary | ICD-10-CM

## 2021-06-02 DIAGNOSIS — R2689 Other abnormalities of gait and mobility: Secondary | ICD-10-CM

## 2021-06-02 DIAGNOSIS — R262 Difficulty in walking, not elsewhere classified: Secondary | ICD-10-CM
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
## 2021-06-04 ENCOUNTER — Telehealth: Payer: Self-pay | Admitting: Medical

## 2021-06-04 ENCOUNTER — Ambulatory Visit (INDEPENDENT_AMBULATORY_CARE_PROVIDER_SITE_OTHER): Payer: Medicare HMO | Admitting: Medical

## 2021-06-04 ENCOUNTER — Encounter: Payer: Self-pay | Admitting: Medical

## 2021-06-04 VITALS — BP 122/70 | HR 70 | Resp 18 | Ht 70.0 in | Wt 210.0 lb

## 2021-06-04 DIAGNOSIS — R222 Localized swelling, mass and lump, trunk: Secondary | ICD-10-CM | POA: Diagnosis not present

## 2021-06-04 DIAGNOSIS — T148XXA Other injury of unspecified body region, initial encounter: Secondary | ICD-10-CM

## 2021-06-04 DIAGNOSIS — N62 Hypertrophy of breast: Secondary | ICD-10-CM | POA: Diagnosis not present

## 2021-06-04 DIAGNOSIS — R748 Abnormal levels of other serum enzymes: Secondary | ICD-10-CM | POA: Diagnosis not present

## 2021-06-04 DIAGNOSIS — I10 Essential (primary) hypertension: Secondary | ICD-10-CM

## 2021-06-04 LAB — COMPREHENSIVE METABOLIC PANEL
ALT: 15 U/L (ref 0–53)
AST: 19 U/L (ref 0–37)
Albumin: 4 g/dL (ref 3.5–5.2)
Alkaline Phosphatase: 54 U/L (ref 39–117)
BUN: 15 mg/dL (ref 6–23)
CO2: 26 mEq/L (ref 19–32)
Calcium: 9.8 mg/dL (ref 8.4–10.5)
Chloride: 102 mEq/L (ref 96–112)
Creatinine, Ser: 1.06 mg/dL (ref 0.40–1.50)
GFR: 67.18 mL/min (ref >60.00)
Glucose, Bld: 100 mg/dL — ABNORMAL HIGH (ref 70–99)
Potassium: 4.2 mEq/L (ref 3.5–5.1)
Sodium: 136 mEq/L (ref 135–145)
Total Bilirubin: 0.5 mg/dL (ref 0.2–1.2)
Total Protein: 7.5 g/dL (ref 6.0–8.3)

## 2021-06-04 LAB — CBC WITH DIFFERENTIAL/PLATELET
Basophils Absolute: 0 10*3/uL (ref 0.0–0.1)
Basophils Relative: 0.5 % (ref 0.0–3.0)
Eosinophils Absolute: 0.1 10*3/uL (ref 0.0–0.7)
Eosinophils Relative: 2.2 % (ref 0.0–5.0)
HCT: 38.2 % — ABNORMAL LOW (ref 39.0–52.0)
Hemoglobin: 12.6 g/dL — ABNORMAL LOW (ref 13.0–17.0)
Lymphocytes Relative: 22.4 % (ref 12.0–46.0)
Lymphs Abs: 1.4 10*3/uL (ref 0.7–4.0)
MCHC: 32.9 g/dL (ref 30.0–36.0)
MCV: 84.3 fl (ref 78.0–100.0)
Monocytes Absolute: 0.5 10*3/uL (ref 0.1–1.0)
Monocytes Relative: 7.8 % (ref 3.0–12.0)
Neutro Abs: 4.1 10*3/uL (ref 1.4–7.7)
Neutrophils Relative %: 67.1 % (ref 43.0–77.0)
Platelets: 259 10*3/uL (ref 150.0–400.0)
RBC: 4.53 Mil/uL (ref 4.22–5.81)
RDW: 15.1 % (ref 11.5–15.5)
WBC: 6.1 10*3/uL (ref 4.0–10.5)
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
## 2021-06-04 NOTE — Patient Instructions (Addendum)
Bilateral gynecomastia worse on the right side than the left.  On palpation of right nipple do feel moderate sized lump.  Also bruising present.  We will go ahead and place diagnostic mammogram order.  I sent message to staff to investigate whether prior authorization needed.  On review elevated liver enzymes so we will repeat metabolic panel.  For recent her breathing with no obvious cause we will get CBC.  Hypertension well controlled.  Much improved level on second recheck.  Continue current BP medication.  Expect that you get a call to schedule for the mammogram. If 10 days pass and no call then breast center number is [phone].  Follow up date to be determined after results of studies.

## 2021-06-04 NOTE — Telephone Encounter (Signed)
Male pt. Mammogram placed. I am assuming needs prior auth. Diagnostic. Let me know that is authorized.

## 2021-06-08 ENCOUNTER — Encounter: Payer: Medicare HMO | Admitting: Physical Therapy

## 2021-06-10 ENCOUNTER — Encounter: Payer: Self-pay | Admitting: Physical Therapy

## 2021-06-10 ENCOUNTER — Ambulatory Visit: Payer: Medicare HMO | Attending: Specialist | Admitting: Physical Therapy

## 2021-06-10 ENCOUNTER — Other Ambulatory Visit: Payer: Self-pay

## 2021-06-10 ENCOUNTER — Encounter: Payer: Self-pay | Admitting: Family Medicine

## 2021-06-10 DIAGNOSIS — R2689 Other abnormalities of gait and mobility: Secondary | ICD-10-CM | POA: Insufficient documentation

## 2021-06-10 DIAGNOSIS — R262 Difficulty in walking, not elsewhere classified: Secondary | ICD-10-CM | POA: Insufficient documentation

## 2021-06-10 DIAGNOSIS — N62 Hypertrophy of breast: Secondary | ICD-10-CM

## 2021-06-10 DIAGNOSIS — M25561 Pain in right knee: Secondary | ICD-10-CM | POA: Insufficient documentation

## 2021-06-10 DIAGNOSIS — M25661 Stiffness of right knee, not elsewhere classified: Secondary | ICD-10-CM | POA: Diagnosis not present

## 2021-06-10 DIAGNOSIS — M6281 Muscle weakness (generalized): Secondary | ICD-10-CM | POA: Insufficient documentation

## 2021-06-10 DIAGNOSIS — R600 Localized edema: Secondary | ICD-10-CM | POA: Insufficient documentation

## 2021-06-10 NOTE — Patient Instructions (Signed)
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
## 2021-06-12 ENCOUNTER — Other Ambulatory Visit: Payer: Self-pay | Admitting: Medical

## 2021-06-12 DIAGNOSIS — N62 Hypertrophy of breast: Secondary | ICD-10-CM

## 2021-06-14 ENCOUNTER — Other Ambulatory Visit: Payer: Self-pay | Admitting: Family Medicine

## 2021-06-16 ENCOUNTER — Ambulatory Visit: Payer: Medicare HMO | Admitting: Physical Therapy

## 2021-06-16 ENCOUNTER — Other Ambulatory Visit: Payer: Self-pay

## 2021-06-16 ENCOUNTER — Encounter: Payer: Self-pay | Admitting: Physical Therapy

## 2021-06-16 DIAGNOSIS — R2689 Other abnormalities of gait and mobility: Secondary | ICD-10-CM

## 2021-06-16 DIAGNOSIS — Z4889 Encounter for other specified surgical aftercare: Secondary | ICD-10-CM | POA: Diagnosis not present

## 2021-06-16 DIAGNOSIS — R600 Localized edema: Secondary | ICD-10-CM | POA: Diagnosis not present

## 2021-06-16 DIAGNOSIS — R262 Difficulty in walking, not elsewhere classified: Secondary | ICD-10-CM | POA: Diagnosis not present

## 2021-06-16 DIAGNOSIS — M25661 Stiffness of right knee, not elsewhere classified: Secondary | ICD-10-CM

## 2021-06-16 DIAGNOSIS — M25561 Pain in right knee: Secondary | ICD-10-CM

## 2021-06-16 DIAGNOSIS — M6281 Muscle weakness (generalized): Secondary | ICD-10-CM | POA: Diagnosis not present
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
## 2021-06-23 DIAGNOSIS — R791 Abnormal coagulation profile: Secondary | ICD-10-CM | POA: Diagnosis not present

## 2021-06-23 DIAGNOSIS — Z86711 Personal history of pulmonary embolism: Secondary | ICD-10-CM | POA: Diagnosis not present

## 2021-06-23 DIAGNOSIS — I82502 Chronic embolism and thrombosis of unspecified deep veins of left lower extremity: Secondary | ICD-10-CM | POA: Diagnosis not present

## 2021-07-07 DIAGNOSIS — Z86711 Personal history of pulmonary embolism: Secondary | ICD-10-CM | POA: Diagnosis not present

## 2021-07-07 DIAGNOSIS — I82502 Chronic embolism and thrombosis of unspecified deep veins of left lower extremity: Secondary | ICD-10-CM | POA: Diagnosis not present

## 2021-07-26 ENCOUNTER — Other Ambulatory Visit: Payer: Self-pay | Admitting: Medical

## 2021-07-26 ENCOUNTER — Encounter: Payer: Self-pay | Admitting: Family Medicine

## 2021-07-26 ENCOUNTER — Ambulatory Visit
Admission: RE | Admit: 2021-07-26 | Discharge: 2021-07-26 | Disposition: A | Discharge status: Home / Self Care | Payer: Medicare HMO | Source: Ambulatory Visit | Attending: Medical | Admitting: Medical

## 2021-07-26 ENCOUNTER — Other Ambulatory Visit: Payer: Self-pay

## 2021-07-26 DIAGNOSIS — N62 Hypertrophy of breast: Secondary | ICD-10-CM

## 2021-07-26 DIAGNOSIS — R928 Other abnormal and inconclusive findings on diagnostic imaging of breast: Secondary | ICD-10-CM | POA: Diagnosis not present

## 2021-07-26 DIAGNOSIS — N6489 Other specified disorders of breast: Secondary | ICD-10-CM | POA: Diagnosis not present

## 2021-07-27 NOTE — Telephone Encounter (Signed)
Patient requesting to change from Warfarin to Eliquis due to cost.  INRs have been maintained at [facility] through [facility].  H/O pulmonary embolism and leg DVT.

## 2021-07-30 NOTE — Telephone Encounter (Signed)
Lvm to call back to schedule for Wednesday at 2:30 vv

## 2021-08-09 ENCOUNTER — Ambulatory Visit (INDEPENDENT_AMBULATORY_CARE_PROVIDER_SITE_OTHER): Payer: Medicare HMO

## 2021-08-09 ENCOUNTER — Telehealth: Payer: Self-pay

## 2021-08-09 VITALS — BP 104/66 | HR 72 | Temp 98.7°F | Resp 16 | Ht 70.0 in | Wt 211.0 lb

## 2021-08-09 DIAGNOSIS — Z0000 Encounter for general adult medical examination without abnormal findings: Secondary | ICD-10-CM | POA: Diagnosis not present

## 2021-08-09 NOTE — Telephone Encounter (Signed)
Spoke with daughter and she states that he wanted to change from coumadin to eliquis and she ask the people that are managing the medication and they told them it has to come from the pcp.
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
## 2021-08-09 NOTE — Telephone Encounter (Signed)
Patient is requesting refill of Warfarin. States the pharmacy says to contact the provider for refill.  He says he took his last pill last night. Uses [facility].

## 2021-08-09 NOTE — Patient Instructions (Signed)
[name] , Thank you for taking time to come for your Medicare Wellness Visit. I appreciate your ongoing commitment to your health goals. Please review the following plan we discussed and let me know if I can assist you in the future.   Screening recommendations/referrals: Colonoscopy: No longer required Recommended yearly ophthalmology/optometry visit for glaucoma screening and checkup Recommended yearly dental visit for hygiene and checkup  Vaccinations: Influenza vaccine: Up to date Pneumococcal vaccine: Up to date Tdap vaccine: Due-May obtain vaccine at our office or your local pharmacy. Shingles vaccine: Due-May obtain vaccine at your local pharmacy. Covid-19: Declined  Advanced directives: Declined information today  Conditions/risks identified: See problem list  Next appointment: Follow up in one year for your annual wellness visit. 08/11/2022 @ 11:40  Preventive Care 79 Years and Older, Male Preventive care refers to lifestyle choices and visits with your health care provider that can promote health and wellness. What does preventive care include? A yearly physical exam. This is also called an annual well check. Dental exams once or twice a year. Routine eye exams. Ask your health care provider how often you should have your eyes checked. Personal lifestyle choices, including: Daily care of your teeth and gums. Regular physical activity. Eating a healthy diet. Avoiding tobacco and drug use. Limiting alcohol use. Practicing safe sex. Taking low doses of aspirin every day. Taking vitamin and mineral supplements as recommended by your health care provider. What happens during an annual well check? The services and screenings done by your health care provider during your annual well check will depend on your age, overall health, lifestyle risk factors, and family history of disease. Counseling  Your health care provider may ask you questions about your: Alcohol  use. Tobacco use. Drug use. Emotional well-being. Home and relationship well-being. Sexual activity. Eating habits. History of falls. Memory and ability to understand (cognition). Work and work Statistician. Screening  You may have the following tests or measurements: Height, weight, and BMI. Blood pressure. Lipid and cholesterol levels. These may be checked every 5 years, or more frequently if you are over 38 years old. Skin check. Lung cancer screening. You may have this screening every year starting at age 34 if you have a 30-pack-year history of smoking and currently smoke or have quit within the past 15 years. Fecal occult blood test (FOBT) of the stool. You may have this test every year starting at age 30. Flexible sigmoidoscopy or colonoscopy. You may have a sigmoidoscopy every 5 years or a colonoscopy every 10 years starting at age 42. Prostate cancer screening. Recommendations will vary depending on your family history and other risks. Hepatitis C blood test. Hepatitis B blood test. Sexually transmitted disease (STD) testing. Diabetes screening. This is done by checking your blood sugar (glucose) after you have not eaten for a while (fasting). You may have this done every 1-3 years. Abdominal aortic aneurysm (AAA) screening. You may need this if you are a current or former smoker. Osteoporosis. You may be screened starting at age 51 if you are at high risk. Talk with your health care provider about your test results, treatment options, and if necessary, the need for more tests. Vaccines  Your health care provider may recommend certain vaccines, such as: Influenza vaccine. This is recommended every year. Tetanus, diphtheria, and acellular pertussis (Tdap, Td) vaccine. You may need a Td booster every 10 years. Zoster vaccine. You may need this after age 14. Pneumococcal 13-valent conjugate (PCV13) vaccine. One dose is recommended after  age 30. Pneumococcal polysaccharide  (PPSV23) vaccine. One dose is recommended after age 14. Talk to your health care provider about which screenings and vaccines you need and how often you need them. This information is not intended to replace advice given to you by your health care provider. Make sure you discuss any questions you have with your health care provider. Document Released: 07/17/2015 Document Revised: 03/09/2016 Document Reviewed: 04/21/2015 Elsevier Interactive Patient Education  2017 [place] Prevention in the Home Falls can cause injuries. They can happen to people of all ages. There are many things you can do to make your home safe and to help prevent falls. What can I do on the outside of my home? Regularly fix the edges of walkways and driveways and fix any cracks. Remove anything that might make you trip as you walk through a door, such as a raised step or threshold. Trim any bushes or trees on the path to your home. Use bright outdoor lighting. Clear any walking paths of anything that might make someone trip, such as rocks or tools. Regularly check to see if handrails are loose or broken. Make sure that both sides of any steps have handrails. Any raised decks and porches should have guardrails on the edges. Have any leaves, snow, or ice cleared regularly. Use sand or salt on walking paths during winter. Clean up any spills in your garage right away. This includes oil or grease spills. What can I do in the bathroom? Use night lights. Install grab bars by the toilet and in the tub and shower. Do not use towel bars as grab bars. Use non-skid mats or decals in the tub or shower. If you need to sit down in the shower, use a plastic, non-slip stool. Keep the floor dry. Clean up any water that spills on the floor as soon as it happens. Remove soap buildup in the tub or shower regularly. Attach bath mats securely with double-sided non-slip rug tape. Do not have throw rugs and other things on the  floor that can make you trip. What can I do in the bedroom? Use night lights. Make sure that you have a light by your bed that is easy to reach. Do not use any sheets or blankets that are too big for your bed. They should not hang down onto the floor. Have a firm chair that has side arms. You can use this for support while you get dressed. Do not have throw rugs and other things on the floor that can make you trip. What can I do in the kitchen? Clean up any spills right away. Avoid walking on wet floors. Keep items that you use a lot in easy-to-reach places. If you need to reach something above you, use a strong step stool that has a grab bar. Keep electrical cords out of the way. Do not use floor polish or wax that makes floors slippery. If you must use wax, use non-skid floor wax. Do not have throw rugs and other things on the floor that can make you trip. What can I do with my stairs? Do not leave any items on the stairs. Make sure that there are handrails on both sides of the stairs and use them. Fix handrails that are broken or loose. Make sure that handrails are as long as the stairways. Check any carpeting to make sure that it is firmly attached to the stairs. Fix any carpet that is loose or worn. Avoid having throw rugs  at the top or bottom of the stairs. If you do have throw rugs, attach them to the floor with carpet tape. Make sure that you have a light switch at the top of the stairs and the bottom of the stairs. If you do not have them, ask someone to add them for you. What else can I do to help prevent falls? Wear shoes that: Do not have high heels. Have rubber bottoms. Are comfortable and fit you well. Are closed at the toe. Do not wear sandals. If you use a stepladder: Make sure that it is fully opened. Do not climb a closed stepladder. Make sure that both sides of the stepladder are locked into place. Ask someone to hold it for you, if possible. Clearly mark and make  sure that you can see: Any grab bars or handrails. First and last steps. Where the edge of each step is. Use tools that help you move around (mobility aids) if they are needed. These include: Canes. Walkers. Scooters. Crutches. Turn on the lights when you go into a dark area. Replace any light bulbs as soon as they burn out. Set up your furniture so you have a clear path. Avoid moving your furniture around. If any of your floors are uneven, fix them. If there are any pets around you, be aware of where they are. Review your medicines with your doctor. Some medicines can make you feel dizzy. This can increase your chance of falling. Ask your doctor what other things that you can do to help prevent falls. This information is not intended to replace advice given to you by your health care provider. Make sure you discuss any questions you have with your health care provider. Document Released: 04/16/2009 Document Revised: 11/26/2015 Document Reviewed: 07/25/2014 Elsevier Interactive Patient Education  2017 [employer].

## 2021-08-10 ENCOUNTER — Encounter: Payer: Self-pay | Admitting: Family Medicine

## 2021-08-10 DIAGNOSIS — Z86711 Personal history of pulmonary embolism: Secondary | ICD-10-CM | POA: Diagnosis not present

## 2021-08-10 DIAGNOSIS — I82502 Chronic embolism and thrombosis of unspecified deep veins of left lower extremity: Secondary | ICD-10-CM | POA: Diagnosis not present

## 2021-08-11 ENCOUNTER — Ambulatory Visit (HOSPITAL_BASED_OUTPATIENT_CLINIC_OR_DEPARTMENT_OTHER)
Admission: RE | Admit: 2021-08-11 | Discharge: 2021-08-11 | Disposition: A | Discharge status: Home / Self Care | Payer: Medicare HMO | Source: Ambulatory Visit | Attending: Family | Admitting: Family

## 2021-08-11 ENCOUNTER — Other Ambulatory Visit: Payer: Self-pay

## 2021-08-11 ENCOUNTER — Ambulatory Visit (INDEPENDENT_AMBULATORY_CARE_PROVIDER_SITE_OTHER): Payer: Medicare HMO | Admitting: Family

## 2021-08-11 VITALS — BP 112/52 | HR 67 | Temp 98.1°F | Resp 16 | Wt 210.0 lb

## 2021-08-11 DIAGNOSIS — M109 Gout, unspecified: Secondary | ICD-10-CM | POA: Diagnosis not present

## 2021-08-11 DIAGNOSIS — R059 Cough, unspecified: Secondary | ICD-10-CM | POA: Insufficient documentation

## 2021-08-11 DIAGNOSIS — J449 Chronic obstructive pulmonary disease, unspecified: Secondary | ICD-10-CM

## 2021-08-11 DIAGNOSIS — Z8739 Personal history of other diseases of the musculoskeletal system and connective tissue: Secondary | ICD-10-CM | POA: Insufficient documentation

## 2021-08-11 MED ORDER — PREDNISONE 10 MG PO TABS: ORAL_TABLET | ORAL | 0 refills | Status: DC

## 2021-08-11 MED ORDER — BENZONATATE 100 MG PO CAPS: 100.0000 mg | ORAL_CAPSULE | Freq: Two times a day (BID) | ORAL | 0 refills | Status: DC | PRN

## 2021-08-11 NOTE — Telephone Encounter (Signed)
Lvm to call back

## 2021-08-11 NOTE — Assessment & Plan Note (Signed)
New. Will rx with prednisone taper.

## 2021-08-11 NOTE — Assessment & Plan Note (Signed)
Cough is likely mild exacerbation. Continue Stiolto, will rx with prednisone taper, tessalon perles. Pt will also complete a CXR on the first floor to exclude pneumonia.
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
## 2021-08-11 NOTE — Patient Instructions (Signed)
Please begin prednisone. This should help with your cough and gout. Let us know if symptoms worsen or if symptoms are not improved in 4-5 days. Complete chest x-ray on the first floor so we can check you for pneumonia.

## 2021-08-13 ENCOUNTER — Telehealth: Payer: Self-pay | Admitting: *Deleted

## 2021-08-13 ENCOUNTER — Ambulatory Visit (INDEPENDENT_AMBULATORY_CARE_PROVIDER_SITE_OTHER): Payer: Medicare HMO | Admitting: *Deleted

## 2021-08-13 DIAGNOSIS — Z7901 Long term (current) use of anticoagulants: Secondary | ICD-10-CM

## 2021-08-13 LAB — POCT INR: INR: 1.9 — AB (ref 2.0–3.0)

## 2021-08-13 MED ORDER — APIXABAN 5 MG PO TABS: 5.0000 mg | ORAL_TABLET | Freq: Two times a day (BID) | ORAL | 1 refills | Status: DC

## 2021-08-13 NOTE — Progress Notes (Signed)
Pt here for INR check per Charlett Blake.   Goal INR =2-3  Last INR =2.4  Pt  is currently not on any medication because he is going to be changing to a new medication.  Pt denies recent antibiotics, no dietary changes and no unusual bruising / bleeding.  INR today = 1.9  Pt advised per Dr. Kyra Searles. Nani Ravens ok to start Eiliqus 1 tablet twice a day,  Patient has follow up with Dr. Charlett Blake in March.

## 2021-08-13 NOTE — Telephone Encounter (Signed)
Patient came in for INR check and he was changed over to the Eliquis. The note should be coming to you but when we put in the medication it had a contraindication with aspirin.  Can he take aspirin with the Eliquis?  Patient also has a follow up in March.

## 2021-08-16 NOTE — Telephone Encounter (Signed)
Patient daughter notified and they will contact cardiology.

## 2021-09-13 ENCOUNTER — Other Ambulatory Visit: Payer: Self-pay | Admitting: Family Medicine

## 2021-09-15 ENCOUNTER — Telehealth: Payer: Self-pay

## 2021-09-15 NOTE — Telephone Encounter (Signed)
Called pt to reschedule for a later time on 08/11/21 ?

## 2021-09-27 ENCOUNTER — Encounter: Payer: Self-pay | Admitting: Family Medicine

## 2021-09-27 ENCOUNTER — Ambulatory Visit (INDEPENDENT_AMBULATORY_CARE_PROVIDER_SITE_OTHER): Payer: Medicare HMO | Admitting: Family Medicine

## 2021-09-27 VITALS — BP 140/80 | HR 74 | Resp 20 | Ht 70.0 in | Wt 217.0 lb

## 2021-09-27 DIAGNOSIS — I2699 Other pulmonary embolism without acute cor pulmonale: Secondary | ICD-10-CM

## 2021-09-27 DIAGNOSIS — I10 Essential (primary) hypertension: Secondary | ICD-10-CM | POA: Diagnosis not present

## 2021-09-27 DIAGNOSIS — Z8249 Family history of ischemic heart disease and other diseases of the circulatory system: Secondary | ICD-10-CM

## 2021-09-27 DIAGNOSIS — R011 Cardiac murmur, unspecified: Secondary | ICD-10-CM | POA: Diagnosis not present

## 2021-09-27 DIAGNOSIS — E7800 Pure hypercholesterolemia, unspecified: Secondary | ICD-10-CM | POA: Diagnosis not present

## 2021-09-27 DIAGNOSIS — D649 Anemia, unspecified: Secondary | ICD-10-CM

## 2021-09-27 DIAGNOSIS — E669 Obesity, unspecified: Secondary | ICD-10-CM | POA: Diagnosis not present

## 2021-09-27 DIAGNOSIS — R7303 Prediabetes: Secondary | ICD-10-CM

## 2021-09-27 NOTE — Assessment & Plan Note (Signed)
hgba1c acceptable, minimize simple carbs. Increase exercise as tolerated.  
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
## 2021-09-27 NOTE — Assessment & Plan Note (Signed)
Patient is tolerating Eliquis, he has personally only had one PE but he reports both of his parents and his brother all have had them as well. Is referred to Hematology for consideration of a hematologic condition causing clotting. ?

## 2021-09-27 NOTE — Assessment & Plan Note (Signed)
Well controlled, no changes to meds. Encouraged heart healthy diet such as the DASH diet and exercise as tolerated.  °

## 2021-09-27 NOTE — Assessment & Plan Note (Signed)
Encouraged DASH or MIND diet, decrease po intake and increase exercise as tolerated. Needs 7-8 hours of sleep nightly. Avoid trans fats, eat small, frequent meals every 4-5 hours with lean proteins, complex carbs and healthy fats. Minimize simple carbs, high fat foods and processed foods 

## 2021-09-27 NOTE — Patient Instructions (Signed)
Follow up with cardiology annually  ? ? ?Pulmonary Embolism ?A pulmonary embolism (PE) is a sudden blockage or decrease of blood flow in one or both lungs that happens when a clot travels into the arteries of the lung (pulmonary arteries). Most blockages come from a blood clot that forms in the vein of a leg or arm (deep vein thrombosis, DVT) and travels to the lungs. A clot is blood that has thickened into a gel or solid. PE is a dangerous and life-threatening condition that needs to be treated right away. ?What are the causes? ?This condition is usually caused by a blood clot that forms in a vein and moves to the lungs. In rare cases, it may be caused by air, fat, part of a tumor, or other tissue that moves through the veins and into the lungs. ?What increases the risk? ?The following factors may make you more likely to develop this condition: ?Experiencing a traumatic injury, such as breaking a hip or leg. ?Having: ?A spinal cord injury. ?Major surgery, especially hip or knee replacement, or surgery on parts of the nervous system or on the abdomen. ?A stroke. ?A blood-clotting disease. ?Long-term (chronic) lung or heart disease. ?Cancer, especially if you are being treated with chemotherapy. ?A central venous catheter. ?Taking medicines that contain estrogen. These include birth control pills and hormone replacement therapy. ?Being: ?Pregnant. ?In the period of time after your baby is delivered (postpartum). ?Older than age 58. ?Overweight. ?A smoker, especially if you have other risks. ?Not very active (sedentary), not being able to move at all, or spending long periods sitting, such as travel over 6 hours. You are also at a greater risk if you have a leg in a cast or splint. ?What are the signs or symptoms? ?Symptoms of this condition usually start suddenly and include: ?Shortness of breath during activity or at rest. ?Coughing, coughing up blood, or coughing up bloody mucus. ?Chest pain, back pain, or shoulder  blade pain that gets worse with deep breaths. ?Rapid or irregular heartbeat. ?Feeling light-headed or dizzy, or fainting. ?Feeling anxious. ?Pain and swelling in a leg. This is a symptom of DVT, which can lead to PE. ?How is this diagnosed? ?This condition may be diagnosed based on your medical history, a physical exam, and tests. Tests may include: ?Blood tests. ?An ECG (electrocardiogram) of the heart. ?A CT pulmonary angiogram. This test checks blood flow in and around your lungs. ?A ventilation-perfusion scan, also called a lung VQ scan. This test measures air flow and blood flow to the lungs. ?An ultrasound to check for a DVT. ?How is this treated? ?Treatment for this condition depends on many factors, such as the cause of your PE, your risk for bleeding or developing more clots, and other medical conditions you may have. Treatment aims to stop blood clots from forming or growing larger. In some cases, treatment may be aimed at breaking apart or removing the blood clot. Treatment may include: ?Medicines, such as: ?Blood thinning medicines, also called anticoagulants, to stop clots from forming and growing. ?Medicines that break apart clots (fibrinolytics). ?Procedures, such as: ?Using a flexible tube to remove a blood clot (embolectomy) or to deliver medicine to destroy it (catheter-directed thrombolysis). ?Surgery to remove the clot (surgical embolectomy). This is rare. ?You may need a combination of immediate, long-term, and extended treatments. Your treatment may continue for several months (maintenance therapy) or longer depending on your medical conditions. You and your health care provider will work together to choose the  treatment program that is best for you. ?Follow these instructions at home: ?Medicines ?Take over-the-counter and prescription medicines only as told by your health care provider. ?If you are taking blood thinners: ?Talk with your health care provider before you take any medicines that  contain aspirin or NSAIDs, such as ibuprofen. These medicines increase your risk for dangerous bleeding. ?Take your medicine exactly as told, at the same time every day. ?Avoid activities that could cause injury or bruising, and follow instructions about how to prevent falls. ?Wear a medical alert bracelet or carry a card that lists what medicines you take. ?Understand what foods and drugs interact with any medicines that you are taking. ?General instructions ?Ask your health care provider when you may return to your normal activities. Avoid sitting or lying for a long time without moving. ?Maintain a healthy weight. Ask your health care provider what weight is healthy for you. ?Do not use any products that contain nicotine or tobacco. These products include cigarettes, chewing tobacco, and vaping devices, such as e-cigarettes. If you need help quitting, ask your health care provider. ?Talk with your health care provider about any travel plans. It is important to make sure that you are still able to take your medicine while traveling. ?Keep all follow-up visits. This is important. ?Where to find more information ?American Lung Association: www.lung.org ?Centers for Disease Control and Prevention: http://www.wolf.info/ ?Contact a health care provider if: ?You missed a dose of your blood thinner medicine. ?You have a fever. ?Get help right away if: ?You have: ?New or increased pain, swelling, warmth, or redness in an arm or leg. ?Shortness of breath that gets worse during activity or at rest. ?Worsening chest pain. ?A rapid or irregular heartbeat. ?A severe headache. ?Vision changes. ?A serious fall or accident, or you hit your head. ?Blood in your vomit, stool, or urine. ?A cut that will not stop bleeding. ?You cough up blood. ?You feel light-headed or dizzy, and that feeling does not go away. ?You cannot move your arms or legs. ?You are confused or have memory loss. ?These symptoms may represent a serious problem that is an  emergency. Do not wait to see if the symptoms will go away. Get medical help right away. Call your local emergency services (911 in the [place] Do not drive yourself to the hospital. ?Summary ?A pulmonary embolism (PE) is a serious and potentially life-threatening condition. It happens when a blood clot from one part of the body travels to the arteries of the lung, causing a sudden blockage or decrease of blood flow to the lungs. This may result in shortness of breath, chest pain, dizziness, and fainting. ?Treatments for this condition usually include medicines to thin your blood (anticoagulants) or medicines to break apart blood clots. ?If you are given blood thinners, take your medicine exactly as told by your health care provider, at the same time every day. This is important. ?Understand what foods and drugs interact with any medicines that you are taking. ?If you have signs of PE or DVT, call your local emergency services (911 in the [place] ?This information is not intended to replace advice given to you by your health care provider. Make sure you discuss any questions you have with your health care provider. ?Document Revised: 05/22/2020 Document Reviewed: 05/22/2020 ?Elsevier Patient Education ? [place]. ? ?

## 2021-09-27 NOTE — Assessment & Plan Note (Signed)
Tolerating statin, encouraged heart healthy diet, avoid trans fats, minimize simple carbs and saturated fats. Increase exercise as tolerated 

## 2021-10-01 ENCOUNTER — Other Ambulatory Visit: Payer: Self-pay | Admitting: Family

## 2021-10-01 DIAGNOSIS — I2699 Other pulmonary embolism without acute cor pulmonale: Secondary | ICD-10-CM

## 2021-10-01 DIAGNOSIS — D6859 Other primary thrombophilia: Secondary | ICD-10-CM

## 2021-10-04 ENCOUNTER — Inpatient Hospital Stay: Payer: Medicare HMO | Attending: Family

## 2021-10-04 ENCOUNTER — Inpatient Hospital Stay: Payer: Medicare HMO | Admitting: Family

## 2021-10-11 ENCOUNTER — Other Ambulatory Visit: Payer: Self-pay | Admitting: Family Medicine

## 2021-10-11 ENCOUNTER — Inpatient Hospital Stay: Payer: Medicare HMO

## 2021-10-11 ENCOUNTER — Inpatient Hospital Stay: Payer: Medicare HMO | Admitting: Family

## 2021-10-25 ENCOUNTER — Other Ambulatory Visit: Payer: Self-pay | Admitting: Medical

## 2021-10-25 ENCOUNTER — Ambulatory Visit
Admission: RE | Admit: 2021-10-25 | Discharge: 2021-10-25 | Disposition: A | Discharge status: Home / Self Care | Payer: Medicare HMO | Source: Ambulatory Visit | Attending: Medical | Admitting: Medical

## 2021-10-25 DIAGNOSIS — N6489 Other specified disorders of breast: Secondary | ICD-10-CM | POA: Diagnosis not present

## 2021-10-25 DIAGNOSIS — N62 Hypertrophy of breast: Secondary | ICD-10-CM

## 2021-10-25 DIAGNOSIS — R928 Other abnormal and inconclusive findings on diagnostic imaging of breast: Secondary | ICD-10-CM | POA: Diagnosis not present

## 2021-10-25 DIAGNOSIS — N6310 Unspecified lump in the right breast, unspecified quadrant: Secondary | ICD-10-CM

## 2021-11-11 ENCOUNTER — Other Ambulatory Visit: Payer: Self-pay | Admitting: Family Medicine

## 2021-11-30 ENCOUNTER — Other Ambulatory Visit: Payer: Self-pay | Admitting: Family Medicine

## 2021-12-08 ENCOUNTER — Other Ambulatory Visit: Payer: Self-pay | Admitting: Family Medicine

## 2021-12-17 ENCOUNTER — Telehealth: Payer: Self-pay | Admitting: Pharmacist

## 2021-12-17 NOTE — Telephone Encounter (Signed)
Patient is taking Eliquis and Stiolto. Eliquis was started in December 2022. Per daughter, in past patient has reached coverage gap in November but anticipate that patient might reach Medicare coverage gap earlier this year since Eliquis has been added.  Daughter was not sure how close he was. Discussed monthly EOB that is mailed by [facility] and how to check to see how close he is to [facility] Coverage gap.  Mailed application for Eliquis and [employer]. Daughter will complete once reach coverage gap and return to office.

## 2022-01-17 DIAGNOSIS — Z87438 Personal history of other diseases of male genital organs: Secondary | ICD-10-CM | POA: Diagnosis not present

## 2022-01-17 DIAGNOSIS — Z8551 Personal history of malignant neoplasm of bladder: Secondary | ICD-10-CM | POA: Diagnosis not present

## 2022-01-25 ENCOUNTER — Other Ambulatory Visit: Payer: Medicare HMO

## 2022-02-02 ENCOUNTER — Other Ambulatory Visit: Payer: Self-pay | Admitting: Family Medicine

## 2022-02-08 ENCOUNTER — Telehealth: Payer: Self-pay | Admitting: Pharmacist

## 2022-02-08 NOTE — Telephone Encounter (Signed)
Patient appearing on report for [employer] [facility] Hypertension Control report. Clinical pharmacist made initial contact by phone 12/17/2021 with patient's daughter.  Most recent blood pressure was taken at visit with urologist. Blood pressure was 112/70 at urology appointment 01/17/2022.  Also called to check on medication costs. At last phone contact I had sent applications for Stiolto and Eliquis medication assistance program incase patient reaches Medicare coverage gap.   Attempted to contact patient's daughter. Unable to reach her. LM on VM with my contact number [phone]  Cherre Robins, PharmD Clinical Pharmacist [facility]

## 2022-02-10 NOTE — Telephone Encounter (Signed)
Patient's daughter called back to report that her mother is working with a patient assistance program that charges $45 per month. They have sent her applications to complete for Stiolto and Eliquis. Victor Cooper went with this option because she was concerned that her income would not qualify for medication assistance program.  I explained that I thought that this company was filling out the same applications that I had sent to them already.  Ok for them to continue with them if that is their preference but if for some reason Mr. Domingo is not approved for Stiolto or Eliquis, they should request a refund (I think these companies usually offer money back guarantee if patient is not approved for medication assistance program)  I also offered to assist if needed in future regarding medication costs.

## 2022-02-18 ENCOUNTER — Other Ambulatory Visit: Payer: Self-pay | Admitting: Family Medicine

## 2022-02-24 ENCOUNTER — Ambulatory Visit
Admission: RE | Admit: 2022-02-24 | Discharge: 2022-02-24 | Disposition: A | Discharge status: Home / Self Care | Payer: Medicare HMO | Source: Ambulatory Visit | Attending: Medical | Admitting: Medical

## 2022-02-24 DIAGNOSIS — N6310 Unspecified lump in the right breast, unspecified quadrant: Secondary | ICD-10-CM

## 2022-02-24 DIAGNOSIS — N6489 Other specified disorders of breast: Secondary | ICD-10-CM | POA: Diagnosis not present

## 2022-02-25 ENCOUNTER — Other Ambulatory Visit: Payer: Self-pay | Admitting: Family Medicine

## 2022-02-28 ENCOUNTER — Other Ambulatory Visit: Payer: Self-pay | Admitting: Family Medicine

## 2022-03-16 ENCOUNTER — Telehealth: Payer: Self-pay | Admitting: Pharmacist

## 2022-03-16 NOTE — Telephone Encounter (Signed)
Office received medication assistance program application in mail from Patient medication Victor Cooper.  Reviewed applications and completed provider portion. Will forward to PCP to review and sign.

## 2022-03-18 NOTE — Telephone Encounter (Signed)
Dr Charlett Blake signed Stiolto and Eliquis medication assistance program applications. Made copies and will scan into patient's chart. Mailed back original applications to Patient Medication Assistance at [place], [place].  Also notified patient's daughter that physician portion of application has been completed and mailed.

## 2022-04-04 NOTE — Assessment & Plan Note (Signed)
Well controlled, no changes to meds. Encouraged heart healthy diet such as the DASH diet and exercise as tolerated.  °

## 2022-04-04 NOTE — Assessment & Plan Note (Signed)
Encourage heart healthy diet such as MIND or DASH diet, increase exercise, avoid trans fats, simple carbohydrates and processed foods, consider a krill or fish or flaxseed oil cap daily.  Tolerating Rosuvastatin 

## 2022-04-04 NOTE — Assessment & Plan Note (Addendum)
Patient encouraged to maintain heart healthy diet, regular exercise, adequate sleep. Consider daily probiotics. Take medications as prescribed. Labs ordered and reviewed RSV (respiratory syncitial virus) vaccine at pharmacy, Arexvy Covid booster new version at pharmacy,  High dose flu shot, given today Aged out of screening colonoscopy

## 2022-04-04 NOTE — Assessment & Plan Note (Signed)
Following with urology

## 2022-04-04 NOTE — Assessment & Plan Note (Signed)
hgba1c acceptable, minimize simple carbs. Increase exercise as tolerated.  

## 2022-04-05 ENCOUNTER — Encounter: Payer: Self-pay | Admitting: Family Medicine

## 2022-04-05 ENCOUNTER — Ambulatory Visit (INDEPENDENT_AMBULATORY_CARE_PROVIDER_SITE_OTHER): Payer: Medicare HMO | Admitting: Family Medicine

## 2022-04-05 VITALS — BP 132/80 | HR 78 | Temp 98.0°F | Resp 16 | Ht 70.0 in | Wt 226.2 lb

## 2022-04-05 DIAGNOSIS — E7800 Pure hypercholesterolemia, unspecified: Secondary | ICD-10-CM

## 2022-04-05 DIAGNOSIS — I10 Essential (primary) hypertension: Secondary | ICD-10-CM | POA: Diagnosis not present

## 2022-04-05 DIAGNOSIS — R0609 Other forms of dyspnea: Secondary | ICD-10-CM

## 2022-04-05 DIAGNOSIS — R011 Cardiac murmur, unspecified: Secondary | ICD-10-CM

## 2022-04-05 DIAGNOSIS — R739 Hyperglycemia, unspecified: Secondary | ICD-10-CM

## 2022-04-05 DIAGNOSIS — Z23 Encounter for immunization: Secondary | ICD-10-CM | POA: Diagnosis not present

## 2022-04-05 DIAGNOSIS — C679 Malignant neoplasm of bladder, unspecified: Secondary | ICD-10-CM

## 2022-04-05 DIAGNOSIS — Z0000 Encounter for general adult medical examination without abnormal findings: Secondary | ICD-10-CM | POA: Diagnosis not present
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
## 2022-04-05 NOTE — Assessment & Plan Note (Addendum)
Only occurs when he carries a case of bottled water up 14 steps. He is encouraged to stay active and report if symptoms worsen. Was seen by cardiology in past and was due for annual follow up in September 2023 but still needs appt so new referral is placed

## 2022-04-05 NOTE — Patient Instructions (Signed)
RSV (respiratory syncitial virus) vaccine at pharmacy, Arexvy Covid boost at pharmacy High dose flu shot given today   Preventive Care 65 Years and Older, Male Preventive care refers to lifestyle choices and visits with your health care provider that can promote health and wellness. Preventive care visits are also called wellness exams. What can I expect for my preventive care visit? Counseling During your preventive care visit, your health care provider may ask about your: Medical history, including: Past medical problems. Family medical history. History of falls. Current health, including: Emotional well-being. Home life and relationship well-being. Sexual activity. Memory and ability to understand (cognition). Lifestyle, including: Alcohol, nicotine or tobacco, and drug use. Access to firearms. Diet, exercise, and sleep habits. Work and work Statistician. Sunscreen use. Safety issues such as seatbelt and bike helmet use. Physical exam Your health care provider will check your: Height and weight. These may be used to calculate your BMI (body mass index). BMI is a measurement that tells if you are at a healthy weight. Waist circumference. This measures the distance around your waistline. This measurement also tells if you are at a healthy weight and may help predict your risk of certain diseases, such as type 2 diabetes and high blood pressure. Heart rate and blood pressure. Body temperature. Skin for abnormal spots. What immunizations do I need?  Vaccines are usually given at various ages, according to a schedule. Your health care provider will recommend vaccines for you based on your age, medical history, and lifestyle or other factors, such as travel or where you work. What tests do I need? Screening Your health care provider may recommend screening tests for certain conditions. This may include: Lipid and cholesterol levels. Diabetes screening. This is done by checking your  blood sugar (glucose) after you have not eaten for a while (fasting). Hepatitis C test. Hepatitis B test. HIV (human immunodeficiency virus) test. STI (sexually transmitted infection) testing, if you are at risk. Lung cancer screening. Colorectal cancer screening. Prostate cancer screening. Abdominal aortic aneurysm (AAA) screening. You may need this if you are a current or former smoker. Talk with your health care provider about your test results, treatment options, and if necessary, the need for more tests. Follow these instructions at home: Eating and drinking  Eat a diet that includes fresh fruits and vegetables, whole grains, lean protein, and low-fat dairy products. Limit your intake of foods with high amounts of sugar, saturated fats, and salt. Take vitamin and mineral supplements as recommended by your health care provider. Do not drink alcohol if your health care provider tells you not to drink. If you drink alcohol: Limit how much you have to 0-2 drinks a day. Know how much alcohol is in your drink. In the [place] one drink equals one 12 oz bottle of beer (355 mL), one 5 oz glass of wine (148 mL), or one 1 oz glass of hard liquor (44 mL). Lifestyle Brush your teeth every morning and night with fluoride toothpaste. Floss one time each day. Exercise for at least 30 minutes 5 or more days each week. Do not use any products that contain nicotine or tobacco. These products include cigarettes, chewing tobacco, and vaping devices, such as e-cigarettes. If you need help quitting, ask your health care provider. Do not use drugs. If you are sexually active, practice safe sex. Use a condom or other form of protection to prevent STIs. Take aspirin only as told by your health care provider. Make sure that you understand how much  to take and what form to take. Work with your health care provider to find out whether it is safe and beneficial for you to take aspirin daily. Ask your health care  provider if you need to take a cholesterol-lowering medicine (statin). Find healthy ways to manage stress, such as: Meditation, yoga, or listening to music. Journaling. Talking to a trusted person. Spending time with friends and family. Safety Always wear your seat belt while driving or riding in a vehicle. Do not drive: If you have been drinking alcohol. Do not ride with someone who has been drinking. When you are tired or distracted. While texting. If you have been using any mind-altering substances or drugs. Wear a helmet and other protective equipment during sports activities. If you have firearms in your house, make sure you follow all gun safety procedures. Minimize exposure to UV radiation to reduce your risk of skin cancer. What's next? Visit your health care provider once a year for an annual wellness visit. Ask your health care provider how often you should have your eyes and teeth checked. Stay up to date on all vaccines. This information is not intended to replace advice given to you by your health care provider. Make sure you discuss any questions you have with your health care provider. Document Revised: 12/16/2020 Document Reviewed: 12/16/2020 Elsevier Patient Education  [place].

## 2022-04-06 LAB — COMPREHENSIVE METABOLIC PANEL
ALT: 43 U/L (ref 0–53)
AST: 42 U/L — ABNORMAL HIGH (ref 0–37)
Albumin: 4.3 g/dL (ref 3.5–5.2)
Alkaline Phosphatase: 56 U/L (ref 39–117)
BUN: 18 mg/dL (ref 6–23)
CO2: 26 mEq/L (ref 19–32)
Calcium: 9.8 mg/dL (ref 8.4–10.5)
Chloride: 101 mEq/L (ref 96–112)
Creatinine, Ser: 1.38 mg/dL (ref 0.40–1.50)
GFR: 48.66 mL/min — ABNORMAL LOW (ref >60.00)
Glucose, Bld: 93 mg/dL (ref 70–99)
Potassium: 4.3 mEq/L (ref 3.5–5.1)
Sodium: 137 mEq/L (ref 135–145)
Total Bilirubin: 0.8 mg/dL (ref 0.2–1.2)
Total Protein: 7.3 g/dL (ref 6.0–8.3)

## 2022-04-06 LAB — CBC WITH DIFFERENTIAL/PLATELET
Basophils Absolute: 0.1 10*3/uL (ref 0.0–0.1)
Basophils Relative: 1.1 % (ref 0.0–3.0)
Eosinophils Absolute: 0.2 10*3/uL (ref 0.0–0.7)
Eosinophils Relative: 2.4 % (ref 0.0–5.0)
HCT: 44.1 % (ref 39.0–52.0)
Hemoglobin: 14.8 g/dL (ref 13.0–17.0)
Lymphocytes Relative: 28.2 % (ref 12.0–46.0)
Lymphs Abs: 2 10*3/uL (ref 0.7–4.0)
MCHC: 33.4 g/dL (ref 30.0–36.0)
MCV: 89.1 fl (ref 78.0–100.0)
Monocytes Absolute: 0.7 10*3/uL (ref 0.1–1.0)
Monocytes Relative: 10.1 % (ref 3.0–12.0)
Neutro Abs: 4.1 10*3/uL (ref 1.4–7.7)
Neutrophils Relative %: 58.2 % (ref 43.0–77.0)
Platelets: 205 10*3/uL (ref 150.0–400.0)
RBC: 4.95 Mil/uL (ref 4.22–5.81)
RDW: 14.3 % (ref 11.5–15.5)
WBC: 7.1 10*3/uL (ref 4.0–10.5)

## 2022-04-06 LAB — HEMOGLOBIN A1C: Hgb A1c MFr Bld: 7.1 % — ABNORMAL HIGH (ref 4.6–6.5)

## 2022-04-07 ENCOUNTER — Other Ambulatory Visit: Payer: Self-pay

## 2022-04-07 DIAGNOSIS — E7800 Pure hypercholesterolemia, unspecified: Secondary | ICD-10-CM

## 2022-04-07 DIAGNOSIS — I10 Essential (primary) hypertension: Secondary | ICD-10-CM

## 2022-04-07 DIAGNOSIS — R739 Hyperglycemia, unspecified: Secondary | ICD-10-CM

## 2022-04-07 MED ORDER — METFORMIN HCL 500 MG PO TABS: ORAL_TABLET | ORAL | 2 refills | Status: DC

## 2022-04-07 MED ORDER — BLOOD GLUCOSE MONITOR KIT: PACK | 5 refills | Status: DC

## 2022-04-22 ENCOUNTER — Encounter: Payer: Self-pay | Admitting: Nurse Practitioner

## 2022-04-22 ENCOUNTER — Telehealth: Payer: Self-pay

## 2022-04-22 ENCOUNTER — Ambulatory Visit (INDEPENDENT_AMBULATORY_CARE_PROVIDER_SITE_OTHER): Payer: Medicare HMO | Admitting: Nurse Practitioner

## 2022-04-22 VITALS — BP 138/73 | HR 66 | Wt 220.4 lb

## 2022-04-22 DIAGNOSIS — R051 Acute cough: Secondary | ICD-10-CM | POA: Diagnosis not present

## 2022-04-22 LAB — POC COVID19 BINAXNOW: SARS Coronavirus 2 Ag: NEGATIVE

## 2022-04-22 MED ORDER — GUAIFENESIN-DM 100-10 MG/5ML PO SYRP: 5.0000 mL | ORAL_SOLUTION | ORAL | 0 refills | Status: DC | PRN

## 2022-04-22 MED ORDER — DOXYCYCLINE HYCLATE 100 MG PO TABS: 100.0000 mg | ORAL_TABLET | Freq: Two times a day (BID) | ORAL | 0 refills | Status: DC

## 2022-04-22 NOTE — Telephone Encounter (Signed)
Pt called been seen at another office  Has appt with [place] 04/25/22

## 2022-04-22 NOTE — Telephone Encounter (Signed)
Caller Name [place] Phone Number [phone] Patient Name Victor Cooper Patient DOB Apr 02, 1941 Call Type Message Only Information Provided Reason for Call Request to Schedule Office Appointment Initial Comment Caller states that her dad is not feeling well. It started with a cough yesterday , and today she states that he has no energy, the cough continues, and his back is hurting because of the amount of him coughing. He has taken Mucinex, and Nyquil cough. Caller states that her dads doctor is Charlett Blake, Stacey-MD. Patient request to speak to RN No Disp. Time Disposition Final User 04/22/2022 8:04:44 AM General Information Provided Yes Minna Merritts Call Closed By: Minna Merritts Transaction Date/Time: 04/22/2022 8:00:09 AM (ET)

## 2022-04-22 NOTE — Patient Instructions (Signed)
It was great to see you!  Start doxycycline 1 tablet twice a day with food. You can also take the cough syrup every 4 hours as needed for cough. Drink plenty of fluids.   Let's follow-up if your symptoms worsen or don't improve.   Take care,  Vance Peper, NP

## 2022-04-22 NOTE — Progress Notes (Signed)
   Acute Office Visit  Subjective:     Patient ID: Victor Cooper, male    DOB: 02/01/1941, 81 y.o.   MRN: [id number]  Chief Complaint  Patient presents with  . Cough    Pt c/o persistent productive cough x3 days. Pt has hx of chronic COPD and denies other URI symptoms nasal congestion/body aches/headaches/fever/SOB/chest pain or tightness.     HPI Patient is in today for ***  Mucinex   ROS      Objective:    BP 138/73   Pulse 66   Wt 220 lb 6.4 oz (100 kg)   SpO2 99%   BMI 31.62 kg/m     Physical Exam  No results found for any visits on 04/22/22.      Assessment & Plan:   Problem List Items Addressed This Visit   None Visit Diagnoses     Acute cough    -  Primary   Relevant Orders   POC COVID-19 BinaxNow       No orders of the defined types were placed in this encounter.   No follow-ups on file.  Charyl Dancer, NP

## 2022-04-23 DIAGNOSIS — R051 Acute cough: Secondary | ICD-10-CM | POA: Insufficient documentation

## 2022-04-23 NOTE — Assessment & Plan Note (Addendum)
Acute cough for the past few days, not controlled with Mucinex.  COVID test in office negative.  With history of COPD and productive cough, we will start doxycycline 100 mg BID x 10 days.  We will also provide him with Robitussin-DM cough medicine.  Lungs are clear, no wheezing, will hold off on prednisone at this time. Encourage fluids, rest.  Follow-up if symptoms worsen or do not improve.

## 2022-04-25 ENCOUNTER — Other Ambulatory Visit (INDEPENDENT_AMBULATORY_CARE_PROVIDER_SITE_OTHER): Payer: Medicare HMO

## 2022-04-25 DIAGNOSIS — I10 Essential (primary) hypertension: Secondary | ICD-10-CM | POA: Diagnosis not present

## 2022-04-25 DIAGNOSIS — E7800 Pure hypercholesterolemia, unspecified: Secondary | ICD-10-CM

## 2022-04-25 LAB — COMPREHENSIVE METABOLIC PANEL
ALT: 29 U/L (ref 0–53)
AST: 33 U/L (ref 0–37)
Albumin: 4.2 g/dL (ref 3.5–5.2)
Alkaline Phosphatase: 52 U/L (ref 39–117)
BUN: 21 mg/dL (ref 6–23)
CO2: 25 mEq/L (ref 19–32)
Calcium: 9.8 mg/dL (ref 8.4–10.5)
Chloride: 99 mEq/L (ref 96–112)
Creatinine, Ser: 1.32 mg/dL (ref 0.40–1.50)
GFR: 51.31 mL/min — ABNORMAL LOW (ref >60.00)
Glucose, Bld: 89 mg/dL (ref 70–99)
Potassium: 4.3 mEq/L (ref 3.5–5.1)
Sodium: 134 mEq/L — ABNORMAL LOW (ref 135–145)
Total Bilirubin: 0.7 mg/dL (ref 0.2–1.2)
Total Protein: 7.3 g/dL (ref 6.0–8.3)

## 2022-04-29 ENCOUNTER — Ambulatory Visit: Payer: Medicare HMO | Admitting: Cardiology

## 2022-05-02 ENCOUNTER — Telehealth: Payer: Self-pay | Admitting: Family Medicine

## 2022-05-02 NOTE — Telephone Encounter (Signed)
Forms faxed into front office Placed in blyth bin up front

## 2022-05-03 NOTE — Telephone Encounter (Signed)
Got the paperwork from bin and have it for review  To be completed.

## 2022-05-18 NOTE — Telephone Encounter (Signed)
Paperwork was sent

## 2022-05-18 NOTE — Telephone Encounter (Signed)
Patient's daughter called to check on FMLA paperwork. She stated the due date is soon and they needs the forms faxed back. Please advise.

## 2022-05-25 ENCOUNTER — Other Ambulatory Visit: Payer: Self-pay | Admitting: Family Medicine

## 2022-05-31 ENCOUNTER — Ambulatory Visit: Payer: Medicare HMO | Attending: Cardiology | Admitting: Cardiology

## 2022-05-31 ENCOUNTER — Encounter: Payer: Self-pay | Admitting: Cardiology

## 2022-05-31 VITALS — BP 132/78 | HR 58 | Ht 70.0 in | Wt 219.0 lb

## 2022-05-31 DIAGNOSIS — R0609 Other forms of dyspnea: Secondary | ICD-10-CM | POA: Diagnosis not present

## 2022-05-31 DIAGNOSIS — Z0000 Encounter for general adult medical examination without abnormal findings: Secondary | ICD-10-CM | POA: Diagnosis not present

## 2022-05-31 DIAGNOSIS — E669 Obesity, unspecified: Secondary | ICD-10-CM

## 2022-05-31 DIAGNOSIS — I10 Essential (primary) hypertension: Secondary | ICD-10-CM

## 2022-05-31 NOTE — Patient Instructions (Signed)
Medication Instructions:  STOP Aspirin 81  *If you need a refill on your cardiac medications before your next appointment, please call your pharmacy*  Lab Work: NONE ordered at this time of appointment   If you have labs (blood work) drawn today and your tests are completely normal, you will receive your results only by: [place] (if you have MyChart) OR A paper copy in the mail If you have any lab test that is abnormal or we need to change your treatment, we will call you to review the results.  Testing/Procedures: NONE ordered at this time of appointment   Follow-Up: At [facility], you and your health needs are our priority.  As part of our continuing mission to provide you with exceptional heart care, we have created designated Provider Care Teams.  These Care Teams include your primary Cardiologist (physician) and Advanced Practice Providers (APPs -  Physician Assistants and Nurse Practitioners) who all work together to provide you with the care you need, when you need it.  Your next appointment:   1 year(s)  The format for your next appointment:   In Person  Provider:   Berniece Salines, DO     Other Instructions  Important Information About Sugar
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
## 2022-06-09 ENCOUNTER — Other Ambulatory Visit: Payer: Self-pay | Admitting: Family Medicine

## 2022-07-06 ENCOUNTER — Other Ambulatory Visit: Payer: Self-pay | Admitting: Family Medicine

## 2022-07-06 DIAGNOSIS — R739 Hyperglycemia, unspecified: Secondary | ICD-10-CM

## 2022-07-27 ENCOUNTER — Telehealth: Payer: Self-pay | Admitting: Family Medicine

## 2022-07-27 ENCOUNTER — Other Ambulatory Visit: Payer: Self-pay

## 2022-07-27 DIAGNOSIS — R739 Hyperglycemia, unspecified: Secondary | ICD-10-CM

## 2022-07-27 MED ORDER — APIXABAN 5 MG PO TABS: 5.0000 mg | ORAL_TABLET | Freq: Two times a day (BID) | ORAL | 0 refills | Status: DC

## 2022-07-27 MED ORDER — METFORMIN HCL 500 MG PO TABS: ORAL_TABLET | ORAL | 0 refills | Status: DC

## 2022-07-27 NOTE — Telephone Encounter (Signed)
Medication sent.

## 2022-07-27 NOTE — Telephone Encounter (Signed)
Medication: ELIQUIS 5 MG TABS tablet   metFORMIN (GLUCOPHAGE) 500 MG tablet   Has the patient contacted their pharmacy? Yes.     Preferred Pharmacy:   [place], [place] - [address], [place] [address] Phone: [phone]  Fax: [phone]

## 2022-07-28 ENCOUNTER — Telehealth: Payer: Self-pay | Admitting: Family Medicine

## 2022-07-28 ENCOUNTER — Ambulatory Visit: Payer: Medicare HMO | Admitting: Family Medicine

## 2022-07-28 ENCOUNTER — Other Ambulatory Visit: Payer: Self-pay

## 2022-07-28 DIAGNOSIS — R739 Hyperglycemia, unspecified: Secondary | ICD-10-CM

## 2022-07-28 MED ORDER — METFORMIN HCL 500 MG PO TABS: ORAL_TABLET | ORAL | 0 refills | Status: DC

## 2022-07-28 NOTE — Telephone Encounter (Signed)
Pt came in office since had appt today with provider, but provider not in office today - pt states needing refill on olmesartan-hydrochlorothiazide (BENICAR HCT) 40-25 MG tablet sent to [place], [place] - [address], [place] [address] Phone: [phone]  Fax: [phone]. Please advise.

## 2022-07-28 NOTE — Telephone Encounter (Signed)
Medication was sent

## 2022-07-28 NOTE — Telephone Encounter (Signed)
Copied from [place] [id number]. Topic: Medicare AWV >> Jul 28, 2022 11:21 AM Devoria Glassing wrote: Reason for CRM: LVM 07/28/22 to r/s AWV on 07/11/22. New appt date 08/12/2022 at 10:20am. Please confirm new AWV date khc

## 2022-08-08 ENCOUNTER — Ambulatory Visit (INDEPENDENT_AMBULATORY_CARE_PROVIDER_SITE_OTHER): Payer: Medicare HMO | Admitting: Family

## 2022-08-08 ENCOUNTER — Encounter: Payer: Self-pay | Admitting: Family

## 2022-08-08 VITALS — BP 120/57 | HR 59 | Temp 97.4°F | Resp 16 | Wt 218.0 lb

## 2022-08-08 DIAGNOSIS — E119 Type 2 diabetes mellitus without complications: Secondary | ICD-10-CM

## 2022-08-08 DIAGNOSIS — Z8616 Personal history of COVID-19: Secondary | ICD-10-CM

## 2022-08-08 DIAGNOSIS — E7800 Pure hypercholesterolemia, unspecified: Secondary | ICD-10-CM

## 2022-08-08 DIAGNOSIS — I10 Essential (primary) hypertension: Secondary | ICD-10-CM

## 2022-08-08 DIAGNOSIS — J449 Chronic obstructive pulmonary disease, unspecified: Secondary | ICD-10-CM

## 2022-08-08 DIAGNOSIS — Z1159 Encounter for screening for other viral diseases: Secondary | ICD-10-CM | POA: Diagnosis not present

## 2022-08-08 DIAGNOSIS — I2699 Other pulmonary embolism without acute cor pulmonale: Secondary | ICD-10-CM

## 2022-08-08 DIAGNOSIS — R739 Hyperglycemia, unspecified: Secondary | ICD-10-CM

## 2022-08-08 DIAGNOSIS — Z8739 Personal history of other diseases of the musculoskeletal system and connective tissue: Secondary | ICD-10-CM | POA: Diagnosis not present

## 2022-08-08 LAB — LIPID PANEL
Cholesterol: 136 mg/dL (ref 0–200)
HDL: 56.6 mg/dL (ref >39.00)
LDL Cholesterol: 63 mg/dL (ref 0–99)
NonHDL: 79.13
Total CHOL/HDL Ratio: 2
Triglycerides: 81 mg/dL (ref 0.0–149.0)
VLDL: 16.2 mg/dL (ref 0.0–40.0)

## 2022-08-08 LAB — MICROALBUMIN / CREATININE URINE RATIO
Creatinine,U: 44 mg/dL
Microalb Creat Ratio: 1.6 mg/g (ref 0.0–30.0)
Microalb, Ur: <0.7 mg/dL (ref 0.0–1.9)

## 2022-08-08 LAB — COMPREHENSIVE METABOLIC PANEL
ALT: 34 U/L (ref 0–53)
AST: 31 U/L (ref 0–37)
Albumin: 4.4 g/dL (ref 3.5–5.2)
Alkaline Phosphatase: 62 U/L (ref 39–117)
BUN: 12 mg/dL (ref 6–23)
CO2: 24 mEq/L (ref 19–32)
Calcium: 9.7 mg/dL (ref 8.4–10.5)
Chloride: 100 mEq/L (ref 96–112)
Creatinine, Ser: 1.21 mg/dL (ref 0.40–1.50)
GFR: 56.84 mL/min — ABNORMAL LOW (ref >60.00)
Glucose, Bld: 99 mg/dL (ref 70–99)
Potassium: 4 mEq/L (ref 3.5–5.1)
Sodium: 134 mEq/L — ABNORMAL LOW (ref 135–145)
Total Bilirubin: 1.2 mg/dL (ref 0.2–1.2)
Total Protein: 7.3 g/dL (ref 6.0–8.3)

## 2022-08-08 LAB — HEMOGLOBIN A1C: Hgb A1c MFr Bld: 6.5 % (ref 4.6–6.5)

## 2022-08-08 MED ORDER — ALBUTEROL SULFATE HFA 108 (90 BASE) MCG/ACT IN AERS: 2.0000 | INHALATION_SPRAY | Freq: Four times a day (QID) | RESPIRATORY_TRACT | 5 refills | Status: AC

## 2022-08-08 MED ORDER — OLMESARTAN MEDOXOMIL-HCTZ 40-25 MG PO TABS: 1.0000 | ORAL_TABLET | Freq: Every day | ORAL | 1 refills | Status: DC

## 2022-08-08 MED ORDER — STIOLTO RESPIMAT 2.5-2.5 MCG/ACT IN AERS: INHALATION_SPRAY | RESPIRATORY_TRACT | 5 refills | Status: DC

## 2022-08-08 MED ORDER — ROSUVASTATIN CALCIUM 20 MG PO TABS: 20.0000 mg | ORAL_TABLET | Freq: Every day | ORAL | 1 refills | Status: DC

## 2022-08-08 MED ORDER — AMLODIPINE BESYLATE 10 MG PO TABS: 10.0000 mg | ORAL_TABLET | Freq: Every day | ORAL | 1 refills | Status: AC

## 2022-08-08 MED ORDER — METFORMIN HCL 500 MG PO TABS: ORAL_TABLET | ORAL | 1 refills | Status: DC

## 2022-08-08 MED ORDER — APIXABAN 5 MG PO TABS: 5.0000 mg | ORAL_TABLET | Freq: Two times a day (BID) | ORAL | 1 refills | Status: DC

## 2022-08-08 NOTE — Assessment & Plan Note (Signed)
Reports symptoms are stable on Stioloto and prn albuterol.

## 2022-08-08 NOTE — Assessment & Plan Note (Signed)
BP Readings from Last 3 Encounters:  08/08/22 (!) 120/57  05/31/22 132/78  04/22/22 138/73   No complaints- continues amlodipine '10mg'$  and benicar hct 40-'25mg'$ .
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
## 2022-08-08 NOTE — Assessment & Plan Note (Signed)
No recent gout symptoms.

## 2022-08-09 LAB — HEPATITIS C ANTIBODY: Hepatitis C Ab: NONREACTIVE

## 2022-08-10 NOTE — Assessment & Plan Note (Signed)
Maintained on crestor. Obtain follow up lipid panel.

## 2022-08-10 NOTE — Assessment & Plan Note (Signed)
No recent gout symptoms.

## 2022-08-10 NOTE — Assessment & Plan Note (Signed)
Pt continues Eliquis. It looks like a referral to hematology has been made several times but he has not followed through with this.  Will re-initiate referral. Encouraged pt to keep appointment.

## 2022-08-11 ENCOUNTER — Ambulatory Visit: Payer: Medicare HMO

## 2022-08-12 ENCOUNTER — Ambulatory Visit (INDEPENDENT_AMBULATORY_CARE_PROVIDER_SITE_OTHER): Payer: Medicare HMO | Admitting: *Deleted

## 2022-08-12 DIAGNOSIS — Z0000 Encounter for general adult medical examination without abnormal findings: Secondary | ICD-10-CM
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
## 2022-08-12 NOTE — Patient Instructions (Signed)
[name] , Thank you for taking time to come for your Medicare Wellness Visit. I appreciate your ongoing commitment to your health goals. Please review the following plan we discussed and let me know if I can assist you in the future.   These are the goals we discussed:  Goals      Patient Stated     Maintain current health        This is a list of the screening recommended for you and due dates:  Health Maintenance  Topic Date Due   Yearly kidney function blood test for diabetes  08/09/2023   Yearly kidney health urinalysis for diabetes  08/09/2023   Medicare Annual Wellness Visit  08/13/2023   DTaP/Tdap/Td vaccine (2 - Td or Tdap) 08/29/2031   Pneumonia Vaccine  Completed   Flu Shot  Completed   Hepatitis C Screening: USPSTF Recommendation to screen - Ages 18-79 yo.  Completed   Zoster (Shingles) Vaccine  Completed   HPV Vaccine  Aged Out   COVID-19 Vaccine  Discontinued     Next appointment: Follow up in one year for your annual wellness visit.   Preventive Care 46 Years and Older, Male Preventive care refers to lifestyle choices and visits with your health care provider that can promote health and wellness. What does preventive care include? A yearly physical exam. This is also called an annual well check. Dental exams once or twice a year. Routine eye exams. Ask your health care provider how often you should have your eyes checked. Personal lifestyle choices, including: Daily care of your teeth and gums. Regular physical activity. Eating a healthy diet. Avoiding tobacco and drug use. Limiting alcohol use. Practicing safe sex. Taking low doses of aspirin every day. Taking vitamin and mineral supplements as recommended by your health care provider. What happens during an annual well check? The services and screenings done by your health care provider during your annual well check will depend on your age, overall health, lifestyle risk factors, and family history of  disease. Counseling  Your health care provider may ask you questions about your: Alcohol use. Tobacco use. Drug use. Emotional well-being. Home and relationship well-being. Sexual activity. Eating habits. History of falls. Memory and ability to understand (cognition). Work and work Statistician. Screening  You may have the following tests or measurements: Height, weight, and BMI. Blood pressure. Lipid and cholesterol levels. These may be checked every 5 years, or more frequently if you are over 76 years old. Skin check. Lung cancer screening. You may have this screening every year starting at age 30 if you have a 30-pack-year history of smoking and currently smoke or have quit within the past 15 years. Fecal occult blood test (FOBT) of the stool. You may have this test every year starting at age 77. Flexible sigmoidoscopy or colonoscopy. You may have a sigmoidoscopy every 5 years or a colonoscopy every 10 years starting at age 43. Prostate cancer screening. Recommendations will vary depending on your family history and other risks. Hepatitis C blood test. Hepatitis B blood test. Sexually transmitted disease (STD) testing. Diabetes screening. This is done by checking your blood sugar (glucose) after you have not eaten for a while (fasting). You may have this done every 1-3 years. Abdominal aortic aneurysm (AAA) screening. You may need this if you are a current or former smoker. Osteoporosis. You may be screened starting at age 5 if you are at high risk. Talk with your health care provider about your test  results, treatment options, and if necessary, the need for more tests. Vaccines  Your health care provider may recommend certain vaccines, such as: Influenza vaccine. This is recommended every year. Tetanus, diphtheria, and acellular pertussis (Tdap, Td) vaccine. You may need a Td booster every 10 years. Zoster vaccine. You may need this after age 69. Pneumococcal 13-valent  conjugate (PCV13) vaccine. One dose is recommended after age 35. Pneumococcal polysaccharide (PPSV23) vaccine. One dose is recommended after age 70. Talk to your health care provider about which screenings and vaccines you need and how often you need them. This information is not intended to replace advice given to you by your health care provider. Make sure you discuss any questions you have with your health care provider. Document Released: 07/17/2015 Document Revised: 03/09/2016 Document Reviewed: 04/21/2015 Elsevier Interactive Patient Education  2017 [place] Prevention in the Home Falls can cause injuries. They can happen to people of all ages. There are many things you can do to make your home safe and to help prevent falls. What can I do on the outside of my home? Regularly fix the edges of walkways and driveways and fix any cracks. Remove anything that might make you trip as you walk through a door, such as a raised step or threshold. Trim any bushes or trees on the path to your home. Use bright outdoor lighting. Clear any walking paths of anything that might make someone trip, such as rocks or tools. Regularly check to see if handrails are loose or broken. Make sure that both sides of any steps have handrails. Any raised decks and porches should have guardrails on the edges. Have any leaves, snow, or ice cleared regularly. Use sand or salt on walking paths during winter. Clean up any spills in your garage right away. This includes oil or grease spills. What can I do in the bathroom? Use night lights. Install grab bars by the toilet and in the tub and shower. Do not use towel bars as grab bars. Use non-skid mats or decals in the tub or shower. If you need to sit down in the shower, use a plastic, non-slip stool. Keep the floor dry. Clean up any water that spills on the floor as soon as it happens. Remove soap buildup in the tub or shower regularly. Attach bath mats  securely with double-sided non-slip rug tape. Do not have throw rugs and other things on the floor that can make you trip. What can I do in the bedroom? Use night lights. Make sure that you have a light by your bed that is easy to reach. Do not use any sheets or blankets that are too big for your bed. They should not hang down onto the floor. Have a firm chair that has side arms. You can use this for support while you get dressed. Do not have throw rugs and other things on the floor that can make you trip. What can I do in the kitchen? Clean up any spills right away. Avoid walking on wet floors. Keep items that you use a lot in easy-to-reach places. If you need to reach something above you, use a strong step stool that has a grab bar. Keep electrical cords out of the way. Do not use floor polish or wax that makes floors slippery. If you must use wax, use non-skid floor wax. Do not have throw rugs and other things on the floor that can make you trip. What can I do with my stairs?  Do not leave any items on the stairs. Make sure that there are handrails on both sides of the stairs and use them. Fix handrails that are broken or loose. Make sure that handrails are as long as the stairways. Check any carpeting to make sure that it is firmly attached to the stairs. Fix any carpet that is loose or worn. Avoid having throw rugs at the top or bottom of the stairs. If you do have throw rugs, attach them to the floor with carpet tape. Make sure that you have a light switch at the top of the stairs and the bottom of the stairs. If you do not have them, ask someone to add them for you. What else can I do to help prevent falls? Wear shoes that: Do not have high heels. Have rubber bottoms. Are comfortable and fit you well. Are closed at the toe. Do not wear sandals. If you use a stepladder: Make sure that it is fully opened. Do not climb a closed stepladder. Make sure that both sides of the stepladder  are locked into place. Ask someone to hold it for you, if possible. Clearly mark and make sure that you can see: Any grab bars or handrails. First and last steps. Where the edge of each step is. Use tools that help you move around (mobility aids) if they are needed. These include: Canes. Walkers. Scooters. Crutches. Turn on the lights when you go into a dark area. Replace any light bulbs as soon as they burn out. Set up your furniture so you have a clear path. Avoid moving your furniture around. If any of your floors are uneven, fix them. If there are any pets around you, be aware of where they are. Review your medicines with your doctor. Some medicines can make you feel dizzy. This can increase your chance of falling. Ask your doctor what other things that you can do to help prevent falls. This information is not intended to replace advice given to you by your health care provider. Make sure you discuss any questions you have with your health care provider. Document Released: 04/16/2009 Document Revised: 11/26/2015 Document Reviewed: 07/25/2014 Elsevier Interactive Patient Education  2017 [employer].

## 2022-08-12 NOTE — Progress Notes (Signed)
I have reviewed and agree with [facility] documentation.  Kathlene November, MD

## 2022-08-16 ENCOUNTER — Inpatient Hospital Stay: Payer: Medicare HMO | Attending: Hematology & Oncology

## 2022-08-16 ENCOUNTER — Inpatient Hospital Stay (HOSPITAL_BASED_OUTPATIENT_CLINIC_OR_DEPARTMENT_OTHER): Payer: Medicare HMO | Admitting: Family

## 2022-08-16 ENCOUNTER — Encounter: Payer: Self-pay | Admitting: Family

## 2022-08-16 VITALS — BP 135/64 | HR 61 | Temp 99.0°F | Resp 18 | Ht 70.5 in | Wt 219.4 lb

## 2022-08-16 DIAGNOSIS — Z86718 Personal history of other venous thrombosis and embolism: Secondary | ICD-10-CM

## 2022-08-16 DIAGNOSIS — Z7901 Long term (current) use of anticoagulants: Secondary | ICD-10-CM

## 2022-08-16 DIAGNOSIS — Z79899 Other long term (current) drug therapy: Secondary | ICD-10-CM | POA: Diagnosis not present

## 2022-08-16 DIAGNOSIS — M069 Rheumatoid arthritis, unspecified: Secondary | ICD-10-CM | POA: Insufficient documentation

## 2022-08-16 DIAGNOSIS — E7800 Pure hypercholesterolemia, unspecified: Secondary | ICD-10-CM | POA: Insufficient documentation

## 2022-08-16 DIAGNOSIS — D6859 Other primary thrombophilia: Secondary | ICD-10-CM

## 2022-08-16 DIAGNOSIS — Z7984 Long term (current) use of oral hypoglycemic drugs: Secondary | ICD-10-CM

## 2022-08-16 DIAGNOSIS — J449 Chronic obstructive pulmonary disease, unspecified: Secondary | ICD-10-CM | POA: Insufficient documentation

## 2022-08-16 DIAGNOSIS — E119 Type 2 diabetes mellitus without complications: Secondary | ICD-10-CM | POA: Insufficient documentation

## 2022-08-16 DIAGNOSIS — Z8551 Personal history of malignant neoplasm of bladder: Secondary | ICD-10-CM | POA: Diagnosis not present

## 2022-08-16 DIAGNOSIS — Z86711 Personal history of pulmonary embolism: Secondary | ICD-10-CM | POA: Insufficient documentation

## 2022-08-16 DIAGNOSIS — I2699 Other pulmonary embolism without acute cor pulmonale: Secondary | ICD-10-CM

## 2022-08-16 DIAGNOSIS — I10 Essential (primary) hypertension: Secondary | ICD-10-CM | POA: Insufficient documentation

## 2022-08-16 LAB — LACTATE DEHYDROGENASE: LDH: 111 U/L (ref 98–192)

## 2022-08-16 LAB — CBC WITH DIFFERENTIAL (CANCER CENTER ONLY)
Abs Immature Granulocytes: 0.02 10*3/uL (ref 0.00–0.07)
Basophils Absolute: 0 10*3/uL (ref 0.0–0.1)
Basophils Relative: 0 %
Eosinophils Absolute: 0.2 10*3/uL (ref 0.0–0.5)
Eosinophils Relative: 3 %
HCT: 42.8 % (ref 39.0–52.0)
Hemoglobin: 14.3 g/dL (ref 13.0–17.0)
Immature Granulocytes: 0 %
Lymphocytes Relative: 26 %
Lymphs Abs: 1.8 10*3/uL (ref 0.7–4.0)
MCH: 28.9 pg (ref 26.0–34.0)
MCHC: 33.4 g/dL (ref 30.0–36.0)
MCV: 86.5 fL (ref 80.0–100.0)
Monocytes Absolute: 0.6 10*3/uL (ref 0.1–1.0)
Monocytes Relative: 9 %
Neutro Abs: 4.2 10*3/uL (ref 1.7–7.7)
Neutrophils Relative %: 62 %
Platelet Count: 198 10*3/uL (ref 150–400)
RBC: 4.95 MIL/uL (ref 4.22–5.81)
RDW: 13.1 % (ref 11.5–15.5)
WBC Count: 6.8 10*3/uL (ref 4.0–10.5)
nRBC: 0 % (ref 0.0–0.2)

## 2022-08-16 LAB — CMP (CANCER CENTER ONLY)
ALT: 28 U/L (ref 0–44)
AST: 28 U/L (ref 15–41)
Albumin: 4.5 g/dL (ref 3.5–5.0)
Alkaline Phosphatase: 51 U/L (ref 38–126)
Anion gap: 9 (ref 5–15)
BUN: 11 mg/dL (ref 8–23)
CO2: 26 mmol/L (ref 22–32)
Calcium: 10.2 mg/dL (ref 8.9–10.3)
Chloride: 102 mmol/L (ref 98–111)
Creatinine: 1.18 mg/dL (ref 0.61–1.24)
GFR, Estimated: >60 mL/min (ref >60)
Glucose, Bld: 105 mg/dL — ABNORMAL HIGH (ref 70–99)
Potassium: 4 mmol/L (ref 3.5–5.1)
Sodium: 137 mmol/L (ref 135–145)
Total Bilirubin: 1 mg/dL (ref 0.3–1.2)
Total Protein: 7.6 g/dL (ref 6.5–8.1)

## 2022-08-16 LAB — ANTITHROMBIN III: AntiThromb III Func: 76 % (ref 75–120)
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
## 2022-08-17 LAB — HOMOCYSTEINE: Homocysteine: 18.2 umol/L (ref 0.0–19.2)

## 2022-08-18 LAB — BETA-2-GLYCOPROTEIN I ABS, IGG/M/A
Beta-2 Glyco I IgG: <9 GPI IgG units (ref 0–20)
Beta-2-Glycoprotein I IgA: <9 GPI IgA units (ref 0–25)
Beta-2-Glycoprotein I IgM: 9 GPI IgM units (ref 0–32)

## 2022-08-18 LAB — PROTEIN C ACTIVITY: Protein C Activity: 111 % (ref 73–180)

## 2022-08-18 LAB — LUPUS ANTICOAGULANT PANEL
DRVVT: 59 s — ABNORMAL HIGH (ref 0.0–47.0)
PTT Lupus Anticoagulant: 37.2 s (ref 0.0–43.5)

## 2022-08-18 LAB — PROTEIN S, TOTAL: Protein S Ag, Total: 68 % (ref 60–150)

## 2022-08-18 LAB — DRVVT CONFIRM: dRVVT Confirm: 0.9 ratio (ref 0.8–1.2)

## 2022-08-18 LAB — DRVVT MIX: dRVVT Mix: 48 s — ABNORMAL HIGH (ref 0.0–40.4)

## 2022-08-18 LAB — PROTEIN S ACTIVITY: Protein S Activity: 86 % (ref 63–140)

## 2022-08-20 LAB — PROTEIN C, TOTAL: Protein C, Total: 97 % (ref 60–150)

## 2022-08-20 LAB — CARDIOLIPIN ANTIBODIES, IGG, IGM, IGA
Anticardiolipin IgA: <9 APL U/mL (ref 0–11)
Anticardiolipin IgG: <9 GPL U/mL (ref 0–14)
Anticardiolipin IgM: <9 MPL U/mL (ref 0–12)

## 2022-08-26 LAB — FACTOR 5 LEIDEN

## 2022-08-29 ENCOUNTER — Telehealth: Payer: Self-pay | Admitting: Family

## 2022-08-29 LAB — PROTHROMBIN GENE MUTATION

## 2022-08-29 NOTE — Telephone Encounter (Signed)
Left message with call back number to go over Hyper coag panel results and 6 month follow-up.

## 2022-08-30 ENCOUNTER — Encounter: Payer: Self-pay | Admitting: Family

## 2022-08-30 ENCOUNTER — Telehealth: Payer: Self-pay | Admitting: Family

## 2022-08-30 NOTE — Telephone Encounter (Signed)
I was able to speak with the patient's daughter Eliot Ford (confirmed on info release) and go over his recent hyper coag pane which was negative. We will plan to see him back in another 6 months for follow-up. He will continue his same regimen with Eliquis 5 mg PO BID. No questions or concerns at this time. Daughter was appreciative of call.

## 2022-09-26 DIAGNOSIS — R600 Localized edema: Secondary | ICD-10-CM | POA: Diagnosis not present

## 2022-09-26 DIAGNOSIS — M25561 Pain in right knee: Secondary | ICD-10-CM | POA: Diagnosis not present

## 2022-09-27 ENCOUNTER — Encounter: Payer: Self-pay | Admitting: Family Medicine

## 2022-09-27 ENCOUNTER — Ambulatory Visit (INDEPENDENT_AMBULATORY_CARE_PROVIDER_SITE_OTHER): Payer: Medicare HMO | Admitting: Family Medicine

## 2022-09-27 VITALS — BP 131/68 | HR 64 | Resp 18 | Ht 70.5 in | Wt 216.6 lb

## 2022-09-27 DIAGNOSIS — R7989 Other specified abnormal findings of blood chemistry: Secondary | ICD-10-CM | POA: Diagnosis not present

## 2022-09-27 DIAGNOSIS — R600 Localized edema: Secondary | ICD-10-CM

## 2022-09-27 LAB — COMPREHENSIVE METABOLIC PANEL
ALT: 30 U/L (ref 0–53)
AST: 28 U/L (ref 0–37)
Albumin: 4.4 g/dL (ref 3.5–5.2)
Alkaline Phosphatase: 59 U/L (ref 39–117)
BUN: 13 mg/dL (ref 6–23)
CO2: 26 mEq/L (ref 19–32)
Calcium: 10 mg/dL (ref 8.4–10.5)
Chloride: 105 mEq/L (ref 96–112)
Creatinine, Ser: 1.08 mg/dL (ref 0.40–1.50)
GFR: 65.08 mL/min (ref >60.00)
Glucose, Bld: 112 mg/dL — ABNORMAL HIGH (ref 70–99)
Potassium: 4.5 mEq/L (ref 3.5–5.1)
Sodium: 140 mEq/L (ref 135–145)
Total Bilirubin: 1 mg/dL (ref 0.2–1.2)
Total Protein: 7.4 g/dL (ref 6.0–8.3)

## 2022-09-27 LAB — CBC WITH DIFFERENTIAL/PLATELET
Basophils Absolute: 0 10*3/uL (ref 0.0–0.1)
Basophils Relative: 0.5 % (ref 0.0–3.0)
Eosinophils Absolute: 0.1 10*3/uL (ref 0.0–0.7)
Eosinophils Relative: 2.1 % (ref 0.0–5.0)
HCT: 44.1 % (ref 39.0–52.0)
Hemoglobin: 14.9 g/dL (ref 13.0–17.0)
Lymphocytes Relative: 27.8 % (ref 12.0–46.0)
Lymphs Abs: 1.5 10*3/uL (ref 0.7–4.0)
MCHC: 33.8 g/dL (ref 30.0–36.0)
MCV: 87.3 fl (ref 78.0–100.0)
Monocytes Absolute: 0.6 10*3/uL (ref 0.1–1.0)
Monocytes Relative: 10 % (ref 3.0–12.0)
Neutro Abs: 3.3 10*3/uL (ref 1.4–7.7)
Neutrophils Relative %: 59.6 % (ref 43.0–77.0)
Platelets: 221 10*3/uL (ref 150.0–400.0)
RBC: 5.05 Mil/uL (ref 4.22–5.81)
RDW: 14.1 % (ref 11.5–15.5)
WBC: 5.5 10*3/uL (ref 4.0–10.5)

## 2022-09-27 LAB — BRAIN NATRIURETIC PEPTIDE: Pro B Natriuretic peptide (BNP): 25 pg/mL (ref 0.0–100.0)

## 2022-09-27 LAB — D-DIMER, QUANTITATIVE: D-Dimer, Quant: 0.68 mcg/mL FEU — ABNORMAL HIGH (ref <0.50)

## 2022-09-27 NOTE — Addendum Note (Signed)
Addended by: Caleen Jobs B on: 09/27/2022 01:51 PM   Modules accepted: Orders

## 2022-09-27 NOTE — Progress Notes (Signed)
Acute Office Visit  Subjective:     Patient ID: Victor Cooper, male    DOB: April 16, 1941, 82 y.o.   MRN: [id number]  Chief Complaint  Patient presents with   Leg Swelling    Onset 3 weeks  Swells the more he walks  Right leg      HPI Patient is in today for right leg swelling.   Patient states that for the past 3 weeks or so he has noticed occasional swelling to his right lower leg. Reports it always looks normal in the morning, but if he is up on his feet throughout the day (often) he will notice it begin to swell, starting at ankle and moving up towards knee. He saw ortho recently to ensure it was not related to his knee (replacement in 2022) and they did not find anything abnormal. He denies any pain, erythema, weeping, change to ROM. States he does not eat much sodium. He tries to stay very active. He does have some varicose veins and history of left leg DVT in 2009 after left knee replacement. Patient denies any chest pain, palpitations, dyspnea, wheezing,  recurrent headaches, vision changes.       ROS All review of systems negative except what is listed in the HPI      Objective:    BP 131/68 (BP Location: Right Arm, Patient Position: Sitting, Cuff Size: Normal)   Pulse 64   Resp 18   Ht 5' 10.5" (1.791 m)   Wt 216 lb 9.6 oz (98.2 kg)   SpO2 99%   BMI 30.64 kg/m    Physical Exam Vitals reviewed.  Constitutional:      Appearance: Normal appearance.  Cardiovascular:     Rate and Rhythm: Normal rate and regular rhythm.     Heart sounds: Murmur heard.  Pulmonary:     Effort: Pulmonary effort is normal.     Breath sounds: Normal breath sounds. No rhonchi or rales.  Musculoskeletal:        General: No tenderness. Normal range of motion.     Comments: Minimal RLE, +1, negative Homans sign  Skin:    General: Skin is warm and dry.     Capillary Refill: Capillary refill takes less than 2 seconds.     Findings: No erythema, lesion or rash.  Neurological:      General: No focal deficit present.     Mental Status: He is alert and oriented to person, place, and time. Mental status is at baseline.  Psychiatric:        Mood and Affect: Mood normal.        Behavior: Behavior normal.        Thought Content: Thought content normal.        Judgment: Judgment normal.     No results found for any visits on 09/27/22.      Assessment & Plan:   Problem List Items Addressed This Visit   None Visit Diagnoses     Leg edema, right    -  Primary No alarm findings on exam. Edema is minimal this morning, but reports it worsens later in the day.  Likely dependent, but want to rule out DVT given history. Labs today.  Recommend compression socks and leg elevation. Discussed diet as well. Continue activity as tolerated.  Scheduled for routine follow-up with PCP in 2 weeks - reassess at that time. Follow-up sooner if symptoms worsen.      Relevant Orders   Comp Met (CMET)  D-Dimer, Quantitative   CBC w/Diff BNP       No orders of the defined types were placed in this encounter.   Return if symptoms worsen or fail to improve.  Terrilyn Saver, NP

## 2022-09-27 NOTE — Patient Instructions (Signed)
Labs today Recommend compression socks, elevate legs when seated  Please contact office for follow-up if symptoms do not improve or worsen. Seek emergency care if symptoms become severe.

## 2022-09-29 ENCOUNTER — Ambulatory Visit (HOSPITAL_BASED_OUTPATIENT_CLINIC_OR_DEPARTMENT_OTHER)
Admission: RE | Admit: 2022-09-29 | Discharge: 2022-09-29 | Disposition: A | Discharge status: Home / Self Care | Payer: Medicare HMO | Source: Ambulatory Visit | Attending: Family Medicine | Admitting: Family Medicine

## 2022-09-29 DIAGNOSIS — R600 Localized edema: Secondary | ICD-10-CM | POA: Diagnosis not present

## 2022-09-29 DIAGNOSIS — M7989 Other specified soft tissue disorders: Secondary | ICD-10-CM | POA: Diagnosis not present

## 2022-09-29 DIAGNOSIS — M79604 Pain in right leg: Secondary | ICD-10-CM | POA: Diagnosis not present

## 2022-09-29 DIAGNOSIS — R7989 Other specified abnormal findings of blood chemistry: Secondary | ICD-10-CM | POA: Insufficient documentation

## 2022-10-10 NOTE — Assessment & Plan Note (Signed)
Encouraged DASH or MIND diet, decrease po intake and increase exercise as tolerated. Needs 7-8 hours of sleep nightly. Avoid trans fats, eat small, frequent meals every 4-5 hours with lean proteins, complex carbs and healthy fats. Minimize simple carbs, high fat foods and processed foods 

## 2022-10-10 NOTE — Assessment & Plan Note (Signed)
Well controlled, no changes to meds. Encouraged heart healthy diet such as the DASH diet and exercise as tolerated.  °

## 2022-10-10 NOTE — Assessment & Plan Note (Signed)
Continues to note DOE

## 2022-10-10 NOTE — Assessment & Plan Note (Signed)
hgba1c acceptable, minimize simple carbs. Increase exercise as tolerated.  

## 2022-10-11 ENCOUNTER — Ambulatory Visit (INDEPENDENT_AMBULATORY_CARE_PROVIDER_SITE_OTHER): Payer: Medicare HMO | Admitting: Family Medicine

## 2022-10-11 VITALS — BP 122/68 | HR 67 | Temp 98.0°F | Resp 16 | Ht 69.0 in | Wt 215.8 lb

## 2022-10-11 DIAGNOSIS — I10 Essential (primary) hypertension: Secondary | ICD-10-CM | POA: Diagnosis not present

## 2022-10-11 DIAGNOSIS — R0609 Other forms of dyspnea: Secondary | ICD-10-CM | POA: Diagnosis not present

## 2022-10-11 DIAGNOSIS — E669 Obesity, unspecified: Secondary | ICD-10-CM | POA: Diagnosis not present

## 2022-10-11 DIAGNOSIS — J449 Chronic obstructive pulmonary disease, unspecified: Secondary | ICD-10-CM

## 2022-10-11 DIAGNOSIS — R739 Hyperglycemia, unspecified: Secondary | ICD-10-CM | POA: Diagnosis not present

## 2022-10-11 NOTE — Patient Instructions (Addendum)
Orgain protein drinks when you need to replace a meal Protein with each meal and each snack 60-80 ounces of water daily  RSV, Respiratory Syncitial Virus vaccine, Arexvy at pharmacy   Edema  Edema is when you have too much fluid in your body or under your skin. Edema may make your legs, feet, and ankles swell. Swelling often happens in looser tissues, such as around your eyes. This is a common condition. It gets more common as you get older. There are many possible causes of edema. These include: Eating too much salt (sodium). Being on your feet or sitting for a long time. Certain medical conditions, such as: Pregnancy. Heart failure. Liver disease. Kidney disease. Cancer. Hot weather may make edema worse. Edema is usually painless. Your skin may look swollen or shiny. Follow these instructions at home: Medicines Take over-the-counter and prescription medicines only as told by your doctor. Your doctor may prescribe a medicine to help your body get rid of extra water (diuretic). Take this medicine if you are told to take it. Eating and drinking Eat a low-salt (low-sodium) diet as told by your doctor. Sometimes, eating less salt may reduce swelling. Depending on the cause of your swelling, you may need to limit how much fluid you drink (fluid restriction). General instructions Raise the injured area above the level of your heart while you are sitting or lying down. Do not sit still or stand for a long time. Do not wear tight clothes. Do not wear garters on your upper legs. Exercise your legs. This can help the swelling go down. Wear compression stockings as told by your doctor. It is important that these are the right size. These should be prescribed by your doctor to prevent possible injuries. If elastic bandages or wraps are recommended, use them as told by your doctor. Contact a doctor if: Treatment is not working. You have heart, liver, or kidney disease and have symptoms of  edema. You have sudden and unexplained weight gain. Get help right away if: You have shortness of breath or chest pain. You cannot breathe when you lie down. You have pain, redness, or warmth in the swollen areas. You have heart, liver, or kidney disease and get edema all of a sudden. You have a fever and your symptoms get worse all of a sudden. These symptoms may be an emergency. Get help right away. Call 911. Do not wait to see if the symptoms will go away. Do not drive yourself to the hospital. Summary Edema is when you have too much fluid in your body or under your skin. Edema may make your legs, feet, and ankles swell. Swelling often happens in looser tissues, such as around your eyes. Raise the injured area above the level of your heart while you are sitting or lying down. Follow your doctor's instructions about diet and how much fluid you can drink. This information is not intended to replace advice given to you by your health care provider. Make sure you discuss any questions you have with your health care provider. Document Revised: 02/22/2021 Document Reviewed: 02/22/2021 Elsevier Patient Education  2023 [employer].
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
## 2022-10-12 NOTE — Assessment & Plan Note (Signed)
No complaints of recent flare or worsening of dyspnea.

## 2022-11-29 ENCOUNTER — Telehealth: Payer: Self-pay | Admitting: Family Medicine

## 2022-11-29 NOTE — Telephone Encounter (Signed)
Will update when get forms

## 2022-11-29 NOTE — Telephone Encounter (Signed)
Pt's daughter dropped off form in the office, forms placed on provider's box.

## 2022-11-29 NOTE — Telephone Encounter (Signed)
Patient's daughter called to advise that her FMLA expired on 10/31/22. She thought she was good for a year but it was for 6 months. She will bring the paperwork by or fax it to [place].

## 2022-11-29 NOTE — Telephone Encounter (Signed)
Daughter said that the form needs to be dated from 10/31/22 and dated out for 6 mths so she is covered with work.

## 2022-11-30 NOTE — Telephone Encounter (Signed)
Received  FMLA forms placed in Dr.Blyth bin

## 2022-12-06 ENCOUNTER — Telehealth: Payer: Medicare HMO | Admitting: Family Medicine

## 2023-01-16 ENCOUNTER — Encounter: Payer: Self-pay | Admitting: Family Medicine

## 2023-01-16 ENCOUNTER — Ambulatory Visit: Payer: Medicare HMO | Admitting: Family Medicine

## 2023-01-16 ENCOUNTER — Other Ambulatory Visit: Payer: Self-pay | Admitting: Family Medicine

## 2023-01-16 VITALS — BP 120/78 | HR 81 | Temp 98.9°F | Resp 18 | Ht 69.0 in | Wt 211.0 lb

## 2023-01-16 DIAGNOSIS — U071 COVID-19: Secondary | ICD-10-CM | POA: Diagnosis not present

## 2023-01-16 DIAGNOSIS — R051 Acute cough: Secondary | ICD-10-CM

## 2023-01-16 DIAGNOSIS — R739 Hyperglycemia, unspecified: Secondary | ICD-10-CM

## 2023-01-16 DIAGNOSIS — R5383 Other fatigue: Secondary | ICD-10-CM | POA: Diagnosis not present

## 2023-01-16 LAB — POC COVID19 BINAXNOW: SARS Coronavirus 2 Ag: POSITIVE — AB

## 2023-01-16 MED ORDER — PROMETHAZINE-DM 6.25-15 MG/5ML PO SYRP: 5.0000 mL | ORAL_SOLUTION | Freq: Four times a day (QID) | ORAL | 0 refills | Status: DC | PRN

## 2023-01-16 MED ORDER — BLOOD GLUCOSE MONITOR KIT: PACK | 5 refills | Status: AC

## 2023-01-16 MED ORDER — NIRMATRELVIR/RITONAVIR (PAXLOVID)TABLET: 3.0000 | ORAL_TABLET | Freq: Two times a day (BID) | ORAL | 0 refills | Status: AC

## 2023-01-16 NOTE — Patient Instructions (Signed)
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
## 2023-01-16 NOTE — Assessment & Plan Note (Signed)
Paxlovid sent to pharmacy  Quarantine for 5 days then wear mask 5 more days when you leave the house

## 2023-02-11 IMAGING — MG DIGITAL DIAGNOSTIC BILAT W/ TOMO W/ CAD
6 of 10 series · 6 of 30 positions shown · non-contrast
Comparison: None.

ACR Breast Density Category a: The breast tissue is almost entirely
fatty.

CLINICAL DATA: 80-year-old male presenting for evaluation of a
palpable lump in the upper inner right breast. If the patient is on
Coumadin.

EXAM:
DIGITAL DIAGNOSTIC BILATERAL MAMMOGRAM WITH TOMOSYNTHESIS AND CAD;
ULTRASOUND RIGHT BREAST LIMITED
TECHNIQUE: Bilateral digital diagnostic mammography and breast tomosynthesis
was performed. The images were evaluated with computer-aided
detection.; Targeted ultrasound examination of the right breast was
performed

[L MLO synth-2D]
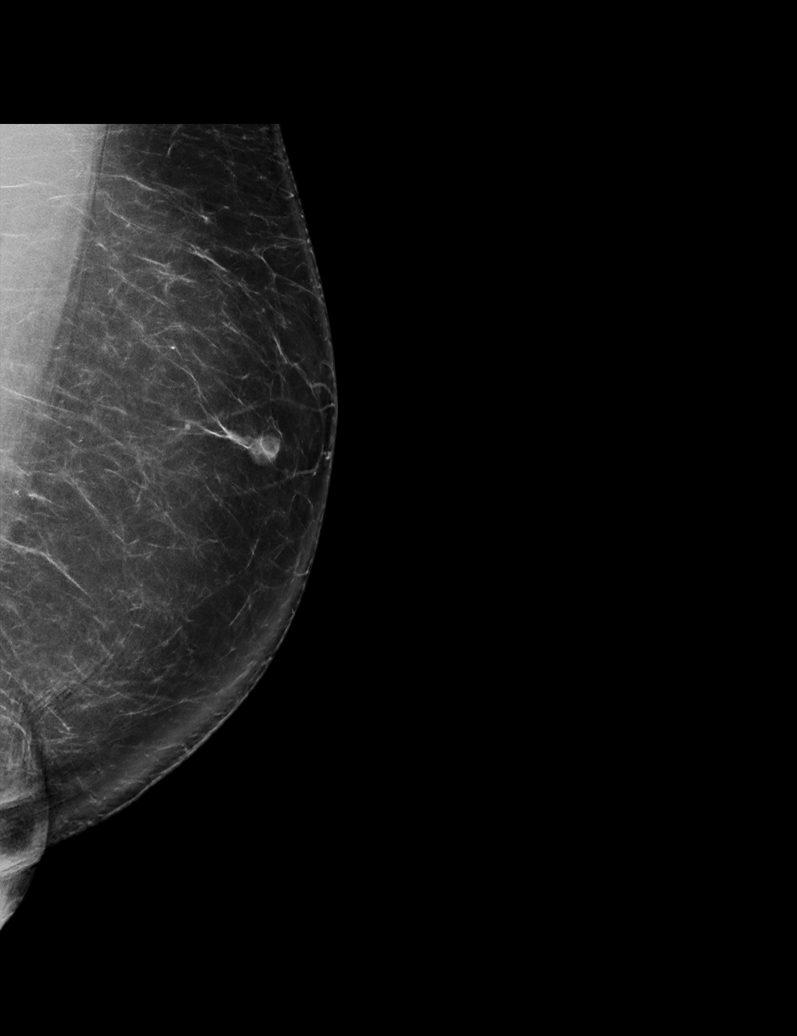

[R TAN synth-2D]
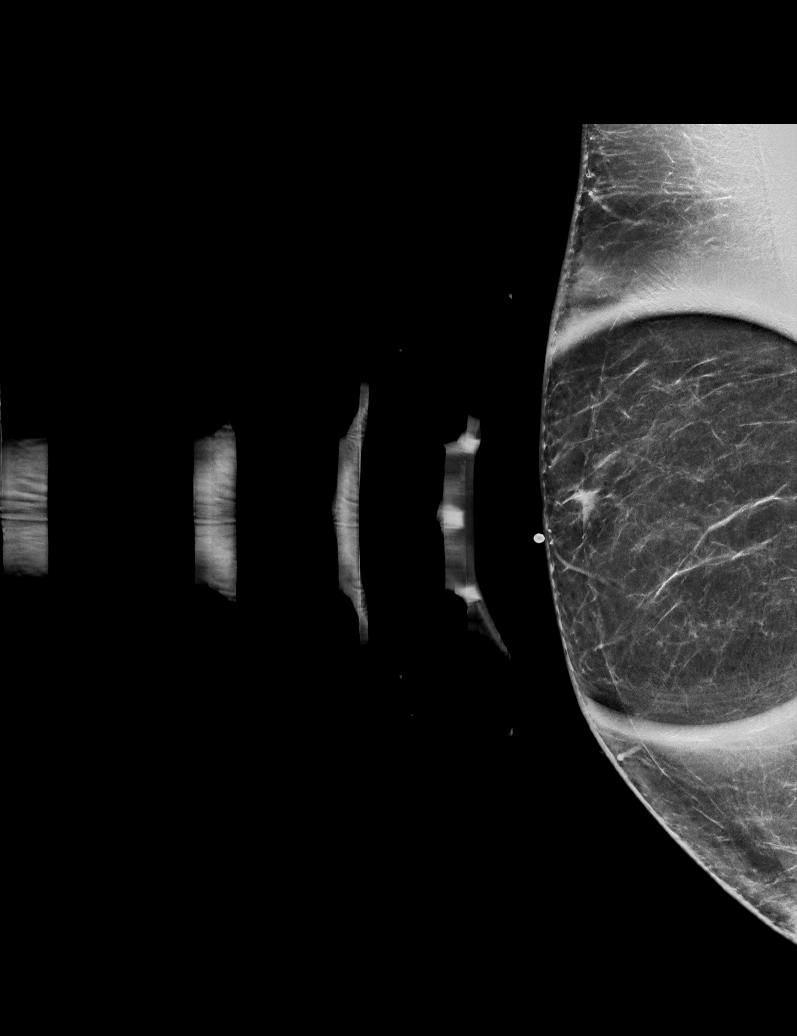

[R MLO synth-2D]
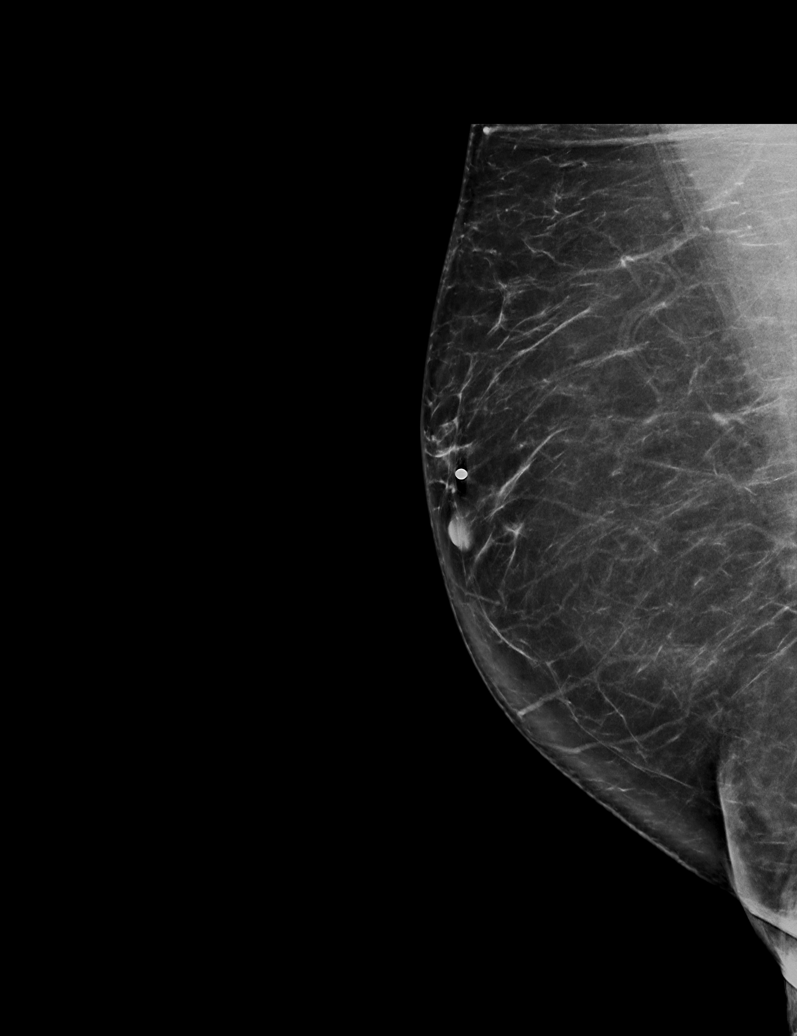

[R CC synth-2D]
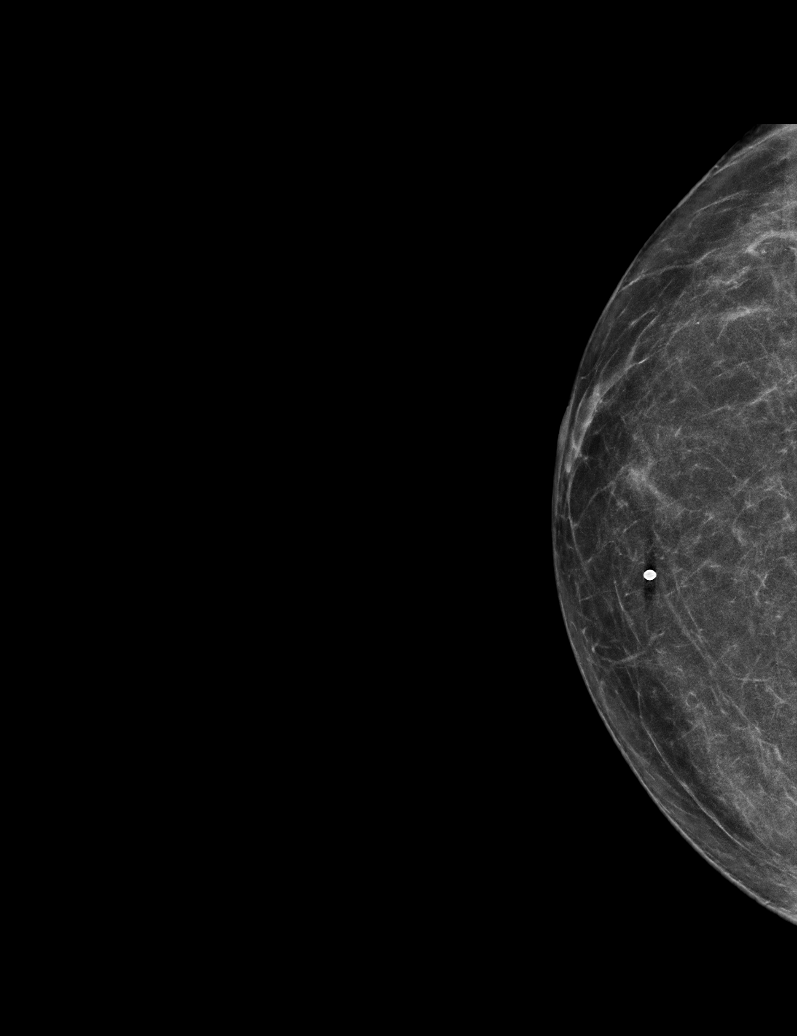

[L CC synth-2D]
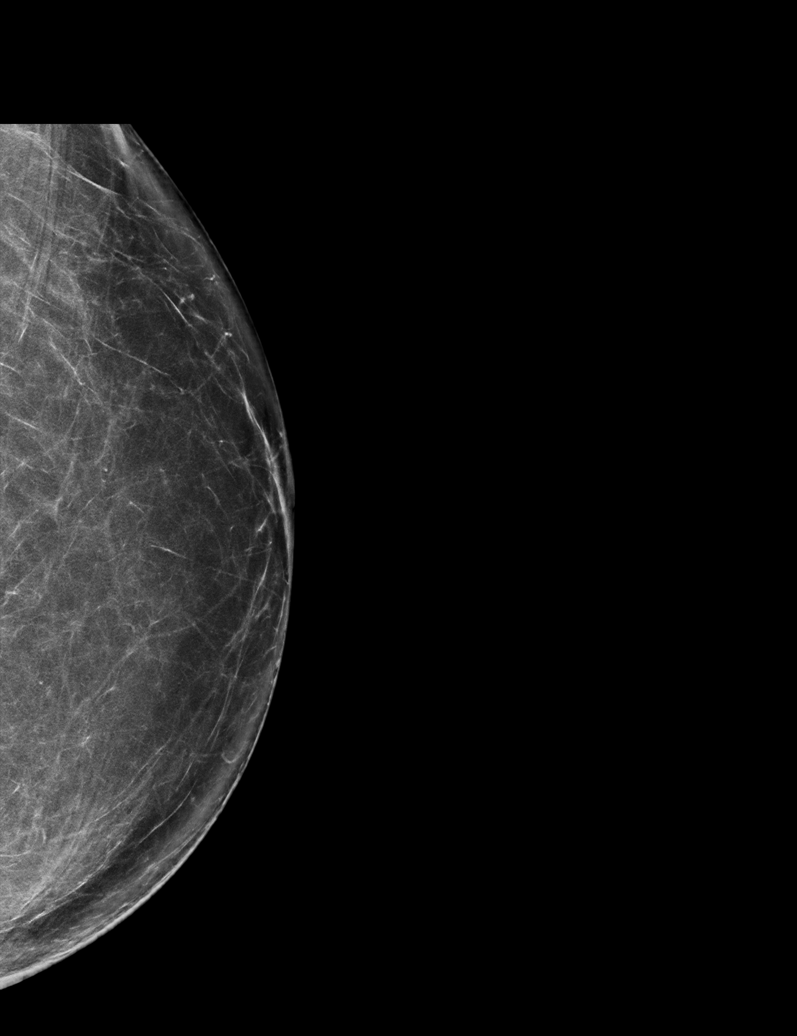

[R TAN tomo · tomo slice 35/68.0]
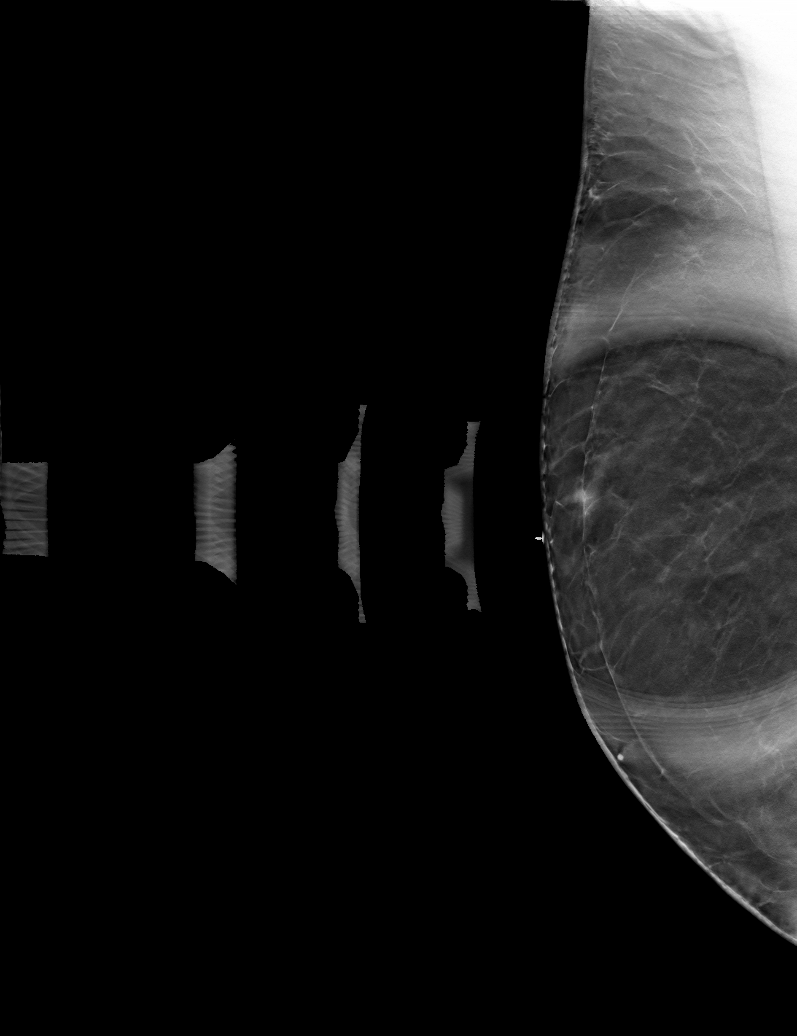

[6 of 30 positions shown; findings below may reference images not displayed]

FINDINGS: A BB indicating the palpable site of concern has been placed on the
upper inner quadrant of the right breast. There is a small
superficial asymmetry within this region, no defined masses or
suspicious areas of shadowing are identified.

Physical exam of the palpable site demonstrates a superficial
palpable lump in the upper inner breast.

Ultrasound targeted to the right breast at 1 o'clock, 3 cm from the
nipple demonstrates a superficial echogenic area measuring
approximately 1.8 x 0.6 cm.
IMPRESSION: 1. The palpable lump in the right breast likely represents benign
fat necrosis.

2.  No mammographic evidence of malignancy in the bilateral breasts.

RECOMMENDATION:
Six-month follow-up diagnostic right breast mammogram and
ultrasound.

I have discussed the findings and recommendations with the patient.
If applicable, a reminder letter will be sent to the patient
regarding the next appointment.

BI-RADS CATEGORY  3: Probably benign.

## 2023-02-16 ENCOUNTER — Encounter (INDEPENDENT_AMBULATORY_CARE_PROVIDER_SITE_OTHER): Payer: Self-pay

## 2023-02-21 DIAGNOSIS — Z8551 Personal history of malignant neoplasm of bladder: Secondary | ICD-10-CM | POA: Diagnosis not present

## 2023-02-21 DIAGNOSIS — R350 Frequency of micturition: Secondary | ICD-10-CM | POA: Diagnosis not present

## 2023-02-21 DIAGNOSIS — N401 Enlarged prostate with lower urinary tract symptoms: Secondary | ICD-10-CM | POA: Diagnosis not present

## 2023-02-24 ENCOUNTER — Other Ambulatory Visit: Payer: Self-pay

## 2023-02-24 DIAGNOSIS — I2609 Other pulmonary embolism with acute cor pulmonale: Secondary | ICD-10-CM

## 2023-02-27 ENCOUNTER — Inpatient Hospital Stay: Payer: Medicare HMO | Attending: Hematology & Oncology

## 2023-02-27 ENCOUNTER — Inpatient Hospital Stay: Payer: Medicare HMO | Admitting: Family

## 2023-03-01 ENCOUNTER — Other Ambulatory Visit: Payer: Self-pay | Admitting: Family

## 2023-03-06 NOTE — Assessment & Plan Note (Signed)
hgba1c acceptable, minimize simple carbs. Increase exercise as tolerated.  

## 2023-03-06 NOTE — Assessment & Plan Note (Signed)
Hydrate and monitor 

## 2023-03-06 NOTE — Assessment & Plan Note (Signed)
Encourage heart healthy diet such as MIND or DASH diet, increase exercise, avoid trans fats, simple carbohydrates and processed foods, consider a krill or fish or flaxseed oil cap daily.  °

## 2023-03-06 NOTE — Assessment & Plan Note (Signed)
Well controlled, no changes to meds. Encouraged heart healthy diet such as the DASH diet and exercise as tolerated.  °

## 2023-03-06 NOTE — Assessment & Plan Note (Signed)
No complaints of recent flare or worsening of dyspnea.

## 2023-03-06 NOTE — Assessment & Plan Note (Signed)
Tolerating eliquis

## 2023-03-07 ENCOUNTER — Ambulatory Visit (INDEPENDENT_AMBULATORY_CARE_PROVIDER_SITE_OTHER): Payer: Medicare HMO | Admitting: Family Medicine

## 2023-03-07 VITALS — BP 118/70 | HR 66 | Temp 97.8°F | Resp 16 | Ht 69.0 in | Wt 211.0 lb

## 2023-03-07 DIAGNOSIS — Z8739 Personal history of other diseases of the musculoskeletal system and connective tissue: Secondary | ICD-10-CM | POA: Diagnosis not present

## 2023-03-07 DIAGNOSIS — M069 Rheumatoid arthritis, unspecified: Secondary | ICD-10-CM | POA: Diagnosis not present

## 2023-03-07 DIAGNOSIS — J449 Chronic obstructive pulmonary disease, unspecified: Secondary | ICD-10-CM | POA: Diagnosis not present

## 2023-03-07 DIAGNOSIS — I10 Essential (primary) hypertension: Secondary | ICD-10-CM | POA: Diagnosis not present

## 2023-03-07 DIAGNOSIS — I2699 Other pulmonary embolism without acute cor pulmonale: Secondary | ICD-10-CM

## 2023-03-07 DIAGNOSIS — R739 Hyperglycemia, unspecified: Secondary | ICD-10-CM

## 2023-03-07 DIAGNOSIS — E7800 Pure hypercholesterolemia, unspecified: Secondary | ICD-10-CM | POA: Diagnosis not present

## 2023-03-07 LAB — COMPREHENSIVE METABOLIC PANEL
ALT: 30 U/L (ref 0–53)
AST: 30 U/L (ref 0–37)
Albumin: 4.2 g/dL (ref 3.5–5.2)
Alkaline Phosphatase: 56 U/L (ref 39–117)
BUN: 15 mg/dL (ref 6–23)
CO2: 26 meq/L (ref 19–32)
Calcium: 9.8 mg/dL (ref 8.4–10.5)
Chloride: 102 meq/L (ref 96–112)
Creatinine, Ser: 1.18 mg/dL (ref 0.40–1.50)
GFR: 58.34 mL/min — ABNORMAL LOW (ref >60.00)
Glucose, Bld: 88 mg/dL (ref 70–99)
Potassium: 4.5 meq/L (ref 3.5–5.1)
Sodium: 137 meq/L (ref 135–145)
Total Bilirubin: 1.1 mg/dL (ref 0.2–1.2)
Total Protein: 7.4 g/dL (ref 6.0–8.3)

## 2023-03-07 LAB — LIPID PANEL
Cholesterol: 134 mg/dL (ref 0–200)
HDL: 49.5 mg/dL (ref >39.00)
LDL Cholesterol: 66 mg/dL (ref 0–99)
NonHDL: 84.85
Total CHOL/HDL Ratio: 3
Triglycerides: 94 mg/dL (ref 0.0–149.0)
VLDL: 18.8 mg/dL (ref 0.0–40.0)

## 2023-03-07 LAB — HEMOGLOBIN A1C: Hgb A1c MFr Bld: 6.3 % (ref 4.6–6.5)

## 2023-03-07 LAB — CBC WITH DIFFERENTIAL/PLATELET
Basophils Absolute: 0 10*3/uL (ref 0.0–0.1)
Basophils Relative: 0.6 % (ref 0.0–3.0)
Eosinophils Absolute: 0.2 10*3/uL (ref 0.0–0.7)
Eosinophils Relative: 2.1 % (ref 0.0–5.0)
HCT: 44.3 % (ref 39.0–52.0)
Hemoglobin: 14.4 g/dL (ref 13.0–17.0)
Lymphocytes Relative: 34.6 % (ref 12.0–46.0)
Lymphs Abs: 2.5 10*3/uL (ref 0.7–4.0)
MCHC: 32.6 g/dL (ref 30.0–36.0)
MCV: 88.9 fl (ref 78.0–100.0)
Monocytes Absolute: 1 10*3/uL (ref 0.1–1.0)
Monocytes Relative: 13.1 % — ABNORMAL HIGH (ref 3.0–12.0)
Neutro Abs: 3.6 10*3/uL (ref 1.4–7.7)
Neutrophils Relative %: 49.6 % (ref 43.0–77.0)
Platelets: 211 10*3/uL (ref 150.0–400.0)
RBC: 4.98 Mil/uL (ref 4.22–5.81)
RDW: 14.5 % (ref 11.5–15.5)
WBC: 7.2 10*3/uL (ref 4.0–10.5)

## 2023-03-07 LAB — URIC ACID: Uric Acid, Serum: 7.2 mg/dL (ref 4.0–7.8)

## 2023-03-07 NOTE — Assessment & Plan Note (Signed)
Previously seen by Dr Kellie Simmering and diagnosed with Rheumatoid Arthritis and OA. He is ready to go back to rheumatology and evaluated his options for treatment. Consider Acetaminophen/Tylenol ES 500 mg 1-2 tabs tid prn

## 2023-03-07 NOTE — Patient Instructions (Signed)
Tylenol/Arthritis ES 500 mg tabs, 1 to 2 tabs up to three times a day max of 3000 mg in 24 hours  No other over the counter pain meds

## 2023-03-08 ENCOUNTER — Encounter: Payer: Self-pay | Admitting: Family Medicine

## 2023-03-08 LAB — TSH: TSH: 0.97 u[IU]/mL (ref 0.35–5.50)
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
## 2023-03-10 ENCOUNTER — Inpatient Hospital Stay (HOSPITAL_BASED_OUTPATIENT_CLINIC_OR_DEPARTMENT_OTHER): Payer: Medicare HMO | Admitting: Family

## 2023-03-10 ENCOUNTER — Encounter: Payer: Self-pay | Admitting: Family

## 2023-03-10 ENCOUNTER — Inpatient Hospital Stay: Payer: Medicare HMO | Attending: Hematology & Oncology

## 2023-03-10 VITALS — BP 139/70 | HR 54 | Temp 97.7°F | Resp 18 | Wt 211.0 lb

## 2023-03-10 DIAGNOSIS — Z7901 Long term (current) use of anticoagulants: Secondary | ICD-10-CM | POA: Diagnosis not present

## 2023-03-10 DIAGNOSIS — I2609 Other pulmonary embolism with acute cor pulmonale: Secondary | ICD-10-CM

## 2023-03-10 DIAGNOSIS — I2699 Other pulmonary embolism without acute cor pulmonale: Secondary | ICD-10-CM

## 2023-03-10 DIAGNOSIS — Z86711 Personal history of pulmonary embolism: Secondary | ICD-10-CM | POA: Diagnosis not present

## 2023-03-10 DIAGNOSIS — Z86718 Personal history of other venous thrombosis and embolism: Secondary | ICD-10-CM | POA: Diagnosis not present

## 2023-03-10 DIAGNOSIS — D6859 Other primary thrombophilia: Secondary | ICD-10-CM

## 2023-03-10 LAB — CBC WITH DIFFERENTIAL (CANCER CENTER ONLY)
Abs Immature Granulocytes: 0.02 10*3/uL (ref 0.00–0.07)
Basophils Absolute: 0 10*3/uL (ref 0.0–0.1)
Basophils Relative: 0 %
Eosinophils Absolute: 0.1 10*3/uL (ref 0.0–0.5)
Eosinophils Relative: 2 %
HCT: 42.6 % (ref 39.0–52.0)
Hemoglobin: 14.3 g/dL (ref 13.0–17.0)
Immature Granulocytes: 0 %
Lymphocytes Relative: 33 %
Lymphs Abs: 1.9 10*3/uL (ref 0.7–4.0)
MCH: 29.5 pg (ref 26.0–34.0)
MCHC: 33.6 g/dL (ref 30.0–36.0)
MCV: 88 fL (ref 80.0–100.0)
Monocytes Absolute: 0.6 10*3/uL (ref 0.1–1.0)
Monocytes Relative: 10 %
Neutro Abs: 3.2 10*3/uL (ref 1.7–7.7)
Neutrophils Relative %: 55 %
Platelet Count: 187 10*3/uL (ref 150–400)
RBC: 4.84 MIL/uL (ref 4.22–5.81)
RDW: 13.2 % (ref 11.5–15.5)
WBC Count: 5.9 10*3/uL (ref 4.0–10.5)
nRBC: 0 % (ref 0.0–0.2)

## 2023-03-10 LAB — CMP (CANCER CENTER ONLY)
ALT: 29 U/L (ref 0–44)
AST: 30 U/L (ref 15–41)
Albumin: 4.5 g/dL (ref 3.5–5.0)
Alkaline Phosphatase: 56 U/L (ref 38–126)
Anion gap: 10 (ref 5–15)
BUN: 14 mg/dL (ref 8–23)
CO2: 28 mmol/L (ref 22–32)
Calcium: 10.3 mg/dL (ref 8.9–10.3)
Chloride: 101 mmol/L (ref 98–111)
Creatinine: 1.14 mg/dL (ref 0.61–1.24)
GFR, Estimated: >60 mL/min (ref >60)
Glucose, Bld: 87 mg/dL (ref 70–99)
Potassium: 4 mmol/L (ref 3.5–5.1)
Sodium: 139 mmol/L (ref 135–145)
Total Bilirubin: 1.1 mg/dL (ref 0.3–1.2)
Total Protein: 7.7 g/dL (ref 6.5–8.1)

## 2023-03-10 LAB — LACTATE DEHYDROGENASE: LDH: 115 U/L (ref 98–192)

## 2023-03-10 MED ORDER — APIXABAN 2.5 MG PO TABS: 2.5000 mg | ORAL_TABLET | Freq: Two times a day (BID) | ORAL | 5 refills | Status: DC

## 2023-03-10 NOTE — Progress Notes (Signed)
Hematology and Oncology Follow Up Visit  Victor Cooper [id number] 11/10/1940 82 y.o. 03/10/2023   Principle Diagnosis:  History of PE and left lower extremity DVT diagnosed in 06/2017  Hyper coag panel negative   Current Therapy:   Eliquis 5 mg PO BID - reduced to 2.5 mg PO BID on 03/10/2023   Interim History:  Victor Cooper is here today with his daughter Victor Cooper for follow-up. He continues to do well.  He is doing well on Eliquis and has not noted any blood loss. No abnormal bruising, no petechiae.  No fever, chills, n/v, cough, rash, dizziness, SOB, chest pain, palpitations, abdominal pain or changes in bowel or bladder habits.  No swelling or tingling noted in his extremities at this time. No falls or syncope reported.  Appetite and hydration are good. Weight is stable at 211 lbs.     ECOG Performance Status: 1 - Symptomatic but completely ambulatory  Medications:  Allergies as of 03/10/2023   No Known Allergies      Medication List        Accurate as of March 10, 2023  1:18 PM. If you have any questions, ask your nurse or doctor.          albuterol 108 (90 Base) MCG/ACT inhaler Commonly known as: VENTOLIN HFA Inhale 2 puffs into the lungs every 6 (six) hours.   amLODipine 10 MG tablet Commonly known as: NORVASC Take 1 tablet (10 mg total) by mouth daily.   blood glucose meter kit and supplies Kit Check blood sugar BID , as needed dx: E11.9   Eliquis 5 MG Tabs tablet Generic drug: apixaban Take 1 tablet by mouth twice daily   metFORMIN 500 MG tablet Commonly known as: GLUCOPHAGE Take 1 tablet by mouth once daily   olmesartan-hydrochlorothiazide 40-25 MG tablet Commonly known as: BENICAR HCT Take 1 tablet by mouth daily.   promethazine-dextromethorphan 6.25-15 MG/5ML syrup Commonly known as: PROMETHAZINE-DM Take 5 mLs by mouth 4 (four) times daily as needed.   rosuvastatin 20 MG tablet Commonly known as: CRESTOR Take 1 tablet by mouth once daily    Stiolto Respimat 2.5-2.5 MCG/ACT Aers Generic drug: Tiotropium Bromide-Olodaterol Inhale 1 puff by mouth once daily        Allergies: No Known Allergies  Past Medical History, Surgical history, Social history, and Family History were reviewed and updated.  Review of Systems: All other 10 point review of systems is negative.   Physical Exam:  vitals were not taken for this visit.   Wt Readings from Last 3 Encounters:  03/07/23 211 lb (95.7 kg)  01/16/23 211 lb (95.7 kg)  10/11/22 215 lb 12.8 oz (97.9 kg)    Ocular: Sclerae unicteric, pupils equal, round and reactive to light Ear-nose-throat: Oropharynx clear, dentition fair Lymphatic: No cervical or supraclavicular adenopathy Lungs no rales or rhonchi, good excursion bilaterally Heart regular rate and rhythm, no murmur appreciated Abd soft, nontender, positive bowel sounds MSK no focal spinal tenderness, no joint edema Neuro: non-focal, well-oriented, appropriate affect Breasts: Deferred   Lab Results  Component Value Date   WBC 7.2 03/07/2023   HGB 14.4 03/07/2023   HCT 44.3 03/07/2023   MCV 88.9 03/07/2023   PLT 211.0 03/07/2023   Lab Results  Component Value Date   FERRITIN 1,354 (H) 03/19/2020   Lab Results  Component Value Date   RBC 4.98 03/07/2023   No results found for: "KPAFRELGTCHN", "LAMBDASER", "KAPLAMBRATIO" No results found for: "IGGSERUM", "IGA", "IGMSERUM" No results found for: "TOTALPROTELP", "  ALBUMINELP", "A1GS", "A2GS", "BETS", "BETA2SER", "GAMS", "MSPIKE", "SPEI"   Chemistry      Component Value Date/Time   NA 137 03/07/2023 1233   K 4.5 03/07/2023 1233   CL 102 03/07/2023 1233   CO2 26 03/07/2023 1233   BUN 15 03/07/2023 1233   CREATININE 1.18 03/07/2023 1233   CREATININE 1.18 08/16/2022 1109      Component Value Date/Time   CALCIUM 9.8 03/07/2023 1233   ALKPHOS 56 03/07/2023 1233   AST 30 03/07/2023 1233   AST 28 08/16/2022 1109   ALT 30 03/07/2023 1233   ALT 28 08/16/2022  1109   BILITOT 1.1 03/07/2023 1233   BILITOT 1.0 08/16/2022 1109       Impression and Plan: Victor Cooper is a very pleasant 82 yo African American gentleman with history of bilateral PE with right heart strain and left lower extremity chronic DVT of the peroneal vein and DVT of indeterminate age in the popliteal vein diagnosed in December 2018.  Hyper coag panel was negative.  We will reduce his dose of Eliquis to 2.5 mg PO BID. Both he and his daughter verbalized understanding and agreement with the plan.  Follow-up in 6 months.   Eileen Stanford, NP 9/6/20241:18 PM

## 2023-03-28 ENCOUNTER — Other Ambulatory Visit: Payer: Self-pay | Admitting: Family Medicine

## 2023-03-28 DIAGNOSIS — R739 Hyperglycemia, unspecified: Secondary | ICD-10-CM

## 2023-04-21 ENCOUNTER — Other Ambulatory Visit: Payer: Self-pay | Admitting: Family

## 2023-05-24 ENCOUNTER — Other Ambulatory Visit: Payer: Self-pay | Admitting: *Deleted

## 2023-05-24 MED ORDER — ROSUVASTATIN CALCIUM 20 MG PO TABS: 20.0000 mg | ORAL_TABLET | Freq: Every day | ORAL | 1 refills | Status: DC

## 2023-07-22 ENCOUNTER — Other Ambulatory Visit: Payer: Self-pay | Admitting: Family Medicine

## 2023-08-03 ENCOUNTER — Encounter (HOSPITAL_BASED_OUTPATIENT_CLINIC_OR_DEPARTMENT_OTHER): Payer: Self-pay

## 2023-08-03 ENCOUNTER — Emergency Department (HOSPITAL_BASED_OUTPATIENT_CLINIC_OR_DEPARTMENT_OTHER)
Admission: EM | Admit: 2023-08-03 | Discharge: 2023-08-03 | Disposition: A | Discharge status: Home / Self Care | Payer: Medicare Other | Attending: Emergency Medicine | Admitting: Emergency Medicine

## 2023-08-03 ENCOUNTER — Other Ambulatory Visit: Payer: Self-pay

## 2023-08-03 DIAGNOSIS — J101 Influenza due to other identified influenza virus with other respiratory manifestations: Secondary | ICD-10-CM | POA: Diagnosis not present

## 2023-08-03 DIAGNOSIS — R509 Fever, unspecified: Secondary | ICD-10-CM | POA: Diagnosis not present

## 2023-08-03 DIAGNOSIS — Z20822 Contact with and (suspected) exposure to covid-19: Secondary | ICD-10-CM | POA: Insufficient documentation

## 2023-08-03 LAB — RESP PANEL BY RT-PCR (RSV, FLU A&B, COVID)  RVPGX2
Influenza A by PCR: POSITIVE — AB
Influenza B by PCR: NEGATIVE
Resp Syncytial Virus by PCR: NEGATIVE
SARS Coronavirus 2 by RT PCR: NEGATIVE

## 2023-08-03 MED ORDER — OSELTAMIVIR PHOSPHATE 75 MG PO CAPS: 75.0000 mg | ORAL_CAPSULE | Freq: Two times a day (BID) | ORAL | 0 refills | Status: DC

## 2023-08-03 MED ORDER — ACETAMINOPHEN 325 MG PO TABS
650.0000 mg | ORAL_TABLET | Freq: Once | ORAL | Status: AC | PRN
  Administered 2023-08-03: 650 mg via ORAL
  Filled 2023-08-03: qty 2

## 2023-08-03 MED ORDER — BENZONATATE 100 MG PO CAPS: 100.0000 mg | ORAL_CAPSULE | Freq: Three times a day (TID) | ORAL | 0 refills | Status: AC

## 2023-08-03 NOTE — ED Provider Notes (Signed)
[facility] Provider Note   CSN: [id number] Arrival date & time: 08/03/23  1913     History  Chief Complaint  Patient presents with   Cough   Fever    Victor Cooper is a 83 y.o. male.   Cough Associated symptoms: fever   Fever Associated symptoms: cough    This patient is a 83 year old male, presenting to the hospital from home after he was starting to become symptomatic today with subjective fevers and fatigue.  He has had some bodyaches, a bit of a raspy throat, he started getting sick within the last 48 hours.    Home Medications Prior to Admission medications   Medication Sig Start Date End Date Taking? Authorizing Provider  benzonatate (TESSALON) 100 MG capsule Take 1 capsule (100 mg total) by mouth every 8 (eight) hours. 08/03/23  Yes Eber Hong, MD  oseltamivir (TAMIFLU) 75 MG capsule Take 1 capsule (75 mg total) by mouth every 12 (twelve) hours. 08/03/23  Yes Eber Hong, MD  albuterol (VENTOLIN HFA) 108 (90 Base) MCG/ACT inhaler Inhale 2 puffs into the lungs every 6 (six) hours. Patient not taking: Reported on 03/10/2023 08/08/22   Sandford Craze, NP  amLODipine (NORVASC) 10 MG tablet Take 1 tablet (10 mg total) by mouth daily. Patient not taking: Reported on 03/10/2023 08/08/22   Sandford Craze, NP  apixaban (ELIQUIS) 2.5 MG TABS tablet Take 1 tablet (2.5 mg total) by mouth 2 (two) times daily. 03/10/23   Erenest Blank, NP  blood glucose meter kit and supplies KIT Check blood sugar BID , as needed dx: E11.9 01/16/23   Zola Button, Grayling Congress, DO  metFORMIN (GLUCOPHAGE) 500 MG tablet Take 1 tablet (500 mg total) by mouth daily with breakfast. 03/28/23   Bradd Canary, MD  olmesartan-hydrochlorothiazide (BENICAR HCT) 40-25 MG tablet Take 1 tablet by mouth once daily 07/24/23   Bradd Canary, MD  promethazine-dextromethorphan (PROMETHAZINE-DM) 6.25-15 MG/5ML syrup Take 5 mLs by mouth 4 (four) times daily as  needed. Patient not taking: Reported on 03/10/2023 01/16/23   Seabron Spates R, DO  rosuvastatin (CRESTOR) 20 MG tablet Take 1 tablet (20 mg total) by mouth daily. 05/24/23   Bradd Canary, MD  Tiotropium Bromide-Olodaterol (STIOLTO RESPIMAT) 2.5-2.5 MCG/ACT AERS Inhale 1 puff by mouth once daily 08/08/22   Sandford Craze, NP      Allergies    Patient has no known allergies.    Review of Systems   Review of Systems  Constitutional:  Positive for fever.  Respiratory:  Positive for cough.   All other systems reviewed and are negative.   Physical Exam Updated Vital Signs BP (!) 147/71 (BP Location: Left Arm)   Pulse 92   Temp (!) 101.1 F (38.4 C)   Resp (!) 24   Ht 1.753 m (5\' 9" )   Wt 96 kg   SpO2 95%   BMI 31.25 kg/m  Physical Exam Vitals and nursing note reviewed.  Constitutional:      General: He is not in acute distress.    Appearance: He is well-developed.  HENT:     Head: Normocephalic and atraumatic.     Mouth/Throat:     Pharynx: No oropharyngeal exudate.  Eyes:     General: No scleral icterus.       Right eye: No discharge.        Left eye: No discharge.     Conjunctiva/sclera: Conjunctivae normal.  Pupils: Pupils are equal, round, and reactive to light.  Neck:     Thyroid: No thyromegaly.     Vascular: No JVD.  Cardiovascular:     Rate and Rhythm: Normal rate and regular rhythm.     Heart sounds: Normal heart sounds. No murmur heard.    No friction rub. No gallop.  Pulmonary:     Effort: Pulmonary effort is normal. No respiratory distress.     Breath sounds: Normal breath sounds. No wheezing or rales.  Abdominal:     General: Bowel sounds are normal. There is no distension.     Palpations: Abdomen is soft. There is no mass.     Tenderness: There is no abdominal tenderness.  Musculoskeletal:        General: No tenderness. Normal range of motion.     Cervical back: Normal range of motion and neck supple.     Right lower leg: No edema.      Left lower leg: No edema.  Lymphadenopathy:     Cervical: No cervical adenopathy.  Skin:    General: Skin is warm and dry.     Findings: No erythema or rash.  Neurological:     Mental Status: He is alert.     Coordination: Coordination normal.  Psychiatric:        Behavior: Behavior normal.     ED Results / Procedures / Treatments   Labs (all labs ordered are listed, but only abnormal results are displayed) Labs Reviewed  RESP PANEL BY RT-PCR (RSV, FLU A&B, COVID)  RVPGX2 - Abnormal; Notable for the following components:      Result Value   Influenza A by PCR POSITIVE (*)    All other components within normal limits    EKG None  Radiology No results found.  Procedures Procedures    Medications Ordered in ED Medications  acetaminophen (TYLENOL) tablet 650 mg (650 mg Oral Given 08/03/23 1954)    ED Course/ Medical Decision Making/ A&P                                 Medical Decision Making Risk OTC drugs. Prescription drug management.   The patient's exam is actually very unremarkable, he has no lymphadenopathy of the neck, he has a soft nontender abdomen, clear lung sounds, no coughing throughout the exam and only a slight raspy voice.  His older oropharynx is clear, nasal passages have mild rhinorrhea and he does have a fever to 101.1 with a heart rate of 92.  He has an oxygen of 95%.  I have reviewed medical records, the patient is followed in the office because of pulmonary embolism by oncology, he was last seen in September 2024  Because the patient has a history of some COPD and prior pulmonary embolism I will place the patient on Tamiflu, Tessalon, he otherwise appears stable and I do not think he needs an x-ray at this time.  I do not think he has a bacterial pneumonia  I have discussed with the patient at the bedside the results, and the meaning of these results.  They have had opportunity to ask questions,  expressed their understanding to the need for  follow-up with primary care physician        Final Clinical Impression(s) / ED Diagnoses Final diagnoses:  Influenza A    Rx / DC Orders ED Discharge Orders  Ordered    benzonatate (TESSALON) 100 MG capsule  Every 8 hours        08/03/23 2056    oseltamivir (TAMIFLU) 75 MG capsule  Every 12 hours        08/03/23 2056              Eber Hong, MD 08/03/23 2057

## 2023-08-03 NOTE — ED Triage Notes (Signed)
Patient arrives with complaints of cough, fever, and fatigue that started today.

## 2023-08-03 NOTE — Discharge Instructions (Addendum)
Your testing shows that you have influenza, because of your medical history I want you to take Tamiflu twice a day for 5 days, I have also prescribed Tessalon for your cough, take DayQuil and NyQuil as well  Tessalon is a cough medication that helps reduce the amount of coughing that you are having.  You may take up to 200 mg every 8 hours as needed.  This can be used safely with most or other over-the-counter medications but talk to your pharmacist before taking anything else over-the-counter with it  Thank you for allowing [place] to treat you in the emergency department today.  After reviewing your examination and potential testing that was done it appears that you are safe to go home.  I would like for you to follow-up with your doctor within the next several days, have them obtain your records and follow-up with them to review all potential tests and results from your visit.  If you should develop severe or worsening symptoms return to the emergency department immediately

## 2023-08-03 NOTE — ED Notes (Signed)
Pt. Here due to flu like symptoms and has had cough that is keeping him awake at night.

## 2023-08-17 ENCOUNTER — Ambulatory Visit: Payer: Medicare Other

## 2023-08-17 VITALS — Ht 70.5 in | Wt 211.0 lb

## 2023-08-17 DIAGNOSIS — Z0000 Encounter for general adult medical examination without abnormal findings: Secondary | ICD-10-CM
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
## 2023-08-17 NOTE — Patient Instructions (Addendum)
Victor Cooper , Thank you for taking time to come for your Medicare Wellness Visit. I appreciate your ongoing commitment to your health goals. Please review the following plan we discussed and let me know if I can assist you in the future.   Referrals/Orders/Follow-Ups/Clinician Recommendations: You have been referred to [facility] for a complete eye exam. If you haven't heard from them in a few days, please call them to schedule your appointment.  [facility] [address] [place] [address] [place] [address] Phone: [phone]   This is a list of the screening recommended for you and due dates:  Health Maintenance  Topic Date Due   Medicare Annual Wellness Visit  08/16/2024   DTaP/Tdap/Td vaccine (2 - Td or Tdap) 08/29/2031   Pneumonia Vaccine  Completed   Flu Shot  Completed   Zoster (Shingles) Vaccine  Completed   HPV Vaccine  Aged Out   COVID-19 Vaccine  Discontinued   Hepatitis C Screening  Discontinued    Advanced directives: (Declined) Advance directive discussed with you today. Even though you declined this today, please call our office should you change your mind, and we can give you the proper paperwork for you to fill out.  Next Medicare Annual Wellness Visit scheduled for next year: Yes

## 2023-08-28 ENCOUNTER — Other Ambulatory Visit: Payer: Self-pay | Admitting: Family Medicine

## 2023-08-28 ENCOUNTER — Other Ambulatory Visit: Payer: Self-pay | Admitting: Family

## 2023-08-28 DIAGNOSIS — R739 Hyperglycemia, unspecified: Secondary | ICD-10-CM

## 2023-09-03 NOTE — Assessment & Plan Note (Signed)
 No recent exacerbation, no changes but did have Flu A in January

## 2023-09-03 NOTE — Assessment & Plan Note (Signed)
 Previously seen by Dr Kellie Simmering and diagnosed with Rheumatoid Arthritis and OA.

## 2023-09-03 NOTE — Assessment & Plan Note (Addendum)
 [id number] acceptable, minimize simple carbs. Increase exercise as tolerated. On Olmesartan, consider statin initiation

## 2023-09-03 NOTE — Assessment & Plan Note (Signed)
 Encouraged DASH or MIND diet, decrease po intake and increase exercise as tolerated. Needs 7-8 hours of sleep nightly. Avoid trans fats, eat small, frequent meals every 4-5 hours with lean proteins, complex carbs and healthy fats. Minimize simple carbs, high fat foods and processed foods

## 2023-09-03 NOTE — Assessment & Plan Note (Signed)
 Well controlled, no changes to meds. Encouraged heart healthy diet such as the DASH diet and exercise as tolerated.

## 2023-09-04 ENCOUNTER — Encounter: Payer: Self-pay | Admitting: Family Medicine

## 2023-09-04 ENCOUNTER — Ambulatory Visit (INDEPENDENT_AMBULATORY_CARE_PROVIDER_SITE_OTHER): Payer: Medicare HMO | Admitting: Family Medicine

## 2023-09-04 VITALS — BP 132/80 | HR 60 | Temp 97.7°F | Resp 16 | Ht 70.0 in | Wt 215.2 lb

## 2023-09-04 DIAGNOSIS — J449 Chronic obstructive pulmonary disease, unspecified: Secondary | ICD-10-CM

## 2023-09-04 DIAGNOSIS — E669 Obesity, unspecified: Secondary | ICD-10-CM

## 2023-09-04 DIAGNOSIS — Z6830 Body mass index (BMI) 30.0-30.9, adult: Secondary | ICD-10-CM

## 2023-09-04 DIAGNOSIS — E118 Type 2 diabetes mellitus with unspecified complications: Secondary | ICD-10-CM | POA: Diagnosis not present

## 2023-09-04 DIAGNOSIS — I10 Essential (primary) hypertension: Secondary | ICD-10-CM | POA: Diagnosis not present

## 2023-09-04 LAB — CBC WITH DIFFERENTIAL/PLATELET
Basophils Absolute: 0 10*3/uL (ref 0.0–0.1)
Basophils Relative: 0.4 % (ref 0.0–3.0)
Eosinophils Absolute: 0.1 10*3/uL (ref 0.0–0.7)
Eosinophils Relative: 2.3 % (ref 0.0–5.0)
HCT: 44.9 % (ref 39.0–52.0)
Hemoglobin: 15 g/dL (ref 13.0–17.0)
Lymphocytes Relative: 33.1 % (ref 12.0–46.0)
Lymphs Abs: 1.9 10*3/uL (ref 0.7–4.0)
MCHC: 33.4 g/dL (ref 30.0–36.0)
MCV: 89.1 fl (ref 78.0–100.0)
Monocytes Absolute: 0.6 10*3/uL (ref 0.1–1.0)
Monocytes Relative: 10.8 % (ref 3.0–12.0)
Neutro Abs: 3 10*3/uL (ref 1.4–7.7)
Neutrophils Relative %: 53.4 % (ref 43.0–77.0)
Platelets: 199 10*3/uL (ref 150.0–400.0)
RBC: 5.04 Mil/uL (ref 4.22–5.81)
RDW: 14.3 % (ref 11.5–15.5)
WBC: 5.6 10*3/uL (ref 4.0–10.5)

## 2023-09-04 LAB — LIPID PANEL
Cholesterol: 139 mg/dL (ref 0–200)
HDL: 60.4 mg/dL (ref >39.00)
LDL Cholesterol: 67 mg/dL (ref 0–99)
NonHDL: 78.45
Total CHOL/HDL Ratio: 2
Triglycerides: 59 mg/dL (ref 0.0–149.0)
VLDL: 11.8 mg/dL (ref 0.0–40.0)

## 2023-09-04 LAB — COMPREHENSIVE METABOLIC PANEL
ALT: 23 U/L (ref 0–53)
AST: 26 U/L (ref 0–37)
Albumin: 4.4 g/dL (ref 3.5–5.2)
Alkaline Phosphatase: 45 U/L (ref 39–117)
BUN: 15 mg/dL (ref 6–23)
CO2: 26 meq/L (ref 19–32)
Calcium: 10 mg/dL (ref 8.4–10.5)
Chloride: 104 meq/L (ref 96–112)
Creatinine, Ser: 1.18 mg/dL (ref 0.40–1.50)
GFR: 58.14 mL/min — ABNORMAL LOW (ref >60.00)
Glucose, Bld: 99 mg/dL (ref 70–99)
Potassium: 4.7 meq/L (ref 3.5–5.1)
Sodium: 139 meq/L (ref 135–145)
Total Bilirubin: 0.8 mg/dL (ref 0.2–1.2)
Total Protein: 7.4 g/dL (ref 6.0–8.3)

## 2023-09-04 LAB — HEMOGLOBIN A1C: Hgb A1c MFr Bld: 6.5 % (ref 4.6–6.5)

## 2023-09-04 LAB — TSH: TSH: 0.85 u[IU]/mL (ref 0.35–5.50)
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
## 2023-09-04 NOTE — Patient Instructions (Addendum)
 RSV, Respiratory Syncitial Virus vaccine, Arexvy vaccine at pharmacy.  Shanda Bumps NP Arvella Nigh is a PA Hyman Hopes is a NP  Carbohydrate Counting for Diabetes Mellitus, Adult Carbohydrate counting is a method of keeping track of how many carbohydrates you eat. Eating carbohydrates increases the amount of sugar (glucose) in the blood. Counting how many carbohydrates you eat improves how well you manage your blood glucose. This, in turn, helps you manage your diabetes. Carbohydrates are measured in grams (g) per serving. It is important to know how many carbohydrates (in grams or by serving size) you can have in each meal. This is different for every person. A dietitian can help you make a meal plan and calculate how many carbohydrates you should have at each meal and snack. What foods contain carbohydrates? Carbohydrates are found in the following foods: Grains, such as breads and cereals. Dried beans and soy products. Starchy vegetables, such as potatoes, peas, and corn. Fruit and fruit juices. Milk and yogurt. Sweets and snack foods, such as cake, cookies, candy, chips, and soft drinks. How do I count carbohydrates in foods? There are two ways to count carbohydrates in food. You can read food labels or learn standard serving sizes of foods. You can use either of these methods or a combination of both. Using the Nutrition Facts label The Nutrition Facts list is included on the labels of almost all packaged foods and beverages in the [place]. It includes: The serving size. Information about nutrients in each serving, including the grams of carbohydrate per serving. To use the Nutrition Facts, decide how many servings you will have. Then, multiply the number of servings by the number of carbohydrates per serving. The resulting number is the total grams of carbohydrates that you will be having. Learning the standard serving sizes of foods When you eat carbohydrate foods that are not  packaged or do not include Nutrition Facts on the label, you need to measure the servings in order to count the grams of carbohydrates. Measure the foods that you will eat with a food scale or measuring cup, if needed. Decide how many standard-size servings you will eat. Multiply the number of servings by 15. For foods that contain carbohydrates, one serving equals 15 g of carbohydrates. For example, if you eat 2 cups or 10 oz (300 g) of strawberries, you will have eaten 2 servings and 30 g of carbohydrates (2 servings x 15 g = 30 g). For foods that have more than one food mixed, such as soups and casseroles, you must count the carbohydrates in each food that is included. The following list contains standard serving sizes of common carbohydrate-rich foods. Each of these servings has about 15 g of carbohydrates: 1 slice of bread. 1 six-inch (15 cm) tortilla. ? cup or 2 oz (53 g) cooked rice or pasta.  cup or 3 oz (85 g) cooked or canned, drained and rinsed beans or lentils.  cup or 3 oz (85 g) starchy vegetable, such as peas, corn, or squash.  cup or 4 oz (120 g) hot cereal.  cup or 3 oz (85 g) boiled or mashed potatoes, or  or 3 oz (85 g) of a large baked potato.  cup or 4 fl oz (118 mL) fruit juice. 1 cup or 8 fl oz (237 mL) milk. 1 small or 4 oz (106 g) apple.  or 2 oz (63 g) of a medium banana. 1 cup or 5 oz (150 g) strawberries. 3 cups or 1 oz (28.3  g) popped popcorn. What is an example of carbohydrate counting? To calculate the grams of carbohydrates in this sample meal, follow the steps shown below. Sample meal 3 oz (85 g) chicken breast. ? cup or 4 oz (106 g) brown rice.  cup or 3 oz (85 g) corn. 1 cup or 8 fl oz (237 mL) milk. 1 cup or 5 oz (150 g) strawberries with sugar-free whipped topping. Carbohydrate calculation Identify the foods that contain carbohydrates: Rice. Corn. Milk. Strawberries. Calculate how many servings you have of each food: 2 servings rice. 1  serving corn. 1 serving milk. 1 serving strawberries. Multiply each number of servings by 15 g: 2 servings rice x 15 g = 30 g. 1 serving corn x 15 g = 15 g. 1 serving milk x 15 g = 15 g. 1 serving strawberries x 15 g = 15 g. Add together all of the amounts to find the total grams of carbohydrates eaten: 30 g + 15 g + 15 g + 15 g = 75 g of carbohydrates total. What are tips for following this plan? Shopping Develop a meal plan and then make a shopping list. Buy fresh and frozen vegetables, fresh and frozen fruit, dairy, eggs, beans, lentils, and whole grains. Look at food labels. Choose foods that have more fiber and less sugar. Avoid processed foods and foods with added sugars. Meal planning Aim to have the same number of grams of carbohydrates at each meal and for each snack time. Plan to have regular, balanced meals and snacks. Where to find more information American Diabetes Association: diabetes.org Centers for Disease Control and Prevention: TonerPromos.no Academy of Nutrition and Dietetics: eatright.org Association of Diabetes Care & Education Specialists: diabeteseducator.org Summary Carbohydrate counting is a method of keeping track of how many carbohydrates you eat. Eating carbohydrates increases the amount of sugar (glucose) in your blood. Counting how many carbohydrates you eat improves how well you manage your blood glucose. This helps you manage your diabetes. A dietitian can help you make a meal plan and calculate how many carbohydrates you should have at each meal and snack. This information is not intended to replace advice given to you by your health care provider. Make sure you discuss any questions you have with your health care provider. Document Revised: 01/21/2020 Document Reviewed: 01/22/2020 Elsevier Patient Education  2024 [employer].

## 2023-09-05 ENCOUNTER — Encounter: Payer: Self-pay | Admitting: Family Medicine

## 2023-09-05 LAB — MICROALBUMIN / CREATININE URINE RATIO
Creatinine,U: 35.5 mg/dL
Microalb Creat Ratio: 19.7 mg/g (ref 0.0–30.0)
Microalb, Ur: <0.7 mg/dL (ref 0.0–1.9)

## 2023-09-07 ENCOUNTER — Inpatient Hospital Stay: Payer: Medicare HMO | Admitting: Hematology & Oncology

## 2023-09-07 ENCOUNTER — Inpatient Hospital Stay: Payer: Medicare HMO | Attending: Hematology & Oncology

## 2023-09-27 ENCOUNTER — Other Ambulatory Visit: Payer: Self-pay | Admitting: Family

## 2023-09-27 DIAGNOSIS — I2699 Other pulmonary embolism without acute cor pulmonale: Secondary | ICD-10-CM

## 2023-09-27 DIAGNOSIS — I2609 Other pulmonary embolism with acute cor pulmonale: Secondary | ICD-10-CM

## 2023-09-27 DIAGNOSIS — D6859 Other primary thrombophilia: Secondary | ICD-10-CM

## 2023-09-28 ENCOUNTER — Other Ambulatory Visit: Payer: Self-pay | Admitting: Family

## 2023-10-06 ENCOUNTER — Telehealth: Payer: Self-pay | Admitting: Emergency Medicine

## 2023-10-06 NOTE — Telephone Encounter (Signed)
 Called and spoke with patient's daughter. She said her father doesn't use oxygen. It has been over a year since the request was put in and she's not sure why they resent the request. I faxed the cover sheet back letting the company know, per his daughter, he doesn't need it.

## 2023-10-19 ENCOUNTER — Other Ambulatory Visit: Payer: Self-pay | Admitting: Family Medicine

## 2023-10-22 ENCOUNTER — Other Ambulatory Visit: Payer: Self-pay | Admitting: Family

## 2023-10-22 DIAGNOSIS — I2699 Other pulmonary embolism without acute cor pulmonale: Secondary | ICD-10-CM

## 2023-10-22 DIAGNOSIS — I2609 Other pulmonary embolism with acute cor pulmonale: Secondary | ICD-10-CM

## 2023-10-22 DIAGNOSIS — D6859 Other primary thrombophilia: Secondary | ICD-10-CM

## 2023-11-19 ENCOUNTER — Other Ambulatory Visit: Payer: Self-pay | Admitting: Hematology & Oncology

## 2023-11-19 DIAGNOSIS — D6859 Other primary thrombophilia: Secondary | ICD-10-CM

## 2023-11-19 DIAGNOSIS — I2609 Other pulmonary embolism with acute cor pulmonale: Secondary | ICD-10-CM

## 2023-11-19 DIAGNOSIS — I2699 Other pulmonary embolism without acute cor pulmonale: Secondary | ICD-10-CM

## 2023-11-29 ENCOUNTER — Other Ambulatory Visit: Payer: Self-pay | Admitting: Family Medicine

## 2023-12-06 ENCOUNTER — Other Ambulatory Visit: Payer: Self-pay | Admitting: Family Medicine

## 2023-12-06 MED ORDER — STIOLTO RESPIMAT 2.5-2.5 MCG/ACT IN AERS: 1.0000 | INHALATION_SPRAY | Freq: Every day | RESPIRATORY_TRACT | 3 refills | Status: AC

## 2023-12-06 NOTE — Telephone Encounter (Signed)
 Copied from CRM [id number]. Topic: Clinical - Medication Refill >> Dec 06, 2023 10:07 AM Taleah C wrote: Medication: stiolto  Has the patient contacted their pharmacy? Yes Pharmacist called and said they have requested a refill multiple times since 5/31  This is the patient's preferred pharmacy:  [facility] [address], [place] - [address] [place] [place] [address] Phone: [phone] Fax: [phone]  Is this the correct pharmacy for this prescription? Yes If no, delete pharmacy and type the correct one.   Has the prescription been filled recently? Yes  Is the patient out of the medication? Yes  Has the patient been seen for an appointment in the last year OR does the patient have an upcoming appointment? Yes  Can we respond through MyChart? No  Agent: Please be advised that Rx refills may take up to 3 business days. We ask that you follow-up with your pharmacy.

## 2023-12-06 NOTE — Telephone Encounter (Signed)
 Last Fill: 08/29/23  Last OV: 09/04/23 Next OV: 03/14/24  Routing to provider for review/authorization.

## 2023-12-26 ENCOUNTER — Other Ambulatory Visit: Payer: Self-pay | Admitting: Hematology & Oncology

## 2023-12-26 DIAGNOSIS — D6859 Other primary thrombophilia: Secondary | ICD-10-CM

## 2023-12-26 DIAGNOSIS — I2609 Other pulmonary embolism with acute cor pulmonale: Secondary | ICD-10-CM

## 2023-12-26 DIAGNOSIS — I2699 Other pulmonary embolism without acute cor pulmonale: Secondary | ICD-10-CM

## 2024-01-01 ENCOUNTER — Other Ambulatory Visit: Payer: Self-pay | Admitting: Family Medicine

## 2024-01-01 DIAGNOSIS — R739 Hyperglycemia, unspecified: Secondary | ICD-10-CM

## 2024-01-25 ENCOUNTER — Other Ambulatory Visit: Payer: Self-pay | Admitting: Hematology & Oncology

## 2024-01-25 DIAGNOSIS — I2699 Other pulmonary embolism without acute cor pulmonale: Secondary | ICD-10-CM

## 2024-01-25 DIAGNOSIS — D6859 Other primary thrombophilia: Secondary | ICD-10-CM

## 2024-01-25 DIAGNOSIS — I2609 Other pulmonary embolism with acute cor pulmonale: Secondary | ICD-10-CM

## 2024-01-26 ENCOUNTER — Encounter: Payer: Self-pay | Admitting: Pharmacist

## 2024-01-26 NOTE — Progress Notes (Signed)
 Pharmacy Quality Measure Review  This patient is appearing on a report for being at risk of failing the adherence measure for diabetes medications this calendar year.   Medication: Metformin  500mg   Last fill date: 08/29/2023 for 90 day supply per adherence report  Reviewed recent refill history in Dr Annemarie database. Actual last refill date was 01/01/2024 for 90 day supply. Patient has no refills remaining. Next appointment with PCP is 03/14/2024.    Insurance report was not up to date. No action needed at this time.   Madelin Ray, PharmD Clinical Pharmacist [facility] Primary Care  Population Health [phone]

## 2024-02-14 ENCOUNTER — Encounter: Payer: Self-pay | Admitting: Physician Assistant

## 2024-02-14 ENCOUNTER — Ambulatory Visit (INDEPENDENT_AMBULATORY_CARE_PROVIDER_SITE_OTHER): Admitting: Physician Assistant

## 2024-02-14 VITALS — BP 111/72 | HR 58 | Ht 70.0 in | Wt 217.0 lb

## 2024-02-14 DIAGNOSIS — M109 Gout, unspecified: Secondary | ICD-10-CM | POA: Diagnosis not present

## 2024-02-14 MED ORDER — COLCHICINE 0.6 MG PO TABS: ORAL_TABLET | ORAL | 0 refills | Status: DC

## 2024-02-14 NOTE — Progress Notes (Signed)
 Established patient visit   Patient: Victor Cooper   DOB: 1941/06/05   83 y.o. Male  MRN: [id number] Visit Date: 02/14/2024  Today's healthcare provider: Manuelita Flatness, PA-C   Chief Complaint  Patient presents with   Foot Pain    Left foot.  Started Monday   Subjective     Pt, with a history of gout, presents with left foot pain x 1 week. No preventative tx for gout aside from diet/hydration.   Discussed the use of AI scribe software for clinical note transcription with the patient, who gave verbal consent to proceed.  He has experienced significant pain and swelling in his left foot for approximately one week. Initially, the pain was localized to the side of the foot but has since moved to the middle. The pain is severe enough to wake him from sleep and is exacerbated by movement, such as pointing his toes. The area is swollen and painful when touched. No recent falls, twists, or injuries to the foot.  Medications: Outpatient Medications Prior to Visit  Medication Sig   olmesartan -hydrochlorothiazide  (BENICAR  HCT) 40-25 MG tablet Take 1 tablet by mouth daily.   albuterol  (VENTOLIN  HFA) 108 (90 Base) MCG/ACT inhaler Inhale 2 puffs into the lungs every 6 (six) hours.   amLODipine  (NORVASC ) 10 MG tablet Take 1 tablet (10 mg total) by mouth daily.   benzonatate  (TESSALON ) 100 MG capsule Take 1 capsule (100 mg total) by mouth every 8 (eight) hours.   blood glucose meter kit and supplies KIT Check blood sugar BID , as needed dx: E11.9   ELIQUIS  2.5 MG TABS tablet Take 1 tablet by mouth twice daily   metFORMIN  (GLUCOPHAGE ) 500 MG tablet Take 1 tablet (500 mg total) by mouth daily with breakfast.   rosuvastatin  (CRESTOR ) 20 MG tablet Take 1 tablet (20 mg total) by mouth daily.   Tiotropium Bromide-Olodaterol (STIOLTO RESPIMAT ) 2.5-2.5 MCG/ACT AERS Inhale 1 puff into the lungs daily.   No facility-administered medications prior to visit.    Review of Systems  Constitutional:   Negative for fatigue and fever.  Respiratory:  Negative for cough and shortness of breath.   Cardiovascular:  Negative for chest pain, palpitations and leg swelling.  Musculoskeletal:  Positive for arthralgias.  Neurological:  Negative for dizziness and headaches.       Objective    BP 111/72   Pulse (!) 58   Ht 5' 10 (1.778 m)   Wt 217 lb (98.4 kg)   BMI 31.14 kg/m    Physical Exam Vitals reviewed.  Constitutional:      Appearance: He is not ill-appearing.  HENT:     Head: Normocephalic.  Eyes:     Conjunctiva/sclera: Conjunctivae normal.  Cardiovascular:     Rate and Rhythm: Normal rate.  Pulmonary:     Effort: Pulmonary effort is normal. No respiratory distress.  Musculoskeletal:     Comments: Left midfoot with edema, erythema, significant pain to touch . He has full rom of the foot/toes, but reports pain  Neurological:     Mental Status: He is alert and oriented to person, place, and time.  Psychiatric:        Mood and Affect: Mood normal.        Behavior: Behavior normal.      No results found for any visits on 02/14/24.  Assessment & Plan    Acute gout of left foot, unspecified cause -     Colchicine ; 1.2 mg first dose,  and then 0.6 mg an hour later on the first day. On day 2 and onward, 0.6 mg twice daily  Dispense: 20 tablet; Refill: 0  Tx as gout, pt does not have frequent flares.  Advised colchicine  dosing. CrCl > 60  Fu in office if symptoms not improved within the week  Return if symptoms worsen or fail to improve.       Manuelita Flatness, PA-C  [facility] Primary Care at [facility] [phone] (phone) [phone] (fax)  [facility]

## 2024-02-15 ENCOUNTER — Encounter: Payer: Self-pay | Admitting: Physician Assistant

## 2024-02-27 ENCOUNTER — Other Ambulatory Visit: Payer: Self-pay | Admitting: Hematology & Oncology

## 2024-02-27 DIAGNOSIS — I2609 Other pulmonary embolism with acute cor pulmonale: Secondary | ICD-10-CM

## 2024-02-27 DIAGNOSIS — D6859 Other primary thrombophilia: Secondary | ICD-10-CM

## 2024-02-27 DIAGNOSIS — I2699 Other pulmonary embolism without acute cor pulmonale: Secondary | ICD-10-CM

## 2024-03-11 NOTE — Assessment & Plan Note (Signed)
 Encourage heart healthy diet such as MIND or DASH diet, increase exercise, avoid trans fats, simple carbohydrates and processed foods, consider a krill or fish or flaxseed oil cap daily.

## 2024-03-11 NOTE — Assessment & Plan Note (Signed)
 Well controlled, no changes to meds. Encouraged heart healthy diet such as the DASH diet and exercise as tolerated.

## 2024-03-11 NOTE — Assessment & Plan Note (Signed)
 No recent exacerbation, no changes but did have Flu A in January

## 2024-03-11 NOTE — Assessment & Plan Note (Signed)
 Hydrate and monitor

## 2024-03-14 ENCOUNTER — Encounter: Payer: Self-pay | Admitting: Family Medicine

## 2024-03-14 ENCOUNTER — Ambulatory Visit (HOSPITAL_BASED_OUTPATIENT_CLINIC_OR_DEPARTMENT_OTHER)
Admission: RE | Admit: 2024-03-14 | Discharge: 2024-03-14 | Disposition: A | Discharge status: Home / Self Care | Source: Ambulatory Visit | Attending: Family Medicine | Admitting: Family Medicine

## 2024-03-14 ENCOUNTER — Ambulatory Visit: Admitting: Family Medicine

## 2024-03-14 VITALS — BP 130/82 | HR 61 | Resp 16 | Ht 70.0 in | Wt 216.6 lb

## 2024-03-14 DIAGNOSIS — H547 Unspecified visual loss: Secondary | ICD-10-CM

## 2024-03-14 DIAGNOSIS — E1122 Type 2 diabetes mellitus with diabetic chronic kidney disease: Secondary | ICD-10-CM

## 2024-03-14 DIAGNOSIS — Z8739 Personal history of other diseases of the musculoskeletal system and connective tissue: Secondary | ICD-10-CM

## 2024-03-14 DIAGNOSIS — M7989 Other specified soft tissue disorders: Secondary | ICD-10-CM | POA: Diagnosis not present

## 2024-03-14 DIAGNOSIS — M79672 Pain in left foot: Secondary | ICD-10-CM | POA: Diagnosis not present

## 2024-03-14 DIAGNOSIS — M109 Gout, unspecified: Secondary | ICD-10-CM

## 2024-03-14 DIAGNOSIS — Z794 Long term (current) use of insulin: Secondary | ICD-10-CM

## 2024-03-14 DIAGNOSIS — J449 Chronic obstructive pulmonary disease, unspecified: Secondary | ICD-10-CM

## 2024-03-14 DIAGNOSIS — R739 Hyperglycemia, unspecified: Secondary | ICD-10-CM

## 2024-03-14 DIAGNOSIS — I10 Essential (primary) hypertension: Secondary | ICD-10-CM

## 2024-03-14 DIAGNOSIS — E7800 Pure hypercholesterolemia, unspecified: Secondary | ICD-10-CM | POA: Diagnosis not present

## 2024-03-14 DIAGNOSIS — E118 Type 2 diabetes mellitus with unspecified complications: Secondary | ICD-10-CM

## 2024-03-14 DIAGNOSIS — N1831 Chronic kidney disease, stage 3a: Secondary | ICD-10-CM

## 2024-03-14 LAB — CBC WITH DIFFERENTIAL/PLATELET
Basophils Absolute: 0.1 K/uL (ref 0.0–0.1)
Basophils Relative: 0.8 % (ref 0.0–3.0)
Eosinophils Absolute: 0.1 K/uL (ref 0.0–0.7)
Eosinophils Relative: 1.9 % (ref 0.0–5.0)
HCT: 43.6 % (ref 39.0–52.0)
Hemoglobin: 14.7 g/dL (ref 13.0–17.0)
Lymphocytes Relative: 28.2 % (ref 12.0–46.0)
Lymphs Abs: 2 K/uL (ref 0.7–4.0)
MCHC: 33.8 g/dL (ref 30.0–36.0)
MCV: 87.8 fl (ref 78.0–100.0)
Monocytes Absolute: 0.7 K/uL (ref 0.1–1.0)
Monocytes Relative: 10 % (ref 3.0–12.0)
Neutro Abs: 4.3 K/uL (ref 1.4–7.7)
Neutrophils Relative %: 59.1 % (ref 43.0–77.0)
Platelets: 210 K/uL (ref 150.0–400.0)
RBC: 4.96 Mil/uL (ref 4.22–5.81)
RDW: 14.1 % (ref 11.5–15.5)
WBC: 7.2 K/uL (ref 4.0–10.5)

## 2024-03-14 LAB — COMPREHENSIVE METABOLIC PANEL WITH GFR
ALT: 41 U/L (ref 0–53)
AST: 39 U/L — ABNORMAL HIGH (ref 0–37)
Albumin: 4.5 g/dL (ref 3.5–5.2)
Alkaline Phosphatase: 47 U/L (ref 39–117)
BUN: 18 mg/dL (ref 6–23)
CO2: 27 meq/L (ref 19–32)
Calcium: 10.2 mg/dL (ref 8.4–10.5)
Chloride: 101 meq/L (ref 96–112)
Creatinine, Ser: 1.27 mg/dL (ref 0.40–1.50)
GFR: 53.03 mL/min — ABNORMAL LOW (ref >60.00)
Glucose, Bld: 96 mg/dL (ref 70–99)
Potassium: 4.6 meq/L (ref 3.5–5.1)
Sodium: 137 meq/L (ref 135–145)
Total Bilirubin: 0.8 mg/dL (ref 0.2–1.2)
Total Protein: 7.3 g/dL (ref 6.0–8.3)

## 2024-03-14 LAB — LIPID PANEL
Cholesterol: 140 mg/dL (ref 0–200)
HDL: 55.9 mg/dL (ref >39.00)
LDL Cholesterol: 65 mg/dL (ref 0–99)
NonHDL: 83.87
Total CHOL/HDL Ratio: 3
Triglycerides: 94 mg/dL (ref 0.0–149.0)
VLDL: 18.8 mg/dL (ref 0.0–40.0)

## 2024-03-14 LAB — HEMOGLOBIN A1C: Hgb A1c MFr Bld: 6.8 % — ABNORMAL HIGH (ref 4.6–6.5)

## 2024-03-14 LAB — URIC ACID: Uric Acid, Serum: 8 mg/dL — ABNORMAL HIGH (ref 4.0–7.8)

## 2024-03-14 LAB — TSH: TSH: 0.79 u[IU]/mL (ref 0.35–5.50)

## 2024-03-14 MED ORDER — LANCET DEVICE MISC: 1.0000 | Freq: Three times a day (TID) | 0 refills | Status: AC

## 2024-03-14 MED ORDER — LANCETS MISC. MISC: 1.0000 | Freq: Three times a day (TID) | 2 refills | Status: AC

## 2024-03-14 MED ORDER — COLCHICINE 0.6 MG PO TABS: 0.6000 mg | ORAL_TABLET | Freq: Every day | ORAL | 1 refills | Status: AC

## 2024-03-14 MED ORDER — ALLOPURINOL 100 MG PO TABS: 100.0000 mg | ORAL_TABLET | Freq: Every day | ORAL | 6 refills | Status: AC

## 2024-03-14 MED ORDER — BLOOD GLUCOSE TEST VI STRP: 1.0000 | ORAL_STRIP | Freq: Three times a day (TID) | 2 refills | Status: AC

## 2024-03-14 MED ORDER — BLOOD GLUCOSE MONITORING SUPPL DEVI: 1.0000 | Freq: Three times a day (TID) | 0 refills | Status: AC

## 2024-03-14 NOTE — Assessment & Plan Note (Signed)
 hgba1c acceptable, minimize simple carbs. Increase exercise as tolerated. On Olmesartan, consider statin initiation

## 2024-03-14 NOTE — Patient Instructions (Addendum)
 Annual flu and covid booster RSV, Respiratory Syncitial Virus Vaccine, Arexvy at pharmacy+++++++++++++++++++++++++++    Gout Low-Purine Diet   A low-purine diet involves making choices to limit how much purine you take in. Purine is a kind of uric acid. If you have too much uric acid in your blood, it can cause problems like gout and kidney stones. Eating a low-purine diet can help control those problems. What are tips for following this plan? Shopping Avoid buying things that have high-fructose corn syrup in them. Check for this on food labels. Be sure to check for it in: [employer], such as cookies. Canned fruits. Cereals and cereal bars. Avoid buying meats with a high purine content. These include: Veal. Chicken breast with skin. Lamb. Organ meats, such as liver. Choose dairy products. These may lower uric acid levels. Avoid buying certain types of fish. These include: Anchovies. Trout. Tuna. Sardines. Salmon. Avoid buying drinks with alcohol in them. Alcohol can affect the way your body gets rid of uric acid. Stay away from: [facility]. Hard liquor. Meal planning  Learn which foods do or don't affect you. If you find a food that causes problems, avoid eating that food. If you have any questions about a food item, talk with your health care provider. Eat less meat. When you do eat meat, choose meats that have less purine in them. Eat lots of fruits and vegetables. Even though some vegetables have more purine in them, they don't add as much to the amount of uric acid in your blood as other foods. Have at least one serving of dairy a day. General information If you drink alcohol: Limit how much you have to: 0-1 drink a day if you're male. 0-2 drinks a day if you're male. Know how much alcohol is in your drink. In the [place] one drink is one 12 oz bottle of beer (355 mL), one 5 oz glass of wine (148 mL), or one 1 oz glass of hard liquor (44 mL). Drink more fluids as told.  Fluids can help remove uric acid from your body. Work with your provider to make a plan to help you lose weight or stay at a healthy weight. Losing weight can help lower how much uric acid is in your blood. What foods are recommended? You can have: Fresh or frozen fruits and vegetables. Whole grains, breads, cereals, and pasta. Rice. Beans, peas, and legumes. Nuts and seeds. Dairy products. Fats and oils. The items listed above may not be all the foods and drinks you can have. Talk with an expert in healthy eating called a dietitian to learn more. What foods are not recommended? Limit your intake of foods and drinks high in purines. These include: Beer and other types of alcohol. Meat-based gravy or sauce. Canned or fresh seafood, such as: Anchovies, sardines, herring, salmon, and tuna. Mussels, scallops, shrimp, and crab. Codfish, trout, and haddock. Bacon, veal, chicken breast with skin, and lamb. Organ meats, such as: Liver or kidney. Tripe. Sweetbreads (thymus gland or pancreas). [employer], environmental. Yeast or yeast extract supplements. Drinks that have been made sweeter with high-fructose corn syrup. These include sodas. Processed foods made with high-fructose corn syrup. The items listed above may not be all the foods and drinks you should limit. Talk with a dietitian to learn more. This information is not intended to replace advice given to you by your health care provider. Make sure you discuss any questions you have with your health care provider. Document Revised: 09/23/2023  Document Reviewed: 09/23/2023 Elsevier Patient Education  2025 [employer] Gout is a condition that causes painful swelling of the joints. Gout is a type of inflammation of the joints (arthritis). This condition is caused by having too much uric acid in the body. Uric acid is a chemical that forms when the body breaks down substances called purines. Purines are important for building body  proteins. When the body has too much uric acid, sharp crystals can form and build up inside the joints. This causes pain and swelling. Gout attacks can happen quickly and may be very painful (acute gout). Over time, the attacks can affect more joints and become more frequent (chronic gout). Gout can also cause uric acid to build up under the skin and inside the kidneys. What are the causes? This condition is caused by too much uric acid in your blood. This can happen because: Your kidneys do not remove enough uric acid from your blood. This is the most common cause. Your body makes too much uric acid. This can happen with some cancers and cancer treatments. It can also occur if your body is breaking down too many red blood cells (hemolytic anemia). You eat too many foods that are high in purines. These foods include organ meats and some seafood. Alcohol, especially beer, is also high in purines. A gout attack may be triggered by trauma or stress. What increases the risk? The following factors may make you more likely to develop this condition: Having a family history of gout. Being male and middle-aged. Being male and having gone through menopause. Taking certain medicines, including aspirin , cyclosporine, diuretics, levodopa, and niacin. Having an organ transplant. Having certain conditions, such as: Being obese. Lead poisoning. Kidney disease. A skin condition called psoriasis. Other factors include: Losing weight too quickly. Being dehydrated. Frequently drinking alcohol, especially beer. Frequently drinking beverages that are sweetened with a type of sugar called fructose. What are the signs or symptoms? An attack of acute gout happens quickly. It usually occurs in just one joint. The most common place is the big toe. Attacks often start at night. Other joints that may be affected include joints of the feet, ankle, knee, fingers, wrist, or elbow. Symptoms of this condition may  include: Severe pain. Warmth. Swelling. Stiffness. Tenderness. The affected joint may be very painful to touch. Shiny, red, or purple skin. Chills and fever. Chronic gout may cause symptoms more frequently. More joints may be involved. You may also have white or yellow lumps (tophi) on your hands or feet or in other areas near your joints. How is this diagnosed? This condition is diagnosed based on your symptoms, your medical history, and a physical exam. You may have tests, such as: Blood tests to measure uric acid levels. Removal of joint fluid with a thin needle (aspiration) to look for uric acid crystals. X-rays to look for joint damage. How is this treated? Treatment for this condition has two phases: treating an acute attack and preventing future attacks. Acute gout treatment may include medicines to reduce pain and swelling, including: NSAIDs, such as ibuprofen. Steroids. These are strong anti-inflammatory medicines that can be taken by mouth (orally) or injected into a joint. Colchicine . This medicine relieves pain and swelling when it is taken soon after an attack. It can be given by mouth or through an IV. Preventive treatment may include: Daily use of smaller doses of NSAIDs or colchicine . Use of a medicine that reduces uric acid levels in your blood, such as allopurinol .  Changes to your diet. You may need to see a dietitian about what to eat and drink to prevent gout. Follow these instructions at home: During a gout attack  If directed, put ice on the affected area. To do this: Put ice in a plastic bag. Place a towel between your skin and the bag. Leave the ice on for 20 minutes, 2-3 times a day. Remove the ice if your skin turns bright red. This is very important. If you cannot feel pain, heat, or cold, you have a greater risk of damage to the area. Raise (elevate) the affected joint above the level of your heart as often as possible. Rest the joint as much as possible.  If the affected joint is in your leg, you may be given crutches to use. Follow instructions from your health care provider about eating or drinking restrictions. Avoiding future gout attacks Follow a low-purine diet as told by your dietitian or health care provider. Avoid foods and drinks that are high in purines, including liver, kidney, anchovies, asparagus, herring, mushrooms, mussels, and beer. Maintain a healthy weight or lose weight if you are overweight. If you want to lose weight, talk with your health care provider. Do not lose weight too quickly. Start or maintain an exercise program as told by your health care provider. Eating and drinking Avoid drinking beverages that contain fructose. Drink enough fluids to keep your urine pale yellow. If you drink alcohol: Limit how much you have to: 0-1 drink a day for women who are not pregnant. 0-2 drinks a day for men. Know how much alcohol is in a drink. In the [place] one drink equals one 12 oz bottle of beer (355 mL), one 5 oz glass of wine (148 mL), or one 1 oz glass of hard liquor (44 mL). General instructions Take over-the-counter and prescription medicines only as told by your health care provider. Ask your health care provider if the medicine prescribed to you requires you to avoid driving or using machinery. Return to your normal activities as told by your health care provider. Ask your health care provider what activities are safe for you. Keep all follow-up visits. This is important. Where to find more information [employer]: www.niams.[email or web address].myers.net/ Contact a health care provider if you have: Another gout attack. Continuing symptoms of a gout attack after 10 days of treatment. Side effects from your medicines. Chills or a fever. Burning pain when you urinate. Pain in your lower back or abdomen. Get help right away if you: Have severe or uncontrolled pain. Cannot urinate. Summary Gout is painful swelling of the  joints caused by having too much uric acid in the body. The most common site for gout to occur is in the big toe, but it can affect other joints in the body. Medicines and dietary changes can help to prevent and treat gout attacks. This information is not intended to replace advice given to you by your health care provider. Make sure you discuss any questions you have with your health care provider. Document Revised: 03/24/2021 Document Reviewed: 03/24/2021 Elsevier Patient Education  2024 [employer].
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
## 2024-03-17 ENCOUNTER — Ambulatory Visit: Payer: Self-pay | Admitting: Family Medicine

## 2024-03-18 ENCOUNTER — Other Ambulatory Visit: Payer: Self-pay | Admitting: Family Medicine

## 2024-03-18 DIAGNOSIS — R739 Hyperglycemia, unspecified: Secondary | ICD-10-CM

## 2024-03-24 ENCOUNTER — Other Ambulatory Visit: Payer: Self-pay | Admitting: Hematology & Oncology

## 2024-03-24 DIAGNOSIS — I2699 Other pulmonary embolism without acute cor pulmonale: Secondary | ICD-10-CM

## 2024-03-24 DIAGNOSIS — D6859 Other primary thrombophilia: Secondary | ICD-10-CM

## 2024-03-24 DIAGNOSIS — I2609 Other pulmonary embolism with acute cor pulmonale: Secondary | ICD-10-CM

## 2024-04-03 NOTE — Progress Notes (Signed)
 OSKER AYOUB                                          MRN: [id number]   04/03/2024   The [facility] Quality Team Specialist reviewed this patient medical record for the purposes of chart review for care gap closure. The following were reviewed: abstraction for care gap closure-kidney health evaluation for diabetes:eGFR  and uACR.    [facility] Quality Team

## 2024-04-23 ENCOUNTER — Other Ambulatory Visit: Payer: Self-pay | Admitting: Hematology & Oncology

## 2024-04-23 DIAGNOSIS — I2609 Other pulmonary embolism with acute cor pulmonale: Secondary | ICD-10-CM

## 2024-04-23 DIAGNOSIS — D6859 Other primary thrombophilia: Secondary | ICD-10-CM

## 2024-04-23 DIAGNOSIS — I2699 Other pulmonary embolism without acute cor pulmonale: Secondary | ICD-10-CM

## 2024-05-18 ENCOUNTER — Other Ambulatory Visit: Payer: Self-pay | Admitting: Hematology & Oncology

## 2024-05-18 DIAGNOSIS — I2699 Other pulmonary embolism without acute cor pulmonale: Secondary | ICD-10-CM

## 2024-05-18 DIAGNOSIS — D6859 Other primary thrombophilia: Secondary | ICD-10-CM

## 2024-05-18 DIAGNOSIS — I2609 Other pulmonary embolism with acute cor pulmonale: Secondary | ICD-10-CM

## 2024-05-23 ENCOUNTER — Other Ambulatory Visit: Payer: Self-pay | Admitting: Family Medicine

## 2024-06-17 ENCOUNTER — Other Ambulatory Visit: Payer: Self-pay | Admitting: Hematology & Oncology

## 2024-06-17 DIAGNOSIS — I2699 Other pulmonary embolism without acute cor pulmonale: Secondary | ICD-10-CM

## 2024-06-17 DIAGNOSIS — I2609 Other pulmonary embolism with acute cor pulmonale: Secondary | ICD-10-CM

## 2024-06-17 DIAGNOSIS — D6859 Other primary thrombophilia: Secondary | ICD-10-CM

## 2024-07-19 ENCOUNTER — Other Ambulatory Visit: Payer: Self-pay | Admitting: Hematology & Oncology

## 2024-07-19 DIAGNOSIS — D6859 Other primary thrombophilia: Secondary | ICD-10-CM

## 2024-07-19 DIAGNOSIS — I2609 Other pulmonary embolism with acute cor pulmonale: Secondary | ICD-10-CM

## 2024-07-19 DIAGNOSIS — I2699 Other pulmonary embolism without acute cor pulmonale: Secondary | ICD-10-CM

## 2024-08-22 ENCOUNTER — Ambulatory Visit: Payer: Medicare Other

## 2024-09-04 ENCOUNTER — Encounter: Admitting: Student
